# Patient Record
Sex: Female | Born: 1970 | Race: White | Hispanic: No | Smoking: Former smoker
Health system: Southern US, Community
[De-identification: ages and names within clinical notes are randomized; demographics above are authoritative.]

## PROBLEM LIST (undated history)

## (undated) DIAGNOSIS — M1990 Unspecified osteoarthritis, unspecified site: Secondary | ICD-10-CM

## (undated) DIAGNOSIS — R112 Nausea with vomiting, unspecified: Secondary | ICD-10-CM

## (undated) DIAGNOSIS — M329 Systemic lupus erythematosus, unspecified: Secondary | ICD-10-CM

## (undated) DIAGNOSIS — R011 Cardiac murmur, unspecified: Secondary | ICD-10-CM

## (undated) DIAGNOSIS — R55 Syncope and collapse: Secondary | ICD-10-CM

## (undated) DIAGNOSIS — F1120 Opioid dependence, uncomplicated: Secondary | ICD-10-CM

## (undated) DIAGNOSIS — N390 Urinary tract infection, site not specified: Principal | ICD-10-CM

## (undated) DIAGNOSIS — F419 Anxiety disorder, unspecified: Secondary | ICD-10-CM

## (undated) DIAGNOSIS — R000 Tachycardia, unspecified: Secondary | ICD-10-CM

## (undated) DIAGNOSIS — B019 Varicella without complication: Secondary | ICD-10-CM

## (undated) DIAGNOSIS — F4310 Post-traumatic stress disorder, unspecified: Secondary | ICD-10-CM

## (undated) DIAGNOSIS — R51 Headache: Secondary | ICD-10-CM

## (undated) DIAGNOSIS — I10 Essential (primary) hypertension: Secondary | ICD-10-CM

## (undated) DIAGNOSIS — Z9889 Other specified postprocedural states: Secondary | ICD-10-CM

## (undated) DIAGNOSIS — F410 Panic disorder [episodic paroxysmal anxiety] without agoraphobia: Secondary | ICD-10-CM

## (undated) DIAGNOSIS — F329 Major depressive disorder, single episode, unspecified: Secondary | ICD-10-CM

## (undated) HISTORY — DX: Tachycardia, unspecified: R00.0

## (undated) HISTORY — DX: Panic disorder (episodic paroxysmal anxiety): F41.0

## (undated) HISTORY — DX: Cardiac murmur, unspecified: R01.1

## (undated) HISTORY — DX: Varicella without complication: B01.9

## (undated) HISTORY — DX: Post-traumatic stress disorder, unspecified: F43.10

## (undated) HISTORY — DX: Urinary tract infection, site not specified: N39.0

## (undated) HISTORY — DX: Headache: R51

## (undated) HISTORY — DX: Anxiety disorder, unspecified: F41.9

## (undated) HISTORY — DX: Major depressive disorder, single episode, unspecified: F32.9

## (undated) HISTORY — DX: Systemic lupus erythematosus, unspecified: M32.9

## (undated) HISTORY — DX: Opioid dependence, uncomplicated: F11.20

## (undated) HISTORY — DX: Essential (primary) hypertension: I10

---

## 1993-01-30 HISTORY — PX: BREAST SURGERY: SHX581

## 1997-09-20 ENCOUNTER — Emergency Department (HOSPITAL_COMMUNITY): Admission: EM | Admit: 1997-09-20 | Discharge: 1997-09-20 | Payer: Self-pay | Admitting: Emergency Medicine

## 1999-02-16 ENCOUNTER — Inpatient Hospital Stay (HOSPITAL_COMMUNITY): Admission: AD | Admit: 1999-02-16 | Discharge: 1999-02-18 | Payer: Self-pay | Admitting: Psychiatry

## 1999-11-03 ENCOUNTER — Ambulatory Visit (HOSPITAL_COMMUNITY): Admission: RE | Admit: 1999-11-03 | Discharge: 1999-11-03 | Payer: Self-pay | Admitting: Family Medicine

## 2000-01-31 DIAGNOSIS — R55 Syncope and collapse: Secondary | ICD-10-CM

## 2000-01-31 HISTORY — DX: Syncope and collapse: R55

## 2000-01-31 HISTORY — PX: TONSILLECTOMY AND ADENOIDECTOMY: SHX28

## 2001-02-01 ENCOUNTER — Encounter: Payer: Self-pay | Admitting: Family Medicine

## 2001-02-01 ENCOUNTER — Ambulatory Visit (HOSPITAL_COMMUNITY): Admission: RE | Admit: 2001-02-01 | Discharge: 2001-02-01 | Payer: Self-pay | Admitting: Family Medicine

## 2002-01-30 DIAGNOSIS — M329 Systemic lupus erythematosus, unspecified: Secondary | ICD-10-CM

## 2002-01-30 HISTORY — DX: Systemic lupus erythematosus, unspecified: M32.9

## 2002-10-15 ENCOUNTER — Encounter: Payer: Self-pay | Admitting: Gastroenterology

## 2002-10-15 ENCOUNTER — Encounter: Admission: RE | Admit: 2002-10-15 | Discharge: 2002-10-15 | Payer: Self-pay | Admitting: Gastroenterology

## 2002-11-05 ENCOUNTER — Emergency Department (HOSPITAL_COMMUNITY): Admission: EM | Admit: 2002-11-05 | Discharge: 2002-11-06 | Payer: Self-pay | Admitting: Emergency Medicine

## 2002-11-14 ENCOUNTER — Ambulatory Visit (HOSPITAL_COMMUNITY): Admission: RE | Admit: 2002-11-14 | Discharge: 2002-11-15 | Payer: Self-pay | Admitting: Surgery

## 2002-11-14 ENCOUNTER — Encounter (INDEPENDENT_AMBULATORY_CARE_PROVIDER_SITE_OTHER): Payer: Self-pay | Admitting: *Deleted

## 2002-11-14 ENCOUNTER — Encounter: Payer: Self-pay | Admitting: Surgery

## 2003-01-31 HISTORY — PX: CHOLECYSTECTOMY: SHX55

## 2004-06-07 ENCOUNTER — Inpatient Hospital Stay (HOSPITAL_COMMUNITY): Admission: AC | Admit: 2004-06-07 | Discharge: 2004-06-09 | Payer: Self-pay

## 2004-07-19 ENCOUNTER — Ambulatory Visit: Payer: Self-pay | Admitting: Internal Medicine

## 2004-07-26 ENCOUNTER — Ambulatory Visit: Payer: Self-pay | Admitting: Internal Medicine

## 2004-07-26 ENCOUNTER — Ambulatory Visit (HOSPITAL_COMMUNITY): Admission: RE | Admit: 2004-07-26 | Discharge: 2004-07-26 | Payer: Self-pay | Admitting: Internal Medicine

## 2004-08-18 ENCOUNTER — Ambulatory Visit: Payer: Self-pay | Admitting: Internal Medicine

## 2004-08-20 ENCOUNTER — Emergency Department (HOSPITAL_COMMUNITY): Admission: EM | Admit: 2004-08-20 | Discharge: 2004-08-21 | Payer: Self-pay | Admitting: Emergency Medicine

## 2005-01-08 ENCOUNTER — Emergency Department (HOSPITAL_COMMUNITY): Admission: AD | Admit: 2005-01-08 | Discharge: 2005-01-08 | Payer: Self-pay | Admitting: Family Medicine

## 2005-04-08 ENCOUNTER — Emergency Department (HOSPITAL_COMMUNITY): Admission: EM | Admit: 2005-04-08 | Discharge: 2005-04-08 | Payer: Self-pay | Admitting: Emergency Medicine

## 2005-04-15 ENCOUNTER — Emergency Department (HOSPITAL_COMMUNITY): Admission: EM | Admit: 2005-04-15 | Discharge: 2005-04-15 | Payer: Self-pay | Admitting: Family Medicine

## 2005-06-25 ENCOUNTER — Inpatient Hospital Stay (HOSPITAL_COMMUNITY): Admission: EM | Admit: 2005-06-25 | Discharge: 2005-06-28 | Payer: Self-pay | Admitting: Psychiatry

## 2005-06-25 ENCOUNTER — Emergency Department (HOSPITAL_COMMUNITY): Admission: EM | Admit: 2005-06-25 | Discharge: 2005-06-25 | Payer: Self-pay | Admitting: Emergency Medicine

## 2005-06-26 ENCOUNTER — Ambulatory Visit: Payer: Self-pay | Admitting: Psychiatry

## 2005-10-26 ENCOUNTER — Ambulatory Visit: Payer: Self-pay | Admitting: Cardiology

## 2005-11-07 ENCOUNTER — Ambulatory Visit: Payer: Self-pay | Admitting: Internal Medicine

## 2005-11-07 ENCOUNTER — Ambulatory Visit: Payer: Self-pay

## 2005-11-22 ENCOUNTER — Ambulatory Visit: Payer: Self-pay | Admitting: Internal Medicine

## 2005-11-29 ENCOUNTER — Ambulatory Visit (HOSPITAL_BASED_OUTPATIENT_CLINIC_OR_DEPARTMENT_OTHER): Admission: RE | Admit: 2005-11-29 | Discharge: 2005-11-29 | Payer: Self-pay | Admitting: *Deleted

## 2006-01-30 HISTORY — PX: OTHER SURGICAL HISTORY: SHX169

## 2007-08-21 ENCOUNTER — Inpatient Hospital Stay (HOSPITAL_COMMUNITY): Admission: EM | Admit: 2007-08-21 | Discharge: 2007-08-24 | Payer: Self-pay | Admitting: Emergency Medicine

## 2008-01-31 HISTORY — PX: ABDOMINAL HYSTERECTOMY: SHX81

## 2008-06-23 ENCOUNTER — Emergency Department (HOSPITAL_BASED_OUTPATIENT_CLINIC_OR_DEPARTMENT_OTHER): Admission: EM | Admit: 2008-06-23 | Discharge: 2008-06-23 | Payer: Self-pay | Admitting: Emergency Medicine

## 2008-06-23 ENCOUNTER — Ambulatory Visit: Payer: Self-pay | Admitting: Radiology

## 2008-11-18 ENCOUNTER — Emergency Department (HOSPITAL_BASED_OUTPATIENT_CLINIC_OR_DEPARTMENT_OTHER): Admission: EM | Admit: 2008-11-18 | Discharge: 2008-11-19 | Payer: Self-pay | Admitting: Emergency Medicine

## 2008-12-24 ENCOUNTER — Emergency Department (HOSPITAL_COMMUNITY): Admission: EM | Admit: 2008-12-24 | Discharge: 2008-12-24 | Payer: Self-pay | Admitting: Emergency Medicine

## 2009-01-11 ENCOUNTER — Ambulatory Visit (HOSPITAL_COMMUNITY): Admission: RE | Admit: 2009-01-11 | Discharge: 2009-01-12 | Payer: Self-pay | Admitting: Obstetrics and Gynecology

## 2009-01-23 ENCOUNTER — Inpatient Hospital Stay (HOSPITAL_COMMUNITY): Admission: AD | Admit: 2009-01-23 | Discharge: 2009-01-23 | Payer: Self-pay | Admitting: Obstetrics and Gynecology

## 2009-03-05 ENCOUNTER — Ambulatory Visit: Payer: Self-pay | Admitting: Diagnostic Radiology

## 2009-03-05 ENCOUNTER — Emergency Department (HOSPITAL_BASED_OUTPATIENT_CLINIC_OR_DEPARTMENT_OTHER): Admission: EM | Admit: 2009-03-05 | Discharge: 2009-03-05 | Payer: Self-pay | Admitting: Emergency Medicine

## 2009-05-07 ENCOUNTER — Emergency Department (HOSPITAL_BASED_OUTPATIENT_CLINIC_OR_DEPARTMENT_OTHER): Admission: EM | Admit: 2009-05-07 | Discharge: 2009-05-07 | Payer: Self-pay | Admitting: Emergency Medicine

## 2009-08-14 ENCOUNTER — Inpatient Hospital Stay (HOSPITAL_COMMUNITY): Admission: EM | Admit: 2009-08-14 | Discharge: 2009-08-17 | Payer: Self-pay | Admitting: Emergency Medicine

## 2009-11-02 ENCOUNTER — Emergency Department (HOSPITAL_BASED_OUTPATIENT_CLINIC_OR_DEPARTMENT_OTHER): Admission: EM | Admit: 2009-11-02 | Discharge: 2009-11-02 | Payer: Self-pay | Admitting: Emergency Medicine

## 2009-11-02 ENCOUNTER — Ambulatory Visit: Payer: Self-pay | Admitting: Diagnostic Radiology

## 2009-11-14 ENCOUNTER — Encounter: Admission: RE | Admit: 2009-11-14 | Discharge: 2009-11-14 | Payer: Self-pay | Admitting: Neurological Surgery

## 2010-01-04 ENCOUNTER — Emergency Department (HOSPITAL_COMMUNITY)
Admission: EM | Admit: 2010-01-04 | Discharge: 2010-01-04 | Payer: Self-pay | Source: Home / Self Care | Admitting: Emergency Medicine

## 2010-02-19 ENCOUNTER — Encounter: Payer: Self-pay | Admitting: Rheumatology

## 2010-02-20 ENCOUNTER — Encounter: Payer: Self-pay | Admitting: Rheumatology

## 2010-03-25 ENCOUNTER — Emergency Department (HOSPITAL_BASED_OUTPATIENT_CLINIC_OR_DEPARTMENT_OTHER)
Admission: EM | Admit: 2010-03-25 | Discharge: 2010-03-25 | Disposition: A | Discharge status: Home / Self Care | Payer: BC Managed Care – PPO | Attending: Emergency Medicine | Admitting: Emergency Medicine

## 2010-03-25 DIAGNOSIS — R509 Fever, unspecified: Secondary | ICD-10-CM | POA: Insufficient documentation

## 2010-03-25 DIAGNOSIS — J3489 Other specified disorders of nose and nasal sinuses: Secondary | ICD-10-CM | POA: Insufficient documentation

## 2010-03-25 DIAGNOSIS — M2550 Pain in unspecified joint: Secondary | ICD-10-CM | POA: Insufficient documentation

## 2010-03-25 DIAGNOSIS — H9210 Otorrhea, unspecified ear: Secondary | ICD-10-CM | POA: Insufficient documentation

## 2010-03-25 DIAGNOSIS — R059 Cough, unspecified: Secondary | ICD-10-CM | POA: Insufficient documentation

## 2010-03-25 DIAGNOSIS — M329 Systemic lupus erythematosus, unspecified: Secondary | ICD-10-CM | POA: Insufficient documentation

## 2010-03-25 DIAGNOSIS — R05 Cough: Secondary | ICD-10-CM | POA: Insufficient documentation

## 2010-03-25 DIAGNOSIS — F3289 Other specified depressive episodes: Secondary | ICD-10-CM | POA: Insufficient documentation

## 2010-03-25 DIAGNOSIS — F329 Major depressive disorder, single episode, unspecified: Secondary | ICD-10-CM | POA: Insufficient documentation

## 2010-03-25 DIAGNOSIS — H9190 Unspecified hearing loss, unspecified ear: Secondary | ICD-10-CM | POA: Insufficient documentation

## 2010-03-25 DIAGNOSIS — Z79899 Other long term (current) drug therapy: Secondary | ICD-10-CM | POA: Insufficient documentation

## 2010-03-25 DIAGNOSIS — H9209 Otalgia, unspecified ear: Secondary | ICD-10-CM | POA: Insufficient documentation

## 2010-03-25 DIAGNOSIS — H7290 Unspecified perforation of tympanic membrane, unspecified ear: Secondary | ICD-10-CM | POA: Insufficient documentation

## 2010-03-25 DIAGNOSIS — J329 Chronic sinusitis, unspecified: Secondary | ICD-10-CM | POA: Insufficient documentation

## 2010-04-11 LAB — URINALYSIS, ROUTINE W REFLEX MICROSCOPIC
Bilirubin Urine: NEGATIVE
Hgb urine dipstick: NEGATIVE
Ketones, ur: NEGATIVE mg/dL
Nitrite: NEGATIVE
Urobilinogen, UA: 1 mg/dL (ref 0.0–1.0)
pH: 6.5 (ref 5.0–8.0)

## 2010-04-11 LAB — COMPREHENSIVE METABOLIC PANEL
AST: 44 U/L — ABNORMAL HIGH (ref 0–37)
Albumin: 4 g/dL (ref 3.5–5.2)
Alkaline Phosphatase: 61 U/L (ref 39–117)
Chloride: 105 mEq/L (ref 96–112)
GFR calc Af Amer: >60 mL/min (ref >60)
Potassium: 4.1 mEq/L (ref 3.5–5.1)
Total Bilirubin: 0.5 mg/dL (ref 0.3–1.2)

## 2010-04-11 LAB — CBC
Platelets: 179 10*3/uL (ref 150–400)
RBC: 4.07 MIL/uL (ref 3.87–5.11)
WBC: 6.3 10*3/uL (ref 4.0–10.5)

## 2010-04-11 LAB — DIFFERENTIAL
Basophils Absolute: 0 10*3/uL (ref 0.0–0.1)
Basophils Relative: 0 % (ref 0–1)
Eosinophils Relative: 1 % (ref 0–5)
Lymphocytes Relative: 30 % (ref 12–46)
Monocytes Absolute: 0.6 10*3/uL (ref 0.1–1.0)

## 2010-04-16 LAB — COMPREHENSIVE METABOLIC PANEL
AST: 19 U/L (ref 0–37)
AST: 21 U/L (ref 0–37)
Albumin: 3.1 g/dL — ABNORMAL LOW (ref 3.5–5.2)
Alkaline Phosphatase: 49 U/L (ref 39–117)
BUN: 10 mg/dL (ref 6–23)
BUN: 5 mg/dL — ABNORMAL LOW (ref 6–23)
CO2: 25 mEq/L (ref 19–32)
Calcium: 9 mg/dL (ref 8.4–10.5)
Chloride: 111 mEq/L (ref 96–112)
Creatinine, Ser: 0.69 mg/dL (ref 0.4–1.2)
Creatinine, Ser: 0.74 mg/dL (ref 0.4–1.2)
GFR calc Af Amer: >60 mL/min (ref >60)
GFR calc Af Amer: >60 mL/min (ref >60)
GFR calc non Af Amer: >60 mL/min (ref >60)
Glucose, Bld: 109 mg/dL — ABNORMAL HIGH (ref 70–99)
Potassium: 3.9 mEq/L (ref 3.5–5.1)
Total Bilirubin: 0.4 mg/dL (ref 0.3–1.2)
Total Bilirubin: 0.6 mg/dL (ref 0.3–1.2)
Total Protein: 5.4 g/dL — ABNORMAL LOW (ref 6.0–8.3)

## 2010-04-16 LAB — URINALYSIS, ROUTINE W REFLEX MICROSCOPIC
Bilirubin Urine: NEGATIVE
Hgb urine dipstick: NEGATIVE
Ketones, ur: NEGATIVE mg/dL
Nitrite: NEGATIVE
pH: 5 (ref 5.0–8.0)

## 2010-04-16 LAB — CBC
HCT: 36.6 % (ref 36.0–46.0)
Hemoglobin: 12.5 g/dL (ref 12.0–15.0)
Hemoglobin: 13.4 g/dL (ref 12.0–15.0)
MCH: 31.3 pg (ref 26.0–34.0)
MCH: 31.4 pg (ref 26.0–34.0)
MCHC: 34.1 g/dL (ref 30.0–36.0)
MCHC: 34.1 g/dL (ref 30.0–36.0)
MCV: 92.1 fL (ref 78.0–100.0)
Platelets: 190 10*3/uL (ref 150–400)
RBC: 3.69 MIL/uL — ABNORMAL LOW (ref 3.87–5.11)
RDW: 13.3 % (ref 11.5–15.5)
RDW: 13.6 % (ref 11.5–15.5)
WBC: 5.4 10*3/uL (ref 4.0–10.5)

## 2010-04-16 LAB — DIFFERENTIAL
Basophils Absolute: 0 10*3/uL (ref 0.0–0.1)
Lymphocytes Relative: 25 % (ref 12–46)
Lymphs Abs: 1.1 10*3/uL (ref 0.7–4.0)
Neutro Abs: 2.9 10*3/uL (ref 1.7–7.7)
Neutrophils Relative %: 67 % (ref 43–77)

## 2010-04-16 LAB — BASIC METABOLIC PANEL
BUN: 7 mg/dL (ref 6–23)
CO2: 24 mEq/L (ref 19–32)
GFR calc non Af Amer: >60 mL/min (ref >60)
Glucose, Bld: 95 mg/dL (ref 70–99)
Potassium: 4.5 mEq/L (ref 3.5–5.1)
Sodium: 139 mEq/L (ref 135–145)

## 2010-04-16 LAB — POCT PREGNANCY, URINE: Preg Test, Ur: NEGATIVE

## 2010-04-16 LAB — LIPASE, BLOOD: Lipase: 27 U/L (ref 11–59)

## 2010-04-20 LAB — COMPREHENSIVE METABOLIC PANEL
Albumin: 5 g/dL (ref 3.5–5.2)
Alkaline Phosphatase: 60 U/L (ref 39–117)
BUN: 14 mg/dL (ref 6–23)
CO2: 28 mEq/L (ref 19–32)
Chloride: 101 mEq/L (ref 96–112)
Creatinine, Ser: 0.8 mg/dL (ref 0.4–1.2)
GFR calc non Af Amer: >60 mL/min (ref >60)
Glucose, Bld: 76 mg/dL (ref 70–99)
Potassium: 4.2 mEq/L (ref 3.5–5.1)
Total Bilirubin: 0.4 mg/dL (ref 0.3–1.2)

## 2010-04-20 LAB — CBC
HCT: 41.4 % (ref 36.0–46.0)
Hemoglobin: 14.1 g/dL (ref 12.0–15.0)
MCV: 92.5 fL (ref 78.0–100.0)
Platelets: 231 10*3/uL (ref 150–400)
RBC: 4.48 MIL/uL (ref 3.87–5.11)
WBC: 8.8 10*3/uL (ref 4.0–10.5)

## 2010-04-20 LAB — DIFFERENTIAL
Basophils Absolute: 0.1 10*3/uL (ref 0.0–0.1)
Basophils Relative: 1 % (ref 0–1)
Eosinophils Relative: 0 % (ref 0–5)
Monocytes Absolute: 0.7 10*3/uL (ref 0.1–1.0)
Neutro Abs: 5.9 10*3/uL (ref 1.7–7.7)

## 2010-04-20 LAB — URINE MICROSCOPIC-ADD ON

## 2010-04-20 LAB — URINALYSIS, ROUTINE W REFLEX MICROSCOPIC
Bilirubin Urine: NEGATIVE
Glucose, UA: NEGATIVE mg/dL
Hgb urine dipstick: NEGATIVE
Ketones, ur: NEGATIVE mg/dL
Protein, ur: NEGATIVE mg/dL
Urobilinogen, UA: 0.2 mg/dL (ref 0.0–1.0)

## 2010-04-20 LAB — URINE CULTURE

## 2010-04-20 LAB — POCT CARDIAC MARKERS
Myoglobin, poc: 67.4 ng/mL (ref 12–200)
Troponin i, poc: <0.05 ng/mL (ref 0.00–0.09)

## 2010-04-20 LAB — CK: Total CK: 112 U/L (ref 7–177)

## 2010-04-24 ENCOUNTER — Emergency Department (HOSPITAL_COMMUNITY)
Admission: EM | Admit: 2010-04-24 | Discharge: 2010-04-25 | Disposition: A | Discharge status: Home / Self Care | Payer: BC Managed Care – PPO | Attending: Emergency Medicine | Admitting: Emergency Medicine

## 2010-04-24 DIAGNOSIS — R12 Heartburn: Secondary | ICD-10-CM | POA: Insufficient documentation

## 2010-04-24 DIAGNOSIS — F329 Major depressive disorder, single episode, unspecified: Secondary | ICD-10-CM | POA: Insufficient documentation

## 2010-04-24 DIAGNOSIS — F3289 Other specified depressive episodes: Secondary | ICD-10-CM | POA: Insufficient documentation

## 2010-04-24 DIAGNOSIS — R1032 Left lower quadrant pain: Secondary | ICD-10-CM | POA: Insufficient documentation

## 2010-04-24 DIAGNOSIS — N949 Unspecified condition associated with female genital organs and menstrual cycle: Secondary | ICD-10-CM | POA: Insufficient documentation

## 2010-04-24 DIAGNOSIS — M329 Systemic lupus erythematosus, unspecified: Secondary | ICD-10-CM | POA: Insufficient documentation

## 2010-04-24 DIAGNOSIS — R110 Nausea: Secondary | ICD-10-CM | POA: Insufficient documentation

## 2010-04-24 LAB — BASIC METABOLIC PANEL
CO2: 24 mEq/L (ref 19–32)
Calcium: 8.9 mg/dL (ref 8.4–10.5)
Chloride: 108 mEq/L (ref 96–112)
GFR calc Af Amer: >60 mL/min (ref >60)
Sodium: 137 mEq/L (ref 135–145)

## 2010-04-24 LAB — URINALYSIS, ROUTINE W REFLEX MICROSCOPIC
Bilirubin Urine: NEGATIVE
Ketones, ur: NEGATIVE mg/dL
Nitrite: NEGATIVE
Urobilinogen, UA: 0.2 mg/dL (ref 0.0–1.0)

## 2010-04-24 LAB — DIFFERENTIAL
Basophils Absolute: 0 10*3/uL (ref 0.0–0.1)
Basophils Relative: 0 % (ref 0–1)
Eosinophils Absolute: 0.1 10*3/uL (ref 0.0–0.7)
Eosinophils Relative: 1 % (ref 0–5)
Monocytes Absolute: 0.7 10*3/uL (ref 0.1–1.0)

## 2010-04-24 LAB — PREGNANCY, URINE: Preg Test, Ur: NEGATIVE

## 2010-04-24 LAB — CBC
Hemoglobin: 13 g/dL (ref 12.0–15.0)
RBC: 4.25 MIL/uL (ref 3.87–5.11)
WBC: 6.9 10*3/uL (ref 4.0–10.5)

## 2010-04-25 ENCOUNTER — Emergency Department (HOSPITAL_COMMUNITY): Payer: BC Managed Care – PPO

## 2010-04-25 LAB — OCCULT BLOOD, POC DEVICE: Fecal Occult Bld: NEGATIVE

## 2010-04-25 LAB — WET PREP, GENITAL
Clue Cells Wet Prep HPF POC: NONE SEEN
Trich, Wet Prep: NONE SEEN

## 2010-04-26 LAB — GC/CHLAMYDIA PROBE AMP, GENITAL: GC Probe Amp, Genital: NEGATIVE

## 2010-05-02 LAB — URINE CULTURE: Colony Count: 35000

## 2010-05-02 LAB — URINALYSIS, ROUTINE W REFLEX MICROSCOPIC
Bilirubin Urine: NEGATIVE
Specific Gravity, Urine: 1.02 (ref 1.005–1.030)
Urobilinogen, UA: 0.2 mg/dL (ref 0.0–1.0)

## 2010-05-02 LAB — DIFFERENTIAL
Eosinophils Absolute: 0 10*3/uL (ref 0.0–0.7)
Lymphocytes Relative: 24 % (ref 12–46)
Lymphs Abs: 2.7 10*3/uL (ref 0.7–4.0)
Monocytes Relative: 10 % (ref 3–12)
Neutro Abs: 7.3 10*3/uL (ref 1.7–7.7)
Neutrophils Relative %: 66 % (ref 43–77)

## 2010-05-02 LAB — CBC
MCV: 92.6 fL (ref 78.0–100.0)
RBC: 3.86 MIL/uL — ABNORMAL LOW (ref 3.87–5.11)
WBC: 11.2 10*3/uL — ABNORMAL HIGH (ref 4.0–10.5)

## 2010-05-02 LAB — URINE MICROSCOPIC-ADD ON

## 2010-05-03 LAB — CBC
HCT: 28.2 % — ABNORMAL LOW (ref 36.0–46.0)
Hemoglobin: 9.5 g/dL — ABNORMAL LOW (ref 12.0–15.0)
Platelets: 211 10*3/uL (ref 150–400)
Platelets: 269 10*3/uL (ref 150–400)
RDW: 15.6 % — ABNORMAL HIGH (ref 11.5–15.5)
WBC: 11.9 10*3/uL — ABNORMAL HIGH (ref 4.0–10.5)

## 2010-05-03 LAB — COMPREHENSIVE METABOLIC PANEL
ALT: 36 U/L — ABNORMAL HIGH (ref 0–35)
Albumin: 4.4 g/dL (ref 3.5–5.2)
Alkaline Phosphatase: 56 U/L (ref 39–117)
Calcium: 9.6 mg/dL (ref 8.4–10.5)
Potassium: 4 mEq/L (ref 3.5–5.1)
Sodium: 137 mEq/L (ref 135–145)
Total Protein: 7.8 g/dL (ref 6.0–8.3)

## 2010-05-03 LAB — TYPE AND SCREEN: ABO/RH(D): A POS

## 2010-05-03 LAB — ABO/RH: ABO/RH(D): A POS

## 2010-05-04 LAB — CBC
HCT: 33.2 % — ABNORMAL LOW (ref 36.0–46.0)
Hemoglobin: 11.2 g/dL — ABNORMAL LOW (ref 12.0–15.0)
MCHC: 33.7 g/dL (ref 30.0–36.0)
MCV: 91.8 fL (ref 78.0–100.0)
RBC: 3.61 MIL/uL — ABNORMAL LOW (ref 3.87–5.11)
RDW: 15.1 % (ref 11.5–15.5)

## 2010-05-04 LAB — DIFFERENTIAL
Basophils Absolute: 0 10*3/uL (ref 0.0–0.1)
Eosinophils Relative: 0 % (ref 0–5)
Lymphocytes Relative: 31 % (ref 12–46)
Lymphs Abs: 1.9 10*3/uL (ref 0.7–4.0)
Neutro Abs: 3.7 10*3/uL (ref 1.7–7.7)
Neutrophils Relative %: 61 % (ref 43–77)

## 2010-05-04 LAB — URINALYSIS, ROUTINE W REFLEX MICROSCOPIC
Hgb urine dipstick: NEGATIVE
Protein, ur: NEGATIVE mg/dL
Specific Gravity, Urine: 1.023 (ref 1.005–1.030)
Urobilinogen, UA: 1 mg/dL (ref 0.0–1.0)

## 2010-05-04 LAB — COMPREHENSIVE METABOLIC PANEL
ALT: 28 U/L (ref 0–35)
BUN: 9 mg/dL (ref 6–23)
CO2: 23 mEq/L (ref 19–32)
Calcium: 8.6 mg/dL (ref 8.4–10.5)
Creatinine, Ser: 0.64 mg/dL (ref 0.4–1.2)
GFR calc non Af Amer: >60 mL/min (ref >60)
Glucose, Bld: 93 mg/dL (ref 70–99)
Sodium: 138 mEq/L (ref 135–145)
Total Protein: 6.4 g/dL (ref 6.0–8.3)

## 2010-05-04 LAB — LIPASE, BLOOD: Lipase: 20 U/L (ref 11–59)

## 2010-05-04 LAB — GC/CHLAMYDIA PROBE AMP, GENITAL: GC Probe Amp, Genital: NEGATIVE

## 2010-05-04 LAB — WET PREP, GENITAL
Trich, Wet Prep: NONE SEEN
Yeast Wet Prep HPF POC: NONE SEEN

## 2010-05-05 LAB — PREGNANCY, URINE: Preg Test, Ur: NEGATIVE

## 2010-05-10 LAB — COMPREHENSIVE METABOLIC PANEL
ALT: 36 U/L — ABNORMAL HIGH (ref 0–35)
AST: 41 U/L — ABNORMAL HIGH (ref 0–37)
Alkaline Phosphatase: 103 U/L (ref 39–117)
CO2: 26 mEq/L (ref 19–32)
Calcium: 9.4 mg/dL (ref 8.4–10.5)
GFR calc Af Amer: >60 mL/min (ref >60)
GFR calc non Af Amer: >60 mL/min (ref >60)
Potassium: 3.7 mEq/L (ref 3.5–5.1)
Sodium: 143 mEq/L (ref 135–145)
Total Protein: 9.3 g/dL — ABNORMAL HIGH (ref 6.0–8.3)

## 2010-05-10 LAB — DIFFERENTIAL
Basophils Relative: 0 % (ref 0–1)
Eosinophils Absolute: 0 10*3/uL (ref 0.0–0.7)
Eosinophils Relative: 1 % (ref 0–5)
Lymphs Abs: 2.7 10*3/uL (ref 0.7–4.0)
Monocytes Relative: 10 % (ref 3–12)

## 2010-05-10 LAB — URINALYSIS, ROUTINE W REFLEX MICROSCOPIC
Bilirubin Urine: NEGATIVE
Hgb urine dipstick: NEGATIVE
Specific Gravity, Urine: 1.004 — ABNORMAL LOW (ref 1.005–1.030)
pH: 6.5 (ref 5.0–8.0)

## 2010-05-10 LAB — CBC
MCHC: 33.3 g/dL (ref 30.0–36.0)
RBC: 4.51 MIL/uL (ref 3.87–5.11)
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
## 2010-06-14 NOTE — Procedures (Signed)
EEG NUMBER:  [id number].   CLINICAL HISTORY:  The patient is a 40 year old admitted with syncope.  She has lupus, cardiomegaly, syncope, and also complained of inability  to move her right arm.  (780.2)   PROCEDURE:  The tracing is carried out on a 32-channel digital Cadwell  recorder reformatted into 16 channel montages with one devoted to EKG.  The patient was awake and drowsy during the recording.  The  international 10/20 system lead placement was used. Medications include  Toradol, morphine, Lovenox, aspirin, Protonix, methadone, Toprol,  Plaquenil, Phenergan, Zofran, Ambien, Xanax, and Robitussin.   DESCRIPTION OF FINDINGS:  Dominant frequency is an 8 Hz, 30-60 microvolt  alpha range activity, this is well modulated and regulated.  Background  activity is mixed frequency predominately alpha and beta range  components.  The patient becomes drowsy with mixed frequency theta range  activity and frontally predominant beta range components.  Light natural  sleep was not achieved.   Photic stimulation induced a driving response between 9 and 13 Hz.   EKG showed a regular sinus rhythm with ventricular response of 90 beats  per minute.   IMPRESSION:  Normal record with the patient awake and drowsy.      Deanna Artis. Sharene Skeans, M.D.  Electronically Signed     ZOX:WRUE  D:  08/22/2007 23:39:30  T:  08/23/2007 10:49:29  Job #:  [id number]   cc:   [facility]
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
## 2010-06-17 NOTE — Assessment & Plan Note (Signed)
[facility]                           ELECTROPHYSIOLOGY OFFICE NOTE   FIORELA, PELZER                  MRN:          [id number]  DATE:11/22/2005                            DOB:          19-Dec-1970    HISTORY OF PRESENT ILLNESS:  [name] returns today for follow up.  She  is a very pleasant 40 year old woman with history of syncope and negative  tilt table test who also has fairly severe hypertension.  She was seen in  our office back last month with palpitations and chest discomfort as well as  elevation in her blood pressure.  She underwent multiple testing including  cardiac monitoring which demonstrated sinus tach versus atrial tach at rates  of up to 130 beats per minute but also demonstrated an average heart rate of  79 beats per minute.  There were no long pauses.  The patient underwent,  because of her prolonged and severe hypertension, evaluation with 24 hour  metanephrine's and catecholamines which demonstrated no evidence of a pheo.  The patient finally underwent stress perfusion imaging which demonstrated no  ischemia and normal LV function.   PHYSICAL EXAMINATION:  GENERAL:  Pleasant, well-appearing woman in no acute  distress.  VITAL SIGNS:  Blood pressure 128/86, pulse 76 and regular, respirations 18,  weight 149 pounds.  NECK:  No JVD.  LUNGS:  Clear to bilaterally to auscultation.  CARDIOVASCULAR:  Regular rate and rhythm with normal S1, S2.  EXTREMITIES:  No cyanosis, clubbing or edema.  Pulses were 2+ and symmetric.   STUDIES:  EKG demonstrates sinus rhythm with normal axis and intervals.   IMPRESSION:  1. Syncope, almost certainly due to a neurally mediated source.  2. Hypertension, well controlled now on Toprol.  3. Palpitations with probable sinus tachycardia.   DISCUSSION:  I discussed the benign nature of the problems with her, and  that should she feel dizzy or lightheaded, she should sit down  immediately.  For now, she will continue on her  present medical therapy.  If she has recurrent problems with syncope, then I  would be happy to share back.  Otherwise, we will see her back in one year.            ______________________________  Doylene Canning. Ladona Ridgel, MD   GWT/MedQ DD:  11/22/2005 DT:  11/23/2005 Job #:  [id number]   cc:   Kirk Ruths, M.D.

## 2010-06-17 NOTE — Op Note (Signed)
Rebecca Boyle, Rebecca Boyle           ACCOUNT NO.:  [id number]   MEDICAL RECORD NO.:  [id number]          PATIENT TYPE:  AMB   LOCATION:  DSC                          FACILITY:  [facility]   PHYSICIAN:  Lowell Bouton, M.D.DATE OF BIRTH:  [birth year]   DATE OF PROCEDURE:  11/29/2005  DATE OF DISCHARGE:                                 OPERATIVE REPORT   PREOPERATIVE DIAGNOSIS:  Ulnar nerve laceration right wrist.   POSTOPERATIVE DIAGNOSIS:  Contusion ulnar nerve right wrist.   PROCEDURE:  External epineurolysis ulnar nerve right wrist.   SURGEON:  Lowell Bouton, M.D.   ANESTHESIA:  General.   OPERATIVE FINDINGS:  The patient had a small hematoma in the epineurium of  the ulnar nerve without division of any fascicles.  The level was at the  distal forearm and wrist.  The ulnar artery was intact, as were all the  flexor tendons in the region.   PROCEDURE:  Under general anesthesia, with a tourniquet on the right arm,  the right hand was prepped and draped in the usual fashion. After  exsanguinating the limb, the tourniquet was inflated to 250 mmHg.  A  longitudinal gently curved incision was made over the ulnar side of the  wrist and carried through the subcutaneous tissues.  Bleeding points were  coagulated.  Blunt dissection was carried down to the flexor carpi ulnaris  tendon which was retracted ulnarly.  There was no evidence of puncture of  the tendon.  The ulnar artery and nerve were then examined, and the artery  was intact.  The ulnar nerve was completely intact, although there was one  small area of hematoma where the nail had apparently touched the nerve.  The  nerve was then retracted, and the tendons were examined, and the fingers  were flexed and extended, and there was no evidence of perforation of the  tendons.  The microscope was then brought into the field, and the nerve was  examined, and the external epineurium was released.  The fascicles were all  intact, and there was no revision of any of the fascicles.  The wound was  then irrigated with saline.  One-half percent Marcaine was inserted in the  skin edges for pain control.  The subcutaneous tissue was closed with 4-0  Vicryl, and the skin with a 3-0 subcuticular Prolene.  Steri-Strips were  applied, followed by sterile dressings and a volar wrist splint. The patient  tolerated the procedure well and went to the recovery room, awake in stable  and condition.      Lowell Bouton, M.D.  Electronically Signed     EMM/MEDQ  D:  11/29/2005  T:  11/29/2005  Job:  [id number]   cc:   Patrica Duel, M.D.

## 2010-06-17 NOTE — Op Note (Signed)
NAME:  Rebecca Boyle, Rebecca Boyle                     ACCOUNT NO.:  [id number]   MEDICAL RECORD NO.:  [id number]                   PATIENT TYPE:  EMS   LOCATION:  MAJO                                 FACILITY:  [facility]   PHYSICIAN:  Abigail Miyamoto, M.D.              DATE OF BIRTH:  02/09/1970   DATE OF PROCEDURE:  11/14/2002  DATE OF DISCHARGE:  11/06/2002                                 OPERATIVE REPORT   PREOPERATIVE DIAGNOSIS:  Biliary dyskinesia.   POSTOPERATIVE DIAGNOSIS:  Biliary dyskinesia.   PROCEDURE:  Laparoscopic cholecystectomy with intraoperative cholangiogram.   SURGEON:  Abigail Miyamoto, M.D.   ANESTHESIA:  General endotracheal anesthesia and 0.25% Marcaine with  epinephrine.   ESTIMATED BLOOD LOSS:  Minimal.   FINDINGS:  The patient was found to have a normal cholangiogram.   DESCRIPTION OF PROCEDURE:  The patient was brought to the operating room and  identified as Rebecca Boyle.  She was placed supine on the operating  table and general anesthesia was induced.  Her abdomen was then prepped and  draped in the usual sterile fashion.  Using a #15 blade, a small vertical  incision was made below the umbilicus.  The incision was carried down to the  fascia which was opened with the scalpel and the scalpel was used to pass  through the peritoneal cavity.  Next, a 0 Vicryl pursestring suture was then  placed around the fascial opening.  This Hasson port was placed through the  open, and insufflation of the abdomen was begun.  Next, an 12 mm port was  placed in the patient's epigastrium and two 5 mm ports were placed in the  patient's right flank under direct vision.  The gallbladder was then and  retracted below the liver bed.  Dissection was then carried out at the base  of the gallbladder.  The cystic duct and cystic artery were easily dissected  out.  The cystic duct was clipped once distally.  The cystic artery was  clipped three times proximally and once  distally and transected with the  scissors.  The cystic duct was then partly opened with the laparoscopic  scissors.  The [facility] was then inserted straight through the  skin in the right upper quadrant.  The catheter was then placed through the  opening in the cystic duct.  A cholangiogram was then performed under direct  fluoroscopy with contrast.  Contrast was seen to flow into the entire  biliary system and duodenum without evidence of abnormality.  The  cholangiocatheter was then completely removed.  The cystic duct was then  clipped four times proximally and transected with the scissors.  The  gallbladder was then slowly  dissected free from the liver bed with the electrocautery.  Hemostasis was  then achieved in the liver bed with the electrocautery.  Once the  gallbladder was freed from the liver bed it was placed in the [facility] and  removed  through the incision at the umbilicus.  The 0 Vicryl at the  umbilicus was tied in place closing the fascial defect.  The abdomen was  then irrigated with normal saline.  Again hemostasis appeared to be  achieved.  All ports were then removed under direct vision, and the abdomen  was deflated.  All incisions were then anesthetized with 0.25%  Marcaine and closed with 4-0 Vicryl subcuticular sutures. Steri-Strips,  gauze and tape were then applied.  The patient tolerated the procedure well.  All sponge, needle and instrument counts were correct at the end of the  procedure.  The patient was then extubated in the operating room and taken  in stable condition to the recovery room.                                               Abigail Miyamoto, M.D.    DB/MEDQ  D:  11/14/2002  T:  11/14/2002  Job:  [id number]   cc:   Anselmo Rod, M.D.  [address]  [address]  [place]  [place] [address]  Fax: [phone]   Grandville Silos. [name], M.D.  [address] [place]  [place]  [place]  [address]  Fax: [phone]
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
## 2010-06-17 NOTE — H&P (Signed)
Rebecca Boyle, Rebecca Boyle           ACCOUNT NO.:  [id number]   MEDICAL RECORD NO.:  [id number]          PATIENT TYPE:  IPS   LOCATION:  0507                          [facility]ACILITY[facility]   PHYSICIAN:  Geoffery Lyons, M.D.      DATE OF BIRTH:  [birth year]   DATE OF ADMISSION:  06/25/2005  DATE OF DISCHARGE:                         PSYCHIATRIC ADMISSION ASSESSMENT   IDENTIFYING INFORMATION:  The patient is a 40 year old married white female  voluntarily admitted on Jun 25, 2005.   HISTORY OF PRESENT ILLNESS:  The patient presents with a history of  depression, passive suicidal thoughts.  The patient feels that she is a  burden to her family, she realized, due to her illness.  She states she is  in bed much of the time, again reporting chronic pain and relies on her two  children.  She states the 23 year old daughter snapped.  They had found  the patient's heart medicines in her child's room and child is currently  hospitalized for depression.  The patient states she has not been sleeping  for several nights in a row.  Her appetite has been satisfactory.  She does  report that she has been compliant with her medications.   PAST PSYCHIATRIC HISTORY:  This is the patient's second admission.  She was  here a few years ago for depressive symptoms.  Denies any suicidal attempts.  Sees Dr. Tomasa Rand for outpatient mental health services.   SOCIAL HISTORY:  This is a 40 year old married white female.  Married for 10  years.  Divorced her husband and remarried her husband again.  They have two  children, a 11 year old and a 55 year old.  She lives with her husband and  two children.  Unemployed.  Denies any legal issues.   FAMILY HISTORY:  Her mother is bipolar.   ALCOHOL/DRUG HISTORY:  The patient smokes.  Denies any drug use and is a  nondrinker.   PRIMARY CARE PHYSICIAN:  Dr. Lynda Rainwater, who is her rheumatologist in  [place].  The patient also attends the [facility].   MEDICAL PROBLEMS:  Lupus diagnosed in 2005.  She also has problems with  tachycardia.   MEDICATIONS:  Has been on Plaquenil, Toprol XL 50 mg, Depakote 500 mg, 2 at  bedtime, Ambien 10 mg q.h.s., Lamictal (unknown dose) and Xanax 0.5 mg to 1  mg t.i.d. p.r.n.  The patient also was on oxycodone 5 mg up to six times a  day.   ALLERGIES:  No known allergies.   PHYSICAL EXAMINATION:  The patient was assessed at [facility] Emergency  Department.  This is a young female, currently resting in bed, in no acute  distress.  Temperature 97.5, heart rate 90, respirations 18, blood pressure  103/62.  She is 164 pounds.   REVIEW OF SYSTEMS:  Significant for insomnia, depression, suicidal ideation  and chronic pain.  Also positive for increased heart rate.   LABORATORY DATA:  Her urine drug screen was positive for benzodiazepines.  CMET is within normal limits.  Pregnancy test was negative.  Urinalysis  shows 3-6 white blood cells.   MENTAL STATUS EXAM:  She is alert.  Again, resting in bed.  Cooperative.  Good eye contact.  Speech is clear, normal rate and tone.  The patient feels  guilty over her situation.  Affect is flat.  Thought processes are coherent,  goal directed.  No evidence of psychosis.  Cognitive function is intact.  Memory is good.  Judgment is good.  Insight is fair.   DIAGNOSES:  AXIS I:  Bipolar disorder, depressed.  AXIS II:  Deferred.  AXIS III:  Lupus, tachycardia.  AXIS IV:  Problems with primary support group, medical problems, other  psychosocial problems.  AXIS V:  Current 35.   PLAN:  To stabilize mood and thinking.  Will resume patient's medications.  Will check a Depakote level.  We will have a family session.  The patient to  follow up with Dr. Tomasa Rand and her therapist and also her pain clinic for  ongoing health issues.   TENTATIVE LENGTH OF STAY:  Five to six days.      Landry Corporal, N.P.      Geoffery Lyons, M.D.  Electronically Signed     JO/MEDQ  D:  06/28/2005  T:  06/28/2005  Job:  [id number]

## 2010-07-16 ENCOUNTER — Emergency Department (HOSPITAL_COMMUNITY): Payer: BC Managed Care – PPO

## 2010-07-16 ENCOUNTER — Emergency Department (HOSPITAL_COMMUNITY)
Admission: EM | Admit: 2010-07-16 | Discharge: 2010-07-17 | Disposition: A | Discharge status: Home / Self Care | Payer: BC Managed Care – PPO | Attending: Emergency Medicine | Admitting: Emergency Medicine

## 2010-07-16 DIAGNOSIS — R1031 Right lower quadrant pain: Secondary | ICD-10-CM | POA: Insufficient documentation

## 2010-07-16 DIAGNOSIS — N940 Mittelschmerz: Secondary | ICD-10-CM | POA: Insufficient documentation

## 2010-07-16 DIAGNOSIS — M329 Systemic lupus erythematosus, unspecified: Secondary | ICD-10-CM | POA: Insufficient documentation

## 2010-07-16 LAB — CBC
HCT: 42.3 % (ref 36.0–46.0)
Hemoglobin: 14.7 g/dL (ref 12.0–15.0)
MCH: 31.1 pg (ref 26.0–34.0)
MCV: 89.4 fL (ref 78.0–100.0)
RBC: 4.73 MIL/uL (ref 3.87–5.11)

## 2010-07-16 LAB — POCT I-STAT, CHEM 8
BUN: 11 mg/dL (ref 6–23)
Calcium, Ion: 1.08 mmol/L — ABNORMAL LOW (ref 1.12–1.32)
Creatinine, Ser: 0.7 mg/dL (ref 0.50–1.10)
Sodium: 139 mEq/L (ref 135–145)
TCO2: 23 mmol/L (ref 0–100)

## 2010-07-16 LAB — DIFFERENTIAL
Lymphocytes Relative: 39 % (ref 12–46)
Lymphs Abs: 4 10*3/uL (ref 0.7–4.0)
Monocytes Relative: 9 % (ref 3–12)
Neutro Abs: 5.3 10*3/uL (ref 1.7–7.7)
Neutrophils Relative %: 51 % (ref 43–77)

## 2010-07-16 LAB — URINALYSIS, ROUTINE W REFLEX MICROSCOPIC
Glucose, UA: NEGATIVE mg/dL
Hgb urine dipstick: NEGATIVE
Protein, ur: NEGATIVE mg/dL

## 2010-07-17 LAB — POCT PREGNANCY, URINE: Preg Test, Ur: NEGATIVE

## 2010-07-17 MED ORDER — IOHEXOL 300 MG/ML  SOLN
125.0000 mL | Freq: Once | INTRAMUSCULAR | Status: AC | PRN
  Administered 2010-07-17: 125 mL via INTRAVENOUS

## 2010-08-04 ENCOUNTER — Emergency Department (HOSPITAL_COMMUNITY)
Admission: EM | Admit: 2010-08-04 | Discharge: 2010-08-04 | Disposition: A | Discharge status: Home / Self Care | Payer: BC Managed Care – PPO | Attending: Emergency Medicine | Admitting: Emergency Medicine

## 2010-08-04 DIAGNOSIS — R109 Unspecified abdominal pain: Secondary | ICD-10-CM | POA: Insufficient documentation

## 2010-08-04 DIAGNOSIS — F329 Major depressive disorder, single episode, unspecified: Secondary | ICD-10-CM | POA: Insufficient documentation

## 2010-08-04 DIAGNOSIS — F3289 Other specified depressive episodes: Secondary | ICD-10-CM | POA: Insufficient documentation

## 2010-08-04 DIAGNOSIS — K625 Hemorrhage of anus and rectum: Secondary | ICD-10-CM | POA: Insufficient documentation

## 2010-08-04 DIAGNOSIS — M329 Systemic lupus erythematosus, unspecified: Secondary | ICD-10-CM | POA: Insufficient documentation

## 2010-08-04 LAB — CBC
HCT: 41.7 % (ref 36.0–46.0)
Hemoglobin: 14.1 g/dL (ref 12.0–15.0)
MCV: 90.3 fL (ref 78.0–100.0)
RBC: 4.62 MIL/uL (ref 3.87–5.11)
WBC: 7.6 10*3/uL (ref 4.0–10.5)

## 2010-08-04 LAB — URINALYSIS, ROUTINE W REFLEX MICROSCOPIC
Bilirubin Urine: NEGATIVE
Glucose, UA: NEGATIVE mg/dL
Hgb urine dipstick: NEGATIVE
Specific Gravity, Urine: 1.024 (ref 1.005–1.030)
pH: 5.5 (ref 5.0–8.0)

## 2010-08-04 LAB — COMPREHENSIVE METABOLIC PANEL
ALT: 66 U/L — ABNORMAL HIGH (ref 0–35)
Alkaline Phosphatase: 67 U/L (ref 39–117)
BUN: 6 mg/dL (ref 6–23)
Chloride: 103 mEq/L (ref 96–112)
GFR calc Af Amer: >60 mL/min (ref >60)
Glucose, Bld: 86 mg/dL (ref 70–99)
Potassium: 4.1 mEq/L (ref 3.5–5.1)
Total Bilirubin: 0.2 mg/dL — ABNORMAL LOW (ref 0.3–1.2)

## 2010-08-04 LAB — DIFFERENTIAL
Eosinophils Relative: 1 % (ref 0–5)
Lymphocytes Relative: 36 % (ref 12–46)
Lymphs Abs: 2.7 10*3/uL (ref 0.7–4.0)
Neutrophils Relative %: 52 % (ref 43–77)

## 2010-08-04 LAB — OCCULT BLOOD, POC DEVICE: Fecal Occult Bld: NEGATIVE

## 2010-08-15 ENCOUNTER — Encounter (HOSPITAL_COMMUNITY): Payer: Self-pay

## 2010-08-15 ENCOUNTER — Other Ambulatory Visit (HOSPITAL_COMMUNITY): Payer: BC Managed Care – PPO

## 2010-08-15 ENCOUNTER — Encounter (HOSPITAL_COMMUNITY)
Admission: RE | Admit: 2010-08-15 | Discharge: 2010-08-15 | Disposition: A | Discharge status: Home / Self Care | Payer: BC Managed Care – PPO | Source: Ambulatory Visit | Attending: Obstetrics and Gynecology | Admitting: Obstetrics and Gynecology

## 2010-08-15 HISTORY — DX: Other specified postprocedural states: R11.2

## 2010-08-15 HISTORY — DX: Syncope and collapse: R55

## 2010-08-15 HISTORY — DX: Unspecified osteoarthritis, unspecified site: M19.90

## 2010-08-15 HISTORY — DX: Other specified postprocedural states: Z98.890

## 2010-08-15 LAB — CBC
MCH: 30.8 pg (ref 26.0–34.0)
MCHC: 33.2 g/dL (ref 30.0–36.0)
MCV: 92.9 fL (ref 78.0–100.0)
Platelets: 205 10*3/uL (ref 150–400)
RDW: 13.4 % (ref 11.5–15.5)

## 2010-08-15 LAB — SURGICAL PCR SCREEN: MRSA, PCR: NEGATIVE

## 2010-08-15 NOTE — Anesthesia Preprocedure Evaluation (Addendum)
Anesthesia Evaluation  Name, MR# and DOB Patient awake  General Assessment Comment  Reviewed: Allergy & Precautions, H&P  and Patient's Chart, lab work & pertinent test results  Airway Mallampati: I TM Distance: >3 FB     Dental  (+) Teeth Intact   Pulmonaryneg pulmonary ROS      pulmonary exam normal   Cardiovascular Regular Normal   Neuro/PsychNegative Neurological ROS   GI/Hepatic/Renal negative GI ROS, negative Liver ROS, and negative Renal ROS (+)       Endo/Other  Negative Endocrine ROS (+)   Abdominal Normal abdominal exam  (+)   Musculoskeletal negative musculoskeletal ROS (+)  Hematology negative hematology ROS (+)   Peds negative pediatric ROS (+)  Reproductive/Obstetrics negative OB ROS   Anesthesia Other Findings             Anesthesia Physical Anesthesia Plan  ASA: II  Anesthesia Plan: General   Post-op Pain Management:    Induction: Intravenous  Airway Management Planned: Oral ETT  Additional Equipment:   Intra-op Plan:   Post-operative Plan: Extubation in OR  Informed Consent: I have reviewed the patients History and Physical, chart, labs and discussed the procedure including the risks, benefits and alternatives for the proposed anesthesia with the patient or authorized representative who has indicated his/her understanding and acceptance.   Dental advisory given  Plan Discussed with:   Anesthesia Plan Comments: (Patient takes Toprol for history of syncope with + tilt test. Also has history of PONV will get triple rx)        Anesthesia Quick Evaluation

## 2010-08-15 NOTE — H&P (Signed)
  DENE LANDSBERG  DICTATION # [id number] CSN# [id number]   Meriel Pica, MD 08/15/2010 2:36 PM

## 2010-08-15 NOTE — Patient Instructions (Addendum)
20 Rebecca Boyle  08/15/2010   Your procedure is scheduled on:  7/18 - Wednesday  Report to [facility] at 0645 AM.  Call this number if you have problems the morning of surgery: [phone]   Remember:   Do not eat food:After Midnight.  Do not drink clear liquids: After Midnight.  Take these medicines the morning of surgery with A SIP OF WATER: toprol   Do not wear jewelry, make-up or nail polish.  Do not bring valuables to the hospital.  Contacts, dentures or bridgework may not be worn into surgery.  Leave suitcase in the car. After surgery it may be brought to your room.  For patients admitted to the hospital, checkout time is 11:00 AM the day of discharge.   Patients discharged the day of surgery will not be allowed to drive home.  Name and phone number of your driver: Cyndi Lennert [phone]  Special Instructions: N/A

## 2010-08-16 NOTE — H&P (Signed)
NAME:  Rebecca Boyle, Rebecca Boyle                ACCOUNT NO.:  MEDICAL RECORD NO.:  [id number]  LOCATION:                                 FACILITY:  PHYSICIAN:  Duke Salvia. Marcelle Overlie, M.D.DATE OF BIRTH:  [birth year]  DATE OF ADMISSION: DATE OF DISCHARGE:                             HISTORY & PHYSICAL   CHIEF COMPLAINT:  Right lower quadrant pain.  HISTORY OF PRESENT ILLNESS:  A 40 year old who had a prior LAVH RSO December 2010, for menorrhagia and a left ovarian cyst.  Findings at that time demonstrated the right tube and ovary were completely normal and that ovary was conserved.  She has done reasonably well until the last 6 months.  She has been experiencing more chronic right lower quadrant pain.  She presented to the ED that lasted long recently and had a CT performed that showed biliary duct dilatation related to prior cholecystectomy.  There were no other abnormalities noted.  It was felt that the appendix was normal. No other abnormalities were noted.  This patient has required narcotics for relief of her pain.  The possibility of adnexal adhesions was discussed with her.  She presents now for definitive RSO.  Procedure including risks related to bleeding, infection, adjacent organ injury, possible need for open additional surgery, possibility of requiring appendectomy at that time if periappendiceal adhesions involving the right tube and ovary all reviewed with her which she understands and accepts.  PAST MEDICAL HISTORY:  Allergies:  MORPHINE.  CURRENT MEDICATIONS:  Toprol, Plaquenil, Lexapro, Nexium, oxycodone.  REVIEW OF SYSTEMS:  Significant for lupus mainly with skin manifestations.  OBSTETRICAL HISTORY:  She is a G2, P2.  PAST SURGICAL HISTORY:  Prior cholecystectomy and LAVH LSO.  SOCIAL HISTORY:  She is separated.  Denies alcohol, tobacco, or drug use.  REVIEW OF SYSTEMS:  Significant for lupus.  PHYSICAL EXAMINATION:  VITAL SIGNS:  Temperature 98.2, blood  pressure 132/84. HEENT:  Unremarkable. NECK:  Supple, without masses. LUNGS:  Clear. CARDIOVASCULAR:  Regular rate and rhythm without murmurs, rubs, or gallops. BREASTS:  Without masses. ABDOMEN:  Soft, flat, nontender. PELVIC:  Normal external genitalia.  Vaginal cuff was clear.  Bimanual revealed slightly full on the right side.  No definite mass. EXTREMITIES AND NEUROLOGIC:  Unremarkable.  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  PLAN:  Laparoscopy with RSO, possible open RSO procedure and risks reviewed as above.     Aryona Sill M. Marcelle Overlie, M.D.     RMH/MEDQ  D:  08/15/2010  T:  08/16/2010  Job:  [id number]

## 2010-08-17 ENCOUNTER — Encounter (HOSPITAL_COMMUNITY): Payer: Self-pay | Admitting: Certified Registered"

## 2010-08-17 ENCOUNTER — Ambulatory Visit (HOSPITAL_COMMUNITY)
Admission: RE | Admit: 2010-08-17 | Discharge: 2010-08-17 | Disposition: A | Discharge status: Home / Self Care | Payer: BC Managed Care – PPO | Source: Ambulatory Visit | Attending: Obstetrics and Gynecology | Admitting: Obstetrics and Gynecology

## 2010-08-17 ENCOUNTER — Encounter (HOSPITAL_COMMUNITY): Payer: Self-pay | Admitting: Anesthesiology

## 2010-08-17 ENCOUNTER — Ambulatory Visit (HOSPITAL_COMMUNITY): Payer: BC Managed Care – PPO | Admitting: Certified Registered"

## 2010-08-17 ENCOUNTER — Encounter (HOSPITAL_COMMUNITY)
Admission: RE | Disposition: A | Discharge status: Home / Self Care | Payer: Self-pay | Source: Ambulatory Visit | Attending: Obstetrics and Gynecology

## 2010-08-17 ENCOUNTER — Other Ambulatory Visit: Payer: Self-pay | Admitting: Obstetrics and Gynecology

## 2010-08-17 DIAGNOSIS — R102 Pelvic and perineal pain: Secondary | ICD-10-CM

## 2010-08-17 DIAGNOSIS — Z01818 Encounter for other preprocedural examination: Secondary | ICD-10-CM | POA: Insufficient documentation

## 2010-08-17 DIAGNOSIS — R1031 Right lower quadrant pain: Secondary | ICD-10-CM | POA: Insufficient documentation

## 2010-08-17 DIAGNOSIS — Z01812 Encounter for preprocedural laboratory examination: Secondary | ICD-10-CM | POA: Insufficient documentation

## 2010-08-17 DIAGNOSIS — Z90710 Acquired absence of both cervix and uterus: Secondary | ICD-10-CM | POA: Insufficient documentation

## 2010-08-17 SURGERY — SALPINGO-OOPHORECTOMY, UNILATERAL, LAPAROSCOPIC
Anesthesia: General | Site: Abdomen | Laterality: Right | Wound class: Clean

## 2010-08-17 MED ORDER — ONDANSETRON HCL 4 MG/2ML IJ SOLN
INTRAMUSCULAR | Status: DC | PRN
  Administered 2010-08-17: 4 mg via INTRAVENOUS

## 2010-08-17 MED ORDER — MEPERIDINE HCL 25 MG/ML IJ SOLN: 6.2500 mg | INTRAMUSCULAR | Status: DC | PRN

## 2010-08-17 MED ORDER — BUPIVACAINE HCL (PF) 0.25 % IJ SOLN
INTRAMUSCULAR | Status: DC | PRN
  Administered 2010-08-17: 8 mL

## 2010-08-17 MED ORDER — HYDROMORPHONE HCL 1 MG/ML IJ SOLN
INTRAMUSCULAR | Status: AC
  Administered 2010-08-17: 0.5 mg via INTRAVENOUS
  Filled 2010-08-17: qty 1

## 2010-08-17 MED ORDER — FENTANYL CITRATE 0.05 MG/ML IJ SOLN: 25.0000 ug | INTRAMUSCULAR | Status: DC | PRN

## 2010-08-17 MED ORDER — FENTANYL CITRATE 0.05 MG/ML IJ SOLN
INTRAMUSCULAR | Status: DC | PRN
  Administered 2010-08-17 (×2): 100 ug via INTRAVENOUS
  Administered 2010-08-17: 50 ug via INTRAVENOUS

## 2010-08-17 MED ORDER — KETOROLAC TROMETHAMINE 30 MG/ML IJ SOLN
INTRAMUSCULAR | Status: DC | PRN
  Administered 2010-08-17: 30 mg via INTRAVENOUS

## 2010-08-17 MED ORDER — NEOSTIGMINE METHYLSULFATE 1 MG/ML IJ SOLN
INTRAMUSCULAR | Status: DC | PRN
  Administered 2010-08-17: 4 mg via INTRAMUSCULAR

## 2010-08-17 MED ORDER — ONDANSETRON HCL 4 MG/2ML IJ SOLN: 4.0000 mg | Freq: Once | INTRAMUSCULAR | Status: DC | PRN

## 2010-08-17 MED ORDER — GLYCOPYRROLATE 0.2 MG/ML IJ SOLN
INTRAMUSCULAR | Status: DC | PRN
  Administered 2010-08-17: .7 mg via INTRAVENOUS
  Administered 2010-08-17: .1 mg via INTRAVENOUS

## 2010-08-17 MED ORDER — MIDAZOLAM HCL 5 MG/5ML IJ SOLN
INTRAMUSCULAR | Status: DC | PRN
  Administered 2010-08-17: 2 mg via INTRAVENOUS

## 2010-08-17 MED ORDER — HYDROMORPHONE HCL 1 MG/ML IJ SOLN
0.5000 mg | INTRAMUSCULAR | Status: DC | PRN
  Administered 2010-08-17 (×3): 0.5 mg via INTRAVENOUS

## 2010-08-17 MED ORDER — LIDOCAINE HCL (PF) 2 % IJ SOLN
INTRAMUSCULAR | Status: DC | PRN
  Administered 2010-08-17: 100 mg

## 2010-08-17 MED ORDER — ACETAMINOPHEN 325 MG PO TABS: 325.0000 mg | ORAL_TABLET | ORAL | Status: DC | PRN

## 2010-08-17 MED ORDER — PROPOFOL 10 MG/ML IV EMUL
INTRAVENOUS | Status: DC | PRN
  Administered 2010-08-17: 200 mg via INTRAVENOUS

## 2010-08-17 MED ORDER — DEXAMETHASONE SODIUM PHOSPHATE 10 MG/ML IJ SOLN
INTRAMUSCULAR | Status: DC | PRN
  Administered 2010-08-17: 10 mg via INTRAVENOUS

## 2010-08-17 MED ORDER — LACTATED RINGERS IV SOLN
INTRAVENOUS | Status: DC
  Administered 2010-08-17 (×2): via INTRAVENOUS

## 2010-08-17 MED ORDER — SUCCINYLCHOLINE CHLORIDE 20 MG/ML IJ SOLN
INTRAMUSCULAR | Status: DC | PRN
  Administered 2010-08-17: 100 mg via INTRAVENOUS

## 2010-08-17 MED ORDER — HYDROMORPHONE HCL 1 MG/ML IJ SOLN: 1.0000 mg | INTRAMUSCULAR | Status: DC | PRN

## 2010-08-17 MED ORDER — DROPERIDOL 2.5 MG/ML IJ SOLN
INTRAMUSCULAR | Status: DC | PRN
  Administered 2010-08-17: 0.625 mg via INTRAVENOUS

## 2010-08-17 MED ORDER — ROCURONIUM BROMIDE 100 MG/10ML IV SOLN
INTRAVENOUS | Status: DC | PRN
  Administered 2010-08-17: 15 mg via INTRAVENOUS

## 2010-08-17 MED ORDER — CEFAZOLIN SODIUM 1-5 GM-% IV SOLN
INTRAVENOUS | Status: AC
  Administered 2010-08-17: 1 g via INTRAVENOUS
  Filled 2010-08-17: qty 50

## 2010-08-17 SURGICAL SUPPLY — 34 items
CANISTER SUCTION 2500CC (MISCELLANEOUS) ×3 IMPLANT
CLOTH BEACON ORANGE TIMEOUT ST (SAFETY) ×3 IMPLANT
CONTAINER PREFILL 10% NBF 60ML (FORM) IMPLANT
DECANTER SPIKE VIAL GLASS SM (MISCELLANEOUS) ×2 IMPLANT
DERMABOND ADVANCED (GAUZE/BANDAGES/DRESSINGS) ×2 IMPLANT
DRESSING TELFA 8X3 (GAUZE/BANDAGES/DRESSINGS) ×3 IMPLANT
GAUZE SPONGE 4X4 12PLY STRL LF (GAUZE/BANDAGES/DRESSINGS) ×6 IMPLANT
GLOVE BIO SURGEON STRL SZ7 (GLOVE) ×6 IMPLANT
GLOVE ECLIPSE 6.0 STRL STRAW (GLOVE) ×2 IMPLANT
GLOVE INDICATOR 6.5 STRL GRN (GLOVE) ×2 IMPLANT
GOWN BRE IMP SLV AUR LG STRL (GOWN DISPOSABLE) ×7 IMPLANT
NDL HYPO 25X1 1.5 SAFETY (NEEDLE) IMPLANT
NDL INSUFFLATION 14GA 120MM (NEEDLE) IMPLANT
NEEDLE HYPO 25X1 1.5 SAFETY (NEEDLE) IMPLANT
NEEDLE INSUFFLATION 14GA 120MM (NEEDLE) ×3 IMPLANT
NS IRRIG 1000ML POUR BTL (IV SOLUTION) ×3 IMPLANT
PACK ABDOMINAL GYN (CUSTOM PROCEDURE TRAY) ×3 IMPLANT
PAD OB MATERNITY 4.3X12.25 (PERSONAL CARE ITEMS) ×3 IMPLANT
SEALER TISSUE G2 CVD JAW 45CM (ENDOMECHANICALS) ×2 IMPLANT
SPONGE LAP 18X18 X RAY DECT (DISPOSABLE) ×3 IMPLANT
STAPLER VISISTAT 35W (STAPLE) ×1 IMPLANT
STRIP CLOSURE SKIN 1/4X4 (GAUZE/BANDAGES/DRESSINGS) ×3 IMPLANT
SUT CHROMIC 0 CT 1 (SUTURE) ×1 IMPLANT
SUT PDS AB 0 CT1 27 (SUTURE) ×2 IMPLANT
SUT VIC AB 0 CT1 18XCR BRD8 (SUTURE) ×1 IMPLANT
SUT VIC AB 0 CT1 8-18 (SUTURE)
SUT VIC AB 3-0 CT1 27 (SUTURE)
SUT VIC AB 3-0 CT1 TAPERPNT 27 (SUTURE) ×1 IMPLANT
SUT VIC AB 4-0 PS2 27 (SUTURE) ×2 IMPLANT
SUT VICRYL 0 TIES 12 18 (SUTURE) ×1 IMPLANT
SYR CONTROL 10ML LL (SYRINGE) IMPLANT
TOWEL OR 17X24 6PK STRL BLUE (TOWEL DISPOSABLE) ×6 IMPLANT
TRAY FOLEY CATH 14FR (SET/KITS/TRAYS/PACK) ×3 IMPLANT
WATER STERILE IRR 1000ML POUR (IV SOLUTION) ×1 IMPLANT

## 2010-08-17 NOTE — Op Note (Signed)
     Preoperative diagnosis: Acute and chronic right lower quadrant pain  Postoperative diagnosis: Same  Procedure: Laparoscopy with laparoscopic RSO  Surgeon: Marcelle Overlie  EBL: Minimal  Specimens removed: Right tube and ovary, specimen to pathology  Complications: None  Procedure and findings:  The patient was taken to the operating room, after an adequate level of general anesthesia was obtained with the legs in stirrups, the abdomen perineum and vagina were prepped and draped in the normal fashion for laparoscopy. The bladder was drained with a Foley catheter. Attention was directed to the abdomen, the area was infiltrated with quarter percent Marcaine plain, a small incision was made in the very seal was introduced without difficulty. Its intra-abdominal position was verified by pressure water testing. After a 3 L pneumoperitoneum syncopated, lap scopic trocar and sleeve were then introduced without difficulty.  Under direct visualization, 3 finger breaths above the symphysis in the midline, a 10 mm bladed trocar was inserted under direct visualization. The patient was then placed in Trendelenburg and the pelvic findings as follows:  The upper abdomen was unremarkable, the cecum and appendix were normal. The uterus left tube and every were surgically absent, there was a small cyst on the right ovary but it was otherwise free and clear. There were no cul-de-sac abnormalities. Using the in seal device the right IP ligament was coagulated and divided freeing up the right tube and every. This was done only after the course of the ureter was noted to be well below. Once this was completed, the right tube and ovary were cut into smaller sections and removed through the lower incision. Inspection of the operative site revealed it to be hemostatic. Prior to closure sponge needle and instrument counts reported as correct x2. Instruments removed, gas allowed to escape defect closed with 4 Dexon  subcuticular sutures and Dermabond. She tolerated this well and recovered in good condition.  Dictated were [facility] [employer], not proofread. Lajuanda Penick M. Milana Obey.D.

## 2010-08-17 NOTE — Transfer of Care (Signed)
Immediate Anesthesia Transfer of Care Note  Patient: Rebecca Boyle  Procedure(s) Performed:  LAPAROSCOPIC SALPINGO OOPHERECTOMY  Patient Location: PACU  Anesthesia Type: General  Level of Consciousness: awake, alert  and oriented  Airway & Oxygen Therapy: Patient Spontanous Breathing and Patient connected to nasal cannula oxygen  Post-op Assessment: Report given to PACU RN and Post -op Vital signs reviewed and stable  Post vital signs: Reviewed and stable  Complications: No apparent anesthesia complications

## 2010-08-17 NOTE — Preoperative (Signed)
Beta Blockers   Reason not to administer Beta Blockers:Not Applicable 

## 2010-08-17 NOTE — Transfer of Care (Signed)
Immediate Anesthesia Transfer of Care Note  Patient: Rebecca Boyle  Procedure(s) Performed:  LAPAROSCOPIC SALPINGO OOPHERECTOMY  Patient Location: PACU  Anesthesia Type: General  Level of Consciousness: awake, alert  and oriented  Airway & Oxygen Therapy: Patient Spontanous Breathing and Patient connected to nasal cannula oxygen  Post-op Assessment: Report given to PACU RN and Post -op Vital signs reviewed and stable  Post vital signs: Reviewed and stable  Complications: No apparent anesthesia complications 

## 2010-08-17 NOTE — H&P (Signed)
Pt status unchanged./

## 2010-08-18 NOTE — Addendum Note (Signed)
Addendum  created 08/18/10 1330 by Randa Spike   Modules edited:Notes Section

## 2010-08-18 NOTE — Anesthesia Postprocedure Evaluation (Signed)
  Anesthesia Post-op Note  Patient: Rebecca Boyle  Procedure(s) Performed:  LAPAROSCOPIC SALPINGO OOPHERECTOMY  Patient Location: PACU  Anesthesia Type: General  Level of Consciousness: patient cooperative  Airway and Oxygen Therapy: Patient Spontanous Breathing  Post-op Pain: mild  Post-op Assessment: Post-op Vital signs reviewed  Post-op Vital Signs: Reviewed  Complications: No apparent anesthesia complications

## 2010-08-18 NOTE — Transfer of Care (Incomplete)
Immediate Anesthesia Transfer of Care Note  Patient: Rebecca Boyle  Procedure(s) Performed:  LAPAROSCOPIC SALPINGO OOPHERECTOMY  Patient Location: {PLACES; ANE POST:19477::"PACU"}  Anesthesia Type: {PROCEDURES; ANE POST ANESTHESIA TYPE:19480}  Level of Consciousness: {FINDINGS; ANE POST LEVEL OF CONSCIOUSNESS:19484}  Airway & Oxygen Therapy: {Exam; oxygen device:30095}  Post-op Assessment: {ASSESSMENT;POST-OP ZOXWRU:04540}  Post vital signs: {DESC; ANE POST JWJXBJ:47829}  Complications: {FINDINGS; ANE POST COMPLICATIONS:19485}

## 2010-08-18 NOTE — Addendum Note (Signed)
Addendum  created 08/18/10 1330 by Kaien Pezzullo D Joselle Deeds   Modules edited:Notes Section    

## 2010-10-28 LAB — URINALYSIS, ROUTINE W REFLEX MICROSCOPIC
Ketones, ur: 15 — AB
Nitrite: NEGATIVE
Nitrite: NEGATIVE
Specific Gravity, Urine: 1.017
Urobilinogen, UA: 0.2
pH: 5.5

## 2010-10-28 LAB — CK
Total CK: 20342 — ABNORMAL HIGH
Total CK: 2944 — ABNORMAL HIGH

## 2010-10-28 LAB — POCT I-STAT, CHEM 8
Calcium, Ion: 1.13
Chloride: 106
Glucose, Bld: 86
HCT: 41
TCO2: 24

## 2010-10-28 LAB — BASIC METABOLIC PANEL
BUN: 7
BUN: 8
BUN: 8
Calcium: 8.7
Calcium: 8.8
Creatinine, Ser: 0.73
GFR calc non Af Amer: >60
GFR calc non Af Amer: >60
GFR calc non Af Amer: >60
Potassium: 3.7
Potassium: 3.7
Sodium: 137

## 2010-10-28 LAB — URINE MICROSCOPIC-ADD ON

## 2010-10-28 LAB — URINE DRUGS OF ABUSE SCREEN W ALC, ROUTINE (REF LAB)
Benzodiazepines.: POSITIVE — AB
Creatinine,U: 93
Ethyl Alcohol: <10
Opiate Screen, Urine: NEGATIVE
Propoxyphene: NEGATIVE

## 2010-10-28 LAB — BENZODIAZEPINE, QUANTITATIVE, URINE: Oxazepam GC/MS Conf: 300 ng/mL

## 2010-10-28 LAB — TSH: TSH: 1.409

## 2010-10-28 LAB — CARDIAC PANEL(CRET KIN+CKTOT+MB+TROPI)
Relative Index: 0
Troponin I: <0.01

## 2010-10-28 LAB — CBC
HCT: 35.7 — ABNORMAL LOW
MCHC: 33.9
MCV: 91.5
Platelets: 170
RDW: 13.2

## 2010-10-28 LAB — HEPATIC FUNCTION PANEL: Total Protein: 6.3

## 2010-10-28 LAB — METHADONE, CONFIRMATION: Methadone GC/MS Conf: 21000 ng/mL

## 2010-11-03 ENCOUNTER — Encounter: Payer: Self-pay | Admitting: Orthopedic Surgery

## 2010-11-03 ENCOUNTER — Ambulatory Visit (INDEPENDENT_AMBULATORY_CARE_PROVIDER_SITE_OTHER): Payer: BC Managed Care – PPO | Admitting: Orthopedic Surgery

## 2010-11-03 VITALS — BP 140/90 | Ht 65.5 in | Wt 162.4 lb

## 2010-11-03 DIAGNOSIS — G5700 Lesion of sciatic nerve, unspecified lower limb: Secondary | ICD-10-CM

## 2010-11-03 DIAGNOSIS — M5430 Sciatica, unspecified side: Secondary | ICD-10-CM

## 2010-11-03 NOTE — Progress Notes (Signed)
New patient  Referred by [facility]  Chief complaint LEFT hip pain with radiation of pain down the LEFT leg  Pain started 6 months ago, came on gradually.  There was no injury.  She denies previous treatment for this problem.  She is on some prednisone.  She is on OxyContin 5 mg for this and she also takes it for lupus.  She complains of throbbing burning 7/10 constant pain down the LEFT leg associated with numbness tingling catching and swelling.  The pain is worse in the morning and with activity.  It is improved with ice pain medication and slight elevation of the LEFT leg.  She denies unexpected weight loss or weight gain fever chills or for T., blurred vision, double vision, shortness of breath frequency easy bleeding or adverse reactions to food  Positive review of systems including murmurs finding, blood in stool, skin rash, anxiety and depression, heat and cold intolerance  She is ALLERGIC to morphine and Tylenol.  She also has anti-inflammatory medication intolerance  She has an autoimmune disease lupus  She's had a hysterectomy a cholecystectomy and a RIGHT wrist nerve repair  Physical Exam(12) GENERAL: normal development   CDV: pulses are normal   Skin: normal  Lymph: nodes were not palpable/normal  Psychiatric: awake, alert and oriented  Neuro: normal sensation  MSK Ambulation analysis is normal   1 LEFT hip exam shows painless passive range of motion, full range of motion, normal strength, no instability. 2 Lasegue's sign and straight leg raises are positive on the LEFT. 3 RIGHT lower extremity exam inspection was normal, range of motion was normal, strength was normal, 4  Hip was stable.  Assessment: Sciatic nerve pain  X-ray of LEFT hip AP and lateral normal  Separate x-ray report AP lateral LEFT hip to evaluate LEFT hip pain  Normal joint space normal femoral head contour normal femoral head offset  Impression normal LEFT hip    Plan:  Referred patient back to Dr. Danielle Dess who she seen before.  She had an MRI of her back in October 2011 it was normal with a slight scoliosis and mild straightening of the lateral view.  Minimal facet hypertrophy was noted inferiorly but no stenosis or disc herniation was noted.

## 2010-11-03 NOTE — Patient Instructions (Addendum)
Your hip is normal   Please call dr Danielle Dess for further follow up

## 2010-12-31 ENCOUNTER — Encounter (HOSPITAL_BASED_OUTPATIENT_CLINIC_OR_DEPARTMENT_OTHER): Payer: Self-pay | Admitting: *Deleted

## 2010-12-31 DIAGNOSIS — F17200 Nicotine dependence, unspecified, uncomplicated: Secondary | ICD-10-CM | POA: Insufficient documentation

## 2010-12-31 DIAGNOSIS — F3289 Other specified depressive episodes: Secondary | ICD-10-CM | POA: Insufficient documentation

## 2010-12-31 DIAGNOSIS — F329 Major depressive disorder, single episode, unspecified: Secondary | ICD-10-CM | POA: Insufficient documentation

## 2010-12-31 DIAGNOSIS — R110 Nausea: Secondary | ICD-10-CM | POA: Insufficient documentation

## 2010-12-31 DIAGNOSIS — Z8739 Personal history of other diseases of the musculoskeletal system and connective tissue: Secondary | ICD-10-CM | POA: Insufficient documentation

## 2010-12-31 DIAGNOSIS — R109 Unspecified abdominal pain: Secondary | ICD-10-CM | POA: Insufficient documentation

## 2010-12-31 NOTE — ED Notes (Signed)
Pt c/o sudden onset RLQ pain about 11;30. Vomited x 2. States she is in a lupus flare-up right now. Was originally bringing daughter to ED to be evaluated.

## 2011-01-01 ENCOUNTER — Encounter (HOSPITAL_BASED_OUTPATIENT_CLINIC_OR_DEPARTMENT_OTHER): Payer: Self-pay

## 2011-01-01 ENCOUNTER — Emergency Department (INDEPENDENT_AMBULATORY_CARE_PROVIDER_SITE_OTHER): Payer: BC Managed Care – PPO

## 2011-01-01 ENCOUNTER — Emergency Department (HOSPITAL_BASED_OUTPATIENT_CLINIC_OR_DEPARTMENT_OTHER)
Admission: EM | Admit: 2011-01-01 | Discharge: 2011-01-01 | Disposition: A | Discharge status: Home / Self Care | Payer: BC Managed Care – PPO | Attending: Emergency Medicine | Admitting: Emergency Medicine

## 2011-01-01 DIAGNOSIS — R6883 Chills (without fever): Secondary | ICD-10-CM

## 2011-01-01 DIAGNOSIS — R112 Nausea with vomiting, unspecified: Secondary | ICD-10-CM

## 2011-01-01 DIAGNOSIS — K921 Melena: Secondary | ICD-10-CM

## 2011-01-01 DIAGNOSIS — R109 Unspecified abdominal pain: Secondary | ICD-10-CM

## 2011-01-01 LAB — BASIC METABOLIC PANEL
BUN: 12 mg/dL (ref 6–23)
Calcium: 9.3 mg/dL (ref 8.4–10.5)
GFR calc Af Amer: >90 mL/min (ref >90)
GFR calc non Af Amer: >90 mL/min (ref >90)
Glucose, Bld: 105 mg/dL — ABNORMAL HIGH (ref 70–99)
Potassium: 3.9 mEq/L (ref 3.5–5.1)
Sodium: 140 mEq/L (ref 135–145)

## 2011-01-01 LAB — CBC
MCH: 31.3 pg (ref 26.0–34.0)
MCHC: 34.2 g/dL (ref 30.0–36.0)
MCV: 91.4 fL (ref 78.0–100.0)
Platelets: 259 10*3/uL (ref 150–400)

## 2011-01-01 LAB — DIFFERENTIAL
Basophils Relative: 1 % (ref 0–1)
Eosinophils Absolute: 0.1 10*3/uL (ref 0.0–0.7)
Eosinophils Relative: 2 % (ref 0–5)
Monocytes Relative: 14 % — ABNORMAL HIGH (ref 3–12)
Neutrophils Relative %: 37 % — ABNORMAL LOW (ref 43–77)

## 2011-01-01 LAB — URINALYSIS, ROUTINE W REFLEX MICROSCOPIC
Bilirubin Urine: NEGATIVE
Hgb urine dipstick: NEGATIVE
Ketones, ur: NEGATIVE mg/dL
Nitrite: NEGATIVE
Urobilinogen, UA: 1 mg/dL (ref 0.0–1.0)

## 2011-01-01 MED ORDER — HYDROMORPHONE HCL PF 1 MG/ML IJ SOLN
1.0000 mg | Freq: Once | INTRAMUSCULAR | Status: AC
  Administered 2011-01-01: 1 mg via INTRAVENOUS
  Filled 2011-01-01: qty 1

## 2011-01-01 MED ORDER — KETOROLAC TROMETHAMINE 30 MG/ML IJ SOLN
30.0000 mg | Freq: Once | INTRAMUSCULAR | Status: AC
  Administered 2011-01-01: 30 mg via INTRAVENOUS
  Filled 2011-01-01: qty 1

## 2011-01-01 MED ORDER — OXYCODONE HCL 5 MG PO TABS: 5.0000 mg | ORAL_TABLET | ORAL | Status: DC | PRN

## 2011-01-01 NOTE — ED Notes (Signed)
Patient transported to CT by this RN and returned with out incident

## 2011-01-01 NOTE — ED Provider Notes (Signed)
History  Scribed for [name], MD, the patient was seen in room MH05/MH05. This chart was scribed by Candelaria Stagers. The patient's care started at 12:38 AM    CSN: [id number] Arrival date & time: 01/01/2011 12:20 AM   First MD Initiated Contact with Patient 01/01/11 0034      Chief Complaint  Patient presents with  . Abdominal Pain     The history is provided by the patient.   Rebecca Boyle is a 40 y.o. female who presents to the Emergency Department complaining of sudden onset of a sharp and shooting abdominal pain on the right side a couple of hours ago.  She describes the pain as a 8/10 at present.  She is also experiencing chills and sweats.  She has had nausea and vomiting over the last couple of hours.  Pain has gotten worse over the last few hours.  Lying still makes the pain feel better while stretching out causes the pain to be worse.  Patient states she has noticed a change in the color of her urine and describes it as a dark orange color.  Patient has a h/o Lups and is currently on a steroid for a flareup.  Symptoms are constant, sharp, worse with stretching and palpation of the right side of the abdomen. No associated diarrhea   Past Medical History  Diagnosis Date  . Arthritis     hands, hips, knees, back feet  . Depression   . Lupus 2004  . PONV (postoperative nausea and vomiting)   . Syncope, cardiogenic 2002    treated with toprol    Past Surgical History  Procedure Date  . Abdominal hysterectomy 2010  . Cholecystectomy 2005  . Breast surgery 1995    lumpectomy of left breast  . Right wrist nerve repair - 2008 2008    fell on nail    Family History  Problem Relation Age of Onset  . Heart disease    . Arthritis    . Cancer      History  Substance Use Topics  . Smoking status: Current Everyday Smoker -- 0.2 packs/day for 10 years    Types: Cigarettes  . Smokeless tobacco: Not on file  . Alcohol Use: No    OB History    Grav Para Term  Preterm Abortions TAB SAB Ect Mult Living                  Review of Systems  Constitutional: Negative for fever.  Gastrointestinal: Positive for nausea and abdominal pain. Negative for diarrhea.  Genitourinary: Negative for dysuria.  Musculoskeletal: Negative for back pain.  Skin: Negative for rash.  Neurological: Negative for headaches.  All other systems reviewed and are negative.    Allergies  Morphine and related; Tylenol; and Codeine  Home Medications   Current Outpatient Rx  Name Route Sig Dispense Refill  . ESTROGEL TD Transdermal Place onto the skin.      Marland Kitchen ESCITALOPRAM OXALATE 20 MG PO TABS Oral Take 20 mg by mouth daily.      Marland Kitchen HYDROXYCHLOROQUINE SULFATE 200 MG PO TABS Oral Take 200 mg by mouth 2 (two) times daily.      Marland Kitchen LORAZEPAM 1 MG PO TABS Oral Take 1 mg by mouth 3 (three) times daily as needed.      Marland Kitchen METOPROLOL SUCCINATE ER 100 MG PO TB24 Oral Take 100 mg by mouth daily.      . MULTIVITAMIN & MINERAL PO Oral Take 1  tablet by mouth daily.      . OXYCODONE HCL 5 MG PO TABS Oral Take 5 mg by mouth every 4 (four) hours as needed. 5-10 mg for pain     . OXYCODONE HCL 5 MG PO TABS Oral Take 1 tablet (5 mg total) by mouth every 4 (four) hours as needed for pain. 10 tablet 0    BP 135/103  Pulse 66  Temp(Src) 98 F (36.7 C) (Oral)  Resp 20  Ht 5' 5.5" (1.664 m)  Wt 158 lb (71.668 kg)  BMI 25.89 kg/m2  SpO2 99%  Physical Exam  Nursing note and vitals reviewed. Constitutional: She is oriented to person, place, and time. She appears well-developed and well-nourished.       Uncomfortable appearing  HENT:  Head: Normocephalic and atraumatic.  Mouth/Throat: Oropharynx is clear and moist. No oropharyngeal exudate.  Eyes: Conjunctivae and EOM are normal. Pupils are equal, round, and reactive to light. Right eye exhibits no discharge. Left eye exhibits no discharge. No scleral icterus.  Neck: Normal range of motion. Neck supple. No JVD present. No thyromegaly  present.  Cardiovascular: Normal rate, regular rhythm, normal heart sounds and intact distal pulses.  Exam reveals no gallop and no friction rub.   No murmur heard. Pulmonary/Chest: Effort normal and breath sounds normal. No respiratory distress. She has no wheezes. She has no rales.  Abdominal: Soft. Bowel sounds are normal. She exhibits no distension and no mass. There is tenderness ( Tender to palpation in the right lower quadrant, no guarding, no other tenderness).       Focal tenderness Right lower quadrant Negative Rovsing's  Negative obturator sign.  Negative psoas sign Positive Carnett sign.  No peritoneal signs  Musculoskeletal: Normal range of motion. She exhibits no edema and no tenderness.  Lymphadenopathy:    She has no cervical adenopathy.  Neurological: She is alert and oriented to person, place, and time. Coordination normal.  Skin: Skin is warm and dry. No rash noted. She is not diaphoretic. No erythema.  Psychiatric: She has a normal mood and affect. Her behavior is normal.    ED Course  Procedures   DIAGNOSTIC STUDIES: Oxygen Saturation is 99% on room air, normal by my interpretation.    COORDINATION OF CARE:     Labs Reviewed  URINALYSIS, ROUTINE W REFLEX MICROSCOPIC - Abnormal; Notable for the following:    APPearance CLOUDY (*)    All other components within normal limits  DIFFERENTIAL - Abnormal; Notable for the following:    Neutrophils Relative 37 (*)    Lymphocytes Relative 47 (*)    Monocytes Relative 14 (*)    All other components within normal limits  BASIC METABOLIC PANEL - Abnormal; Notable for the following:    Glucose, Bld 105 (*)    All other components within normal limits  CBC   Ct Abdomen Pelvis Wo Contrast  01/01/2011  *RADIOLOGY REPORT*  Clinical Data: Sudden onset of sharp right-sided abdominal pain, nausea and vomiting; chills and diaphoresis.  CT ABDOMEN AND PELVIS WITHOUT CONTRAST  Technique:  Multidetector CT imaging of the  abdomen and pelvis was performed following the standard protocol without intravenous contrast.  Comparison: None.  Findings: The visualized lung bases are clear.  There is mildly decreased attenuation within the liver, compatible with fatty infiltration.  The liver is otherwise unremarkable in appearance.  The spleen is within normal limits.  The patient is status post cholecystectomy, with clips noted at the gallbladder fossa.  The pancreas  and adrenal glands are unremarkable in appearance.  The kidneys are unremarkable in appearance.  There is no evidence of hydronephrosis.  No renal or ureteral stones are seen.  No perinephric stranding is appreciated.  No free fluid is identified.  The small bowel is unremarkable in appearance.  The stomach is within normal limits.  No acute vascular abnormalities are seen.  The appendix is normal in caliber, without evidence for appendicitis.  The colon is partially filled with stool and is unremarkable in appearance.  The bladder is largely decompressed and not well assessed.  The patient is status post hysterectomy; no suspicious adnexal masses are seen.  No inguinal lymphadenopathy is seen.  No acute osseous abnormalities are identified.  IMPRESSION:  1.  No acute abnormalities identified within the abdomen or pelvis. 2.  Diffuse fatty infiltration within the liver.  Original Report Authenticated By: Tonia Ghent, M.D.     1. Abdominal pain       MDM   patient improved significantly after pain medications, blood work and urinalysis revealed no significant findings, normal white blood cell count, normal electrolytes, urinalysis without infection. CT scan reviewed by myself and radiologist showing no signs of appendicitis or other acute abnormalities. Patient informed of results and will discharge home  I personally performed the services described in this documentation, which was scribed in my presence. The recorded information has been reviewed and  considered.    [name], MD 01/01/11 [phone]

## 2011-01-03 ENCOUNTER — Ambulatory Visit (INDEPENDENT_AMBULATORY_CARE_PROVIDER_SITE_OTHER): Payer: BC Managed Care – PPO | Admitting: Family

## 2011-01-03 ENCOUNTER — Encounter: Payer: Self-pay | Admitting: Family

## 2011-01-03 DIAGNOSIS — E8940 Asymptomatic postprocedural ovarian failure: Secondary | ICD-10-CM

## 2011-01-03 DIAGNOSIS — M329 Systemic lupus erythematosus, unspecified: Secondary | ICD-10-CM

## 2011-01-03 DIAGNOSIS — F419 Anxiety disorder, unspecified: Secondary | ICD-10-CM

## 2011-01-03 DIAGNOSIS — G8929 Other chronic pain: Secondary | ICD-10-CM

## 2011-01-03 DIAGNOSIS — F411 Generalized anxiety disorder: Secondary | ICD-10-CM

## 2011-01-03 DIAGNOSIS — F329 Major depressive disorder, single episode, unspecified: Secondary | ICD-10-CM

## 2011-01-03 MED ORDER — LORAZEPAM 1 MG PO TABS: 0.5000 mg | ORAL_TABLET | Freq: Three times a day (TID) | ORAL | Status: DC | PRN

## 2011-01-03 MED ORDER — ESCITALOPRAM OXALATE 20 MG PO TABS: 10.0000 mg | ORAL_TABLET | Freq: Every day | ORAL | Status: DC

## 2011-01-03 MED ORDER — ESOMEPRAZOLE MAGNESIUM 40 MG PO CPDR: 40.0000 mg | DELAYED_RELEASE_CAPSULE | Freq: Every day | ORAL | Status: DC

## 2011-01-03 MED ORDER — OXYCODONE HCL 5 MG PO TABS: 5.0000 mg | ORAL_TABLET | ORAL | Status: AC | PRN

## 2011-01-03 NOTE — Patient Instructions (Signed)
Please follow up in 3 months for a complete physical. Welcome to [facility]!

## 2011-01-03 NOTE — Progress Notes (Signed)
Subjective:    Patient ID: Rebecca Boyle, female    DOB: 1970-02-19, 40 y.o.   MRN: [id number]  HPI  Ms.  Boyle is a 40 yr old female who presents today to establish care.    SLE- diagnosed 8 yrs ago.  She reports that she initially presented with discoid lesions.  She is followed by Dr. [name].  She is on plaquenil.  She reports that she has fatigue, and joint pain.  Reports one year ago she had a blood stool. She never had a colonoscopy at that time, denies further blood in the stool.    Depression- reports that she started lexapro 9 mos ago after separation from her husband of 22 yrs.  She reports that she is "stable." She reports that she uses ativan PRN.   Heart Murmur- she reports that she had an echo which showed mild mitral regurg per patient. She does take abx prophylaxis prior to dental procedures.    Hysterectomy- reports left ovary and uterus removed 3 yrs ago due to ovarian cyst.  She then had right sided pain and had cysts on the right. She is using estrogel for HRT since her right ovary was removed.    Hx of cardiogenic syncope 2002- saw Dr. Ladona Ridgel.  She is treated with toprol and has not had any further issues.     Review of Systems  Constitutional: Negative for unexpected weight change.  HENT: Negative for hearing loss.   Eyes: Negative for visual disturbance.  Respiratory: Negative for shortness of breath.   Cardiovascular: Negative for chest pain.  Gastrointestinal: Negative for nausea, vomiting, diarrhea and blood in stool.  Genitourinary: Negative for frequency.  Musculoskeletal: Positive for arthralgias.  Skin: Negative for rash.  Neurological: Negative for headaches.  Hematological: Negative for adenopathy.  Psychiatric/Behavioral:       See HPI   Past Medical History  Diagnosis Date  . Arthritis     hands, hips, knees, back feet  . Depression   . Lupus 2004  . PONV (postoperative nausea and vomiting)   . Syncope, cardiogenic 2002   treated with toprol  . Anxiety   . Heart murmur     History   Social History  . Marital Status: Legally Separated    Spouse Name: N/A    Number of Children: N/A  . Years of Education: college   Occupational History  . Not on file.   Social History Main Topics  . Smoking status: Current Everyday Smoker -- 0.2 packs/day for 10 years    Types: Cigarettes  . Smokeless tobacco: Never Used  . Alcohol Use: No  . Drug Use: No  . Sexually Active: No   Other Topics Concern  . 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 Smokes 2 cigarettes a dayDenies ETOH    Past Surgical History  Procedure Date  . Abdominal hysterectomy 2010    had uterus left ovary removed 2010, right ovary removed 8/12  . Cholecystectomy 2005  . Breast surgery 1995    lumpectomy of left breast- benign  . Right wrist nerve repair - 2008 2008    fell on nail    Family History  Problem Relation Age of Onset  . Hyperlipidemia Mother   . Hypertension Mother   . Stroke Mother     X 5 mini  .  Cancer Mother 82    cervical  . Heart disease Mother   . Hypertension Father   . Heart disease Father   . Stroke Maternal Grandmother   . Lupus Maternal Grandmother   . Heart disease Paternal Grandfather     Allergies  Allergen Reactions  . Morphine And Related Shortness Of Breath  . Tylenol (Acetaminophen) Rash  . Codeine Nausea And Vomiting    Current Outpatient Prescriptions on File Prior to Visit  Medication Sig Dispense Refill  . Estradiol (ESTROGEL TD) Place onto the skin.        . hydroxychloroquine (PLAQUENIL) 200 MG tablet Take 200 mg by mouth 2 (two) times daily.        . metoprolol (TOPROL-XL) 100 MG 24 hr tablet Take 100 mg by mouth daily.        . Multiple Vitamins-Minerals (MULTIVITAMIN & MINERAL PO) Take 1 tablet by mouth daily.          BP 142/100   Pulse 69  Temp(Src) 98.2 F (36.8 C) (Oral)  Ht 5' 5.5" (1.664 m)  Wt 169 lb 1.3 oz (76.694 kg)  BMI 27.71 kg/m2  SpO2 98%       Objective:   Physical Exam  Constitutional: She is oriented to person, place, and time. She appears well-developed and well-nourished. No distress.  HENT:  Head: Normocephalic and atraumatic.  Right Ear: Tympanic membrane and ear canal normal.  Left Ear: Tympanic membrane and ear canal normal.  Eyes: Conjunctivae are normal.  Neck: Neck supple. No thyromegaly present.  Cardiovascular: Normal rate and regular rhythm.   No murmur heard. Pulmonary/Chest: Effort normal and breath sounds normal. No respiratory distress. She has no wheezes. She has no rales. She exhibits no tenderness.  Abdominal: Soft. Bowel sounds are normal. She exhibits no distension and no mass. There is no tenderness.  Musculoskeletal: She exhibits no edema.  Lymphadenopathy:    She has no cervical adenopathy.  Neurological: She is alert and oriented to person, place, and time. She exhibits normal muscle tone. Coordination normal.  Skin: Skin is warm and dry.  Psychiatric: She has a normal mood and affect. Her behavior is normal. Judgment and thought content normal.          Assessment & Plan:

## 2011-01-04 DIAGNOSIS — Z79890 Hormone replacement therapy (postmenopausal): Secondary | ICD-10-CM | POA: Insufficient documentation

## 2011-01-04 DIAGNOSIS — F419 Anxiety disorder, unspecified: Secondary | ICD-10-CM | POA: Insufficient documentation

## 2011-01-04 DIAGNOSIS — F329 Major depressive disorder, single episode, unspecified: Secondary | ICD-10-CM | POA: Insufficient documentation

## 2011-01-04 DIAGNOSIS — F32A Depression, unspecified: Secondary | ICD-10-CM

## 2011-01-04 DIAGNOSIS — G8929 Other chronic pain: Secondary | ICD-10-CM | POA: Insufficient documentation

## 2011-01-04 DIAGNOSIS — M329 Systemic lupus erythematosus, unspecified: Secondary | ICD-10-CM | POA: Insufficient documentation

## 2011-01-04 HISTORY — DX: Depression, unspecified: F32.A

## 2011-01-04 HISTORY — DX: Anxiety disorder, unspecified: F41.9

## 2011-01-04 NOTE — Assessment & Plan Note (Signed)
HRT is prescribed by GYN.

## 2011-01-04 NOTE — Assessment & Plan Note (Signed)
She describes this as joint pain related to her Lupus.  She uses oxycodone PRN.  A controlled substance contract was signed today and a refill was provided.

## 2011-01-04 NOTE — Assessment & Plan Note (Signed)
Pt reports that this is currently well controlled. This is being managed by Dr. [name] (rheumatology).

## 2011-01-04 NOTE — Assessment & Plan Note (Signed)
She reports that this is well controled with the use of PRN lorazepam.

## 2011-01-04 NOTE — Assessment & Plan Note (Signed)
This is currently stable on Lexapro.

## 2011-01-30 ENCOUNTER — Telehealth: Payer: Self-pay | Admitting: Family

## 2011-01-30 NOTE — Telephone Encounter (Signed)
Ok to send oxycodone 180 tabs to pharmacy no refills- this should last her 30 days. She should only take as needed.

## 2011-01-30 NOTE — Telephone Encounter (Signed)
Pt left message requesting refill on her Oxycodone. States we only gave her a 20 day supply. Wants to know if she is supposed to make that amount last 30 days? She is requesting refill. Please advise.

## 2011-01-30 NOTE — Telephone Encounter (Signed)
Pharmacy is unable to take verbal. Will print rx on Wednesday and leave at front for pt to pick up. Left detailed message on pt's cell. Melissa, [facility] is flagging rx due to contraindications with pt's Morphine allergy. Please advise.

## 2011-01-31 DIAGNOSIS — F1120 Opioid dependence, uncomplicated: Secondary | ICD-10-CM

## 2011-01-31 HISTORY — DX: Opioid dependence, uncomplicated: F11.20

## 2011-02-01 MED ORDER — OXYCODONE HCL 5 MG PO TABS: 5.0000 mg | ORAL_TABLET | ORAL | Status: DC | PRN

## 2011-02-01 NOTE — Telephone Encounter (Signed)
Per Efraim Kaufmann, ok to proceed with Rx as pt has been taking Oxycodone without any side effect. Rx printed and forwarded to provider for signature.

## 2011-02-01 NOTE — Telephone Encounter (Signed)
rx signed

## 2011-02-27 ENCOUNTER — Telehealth: Payer: Self-pay | Admitting: *Deleted

## 2011-02-27 MED ORDER — OXYCODONE HCL 5 MG PO TABS: 5.0000 mg | ORAL_TABLET | ORAL | Status: DC | PRN

## 2011-02-27 NOTE — Telephone Encounter (Signed)
Pt returned my call stating she will need written rx of oxycodone 5mg . Written Rx printed and forwarded to Provider for signature.

## 2011-02-27 NOTE — Telephone Encounter (Signed)
rx signed

## 2011-02-27 NOTE — Telephone Encounter (Signed)
Received message from requesting refill of pain meds. States she will be out by Thursday or Friday. Attempted to reach pt and left detailed message on voicemail to call with name of prescriptions that need to be refilled.

## 2011-03-01 ENCOUNTER — Telehealth: Payer: Self-pay | Admitting: *Deleted

## 2011-03-01 NOTE — Telephone Encounter (Signed)
Per Melissa, ok to use aerochamber. Authorization given to [place] at [employer].

## 2011-03-01 NOTE — Telephone Encounter (Signed)
Pt states she has history of exercise induced asthma.  Has an albuterol inhaler that uses as needed. Wants to get an aerochamber for her albuterol.  Albtuerol is 1-2 puffs once or twice a day as needed. Pt states she uses it after activity. Please advise.

## 2011-03-01 NOTE — Telephone Encounter (Signed)
Notified pt that rx is ready for pick up at the front desk.

## 2011-03-20 ENCOUNTER — Other Ambulatory Visit: Payer: Self-pay | Admitting: *Deleted

## 2011-03-20 NOTE — Telephone Encounter (Signed)
Pls call pt and request that she sign medical release form so that I can review her records from her last provider. thanks

## 2011-03-20 NOTE — Telephone Encounter (Signed)
Notified pt, she states this was an old rx that she had filled and her most recent instructions were to take 1mg  four times a day. Advised pt that Lorazepam 1mg  1/2 tablet three times daily were the directions that she confirmed with [place] at her last appt and as prescribed below. She will not be due for a refill before 04/22/11. Pt states she will call back at that time to request refill.

## 2011-03-20 NOTE — Telephone Encounter (Signed)
Received message from pt that she has completed lorazepam from her previous doctor and needs [place] to send a refill to [facility]. Please advise.

## 2011-03-20 NOTE — Telephone Encounter (Signed)
Reviewed Rebecca Boyle controlled substance registry and reviewed with [facility].  She was given a 90 day supply of  Lorazepam 0.5 on 1/23 #270. She is not due for refill prior to 3/23.

## 2011-03-21 NOTE — Telephone Encounter (Signed)
Notified pt and she states she will sign a release tomorrow. Release left at front desk for pt to completed.

## 2011-03-21 NOTE — Telephone Encounter (Signed)
Left message on machine to return my call. 

## 2011-03-28 MED ORDER — OXYCODONE HCL 5 MG PO TABS: 5.0000 mg | ORAL_TABLET | ORAL | Status: DC | PRN

## 2011-03-28 NOTE — Telephone Encounter (Signed)
Pt called stating she needs refill of Oxycodone and will sign release when she picks up written Rx. Last Rx issued on 02/27/11.  Please call pt when Rx is ready.

## 2011-03-28 NOTE — Telephone Encounter (Signed)
Left detailed message on cell voicemail that Rx has been placed at front desk for pick up.

## 2011-04-04 ENCOUNTER — Encounter (HOSPITAL_BASED_OUTPATIENT_CLINIC_OR_DEPARTMENT_OTHER): Payer: Self-pay | Admitting: Emergency Medicine

## 2011-04-04 ENCOUNTER — Emergency Department (INDEPENDENT_AMBULATORY_CARE_PROVIDER_SITE_OTHER): Payer: BC Managed Care – PPO

## 2011-04-04 ENCOUNTER — Emergency Department (HOSPITAL_BASED_OUTPATIENT_CLINIC_OR_DEPARTMENT_OTHER)
Admission: EM | Admit: 2011-04-04 | Discharge: 2011-04-04 | Disposition: A | Discharge status: Home / Self Care | Payer: BC Managed Care – PPO | Attending: Emergency Medicine | Admitting: Emergency Medicine

## 2011-04-04 DIAGNOSIS — R630 Anorexia: Secondary | ICD-10-CM | POA: Insufficient documentation

## 2011-04-04 DIAGNOSIS — M329 Systemic lupus erythematosus, unspecified: Secondary | ICD-10-CM | POA: Insufficient documentation

## 2011-04-04 DIAGNOSIS — Z9889 Other specified postprocedural states: Secondary | ICD-10-CM | POA: Insufficient documentation

## 2011-04-04 DIAGNOSIS — R112 Nausea with vomiting, unspecified: Secondary | ICD-10-CM | POA: Insufficient documentation

## 2011-04-04 DIAGNOSIS — F3289 Other specified depressive episodes: Secondary | ICD-10-CM | POA: Insufficient documentation

## 2011-04-04 DIAGNOSIS — F329 Major depressive disorder, single episode, unspecified: Secondary | ICD-10-CM | POA: Insufficient documentation

## 2011-04-04 DIAGNOSIS — R109 Unspecified abdominal pain: Secondary | ICD-10-CM | POA: Insufficient documentation

## 2011-04-04 DIAGNOSIS — M129 Arthropathy, unspecified: Secondary | ICD-10-CM | POA: Insufficient documentation

## 2011-04-04 DIAGNOSIS — Z9079 Acquired absence of other genital organ(s): Secondary | ICD-10-CM | POA: Insufficient documentation

## 2011-04-04 DIAGNOSIS — R110 Nausea: Secondary | ICD-10-CM

## 2011-04-04 DIAGNOSIS — R10813 Right lower quadrant abdominal tenderness: Secondary | ICD-10-CM | POA: Insufficient documentation

## 2011-04-04 LAB — BASIC METABOLIC PANEL
BUN: 11 mg/dL (ref 6–23)
Chloride: 105 mEq/L (ref 96–112)
Creatinine, Ser: 0.7 mg/dL (ref 0.50–1.10)
Glucose, Bld: 91 mg/dL (ref 70–99)
Potassium: 3.6 mEq/L (ref 3.5–5.1)

## 2011-04-04 LAB — URINALYSIS, ROUTINE W REFLEX MICROSCOPIC
Bilirubin Urine: NEGATIVE
Hgb urine dipstick: NEGATIVE
Ketones, ur: NEGATIVE mg/dL
Nitrite: NEGATIVE

## 2011-04-04 LAB — DIFFERENTIAL
Eosinophils Absolute: 0 10*3/uL (ref 0.0–0.7)
Lymphs Abs: 3.2 10*3/uL (ref 0.7–4.0)
Monocytes Absolute: 1 10*3/uL (ref 0.1–1.0)
Monocytes Relative: 11 % (ref 3–12)
Neutrophils Relative %: 54 % (ref 43–77)

## 2011-04-04 LAB — CBC
HCT: 38.4 % (ref 36.0–46.0)
Hemoglobin: 13.5 g/dL (ref 12.0–15.0)
MCH: 31 pg (ref 26.0–34.0)
RBC: 4.35 MIL/uL (ref 3.87–5.11)

## 2011-04-04 MED ORDER — HYDROMORPHONE HCL PF 1 MG/ML IJ SOLN
1.0000 mg | Freq: Once | INTRAMUSCULAR | Status: AC
  Administered 2011-04-04: 1 mg via INTRAVENOUS
  Filled 2011-04-04: qty 1

## 2011-04-04 MED ORDER — ONDANSETRON 4 MG PO TBDP: 4.0000 mg | ORAL_TABLET | Freq: Three times a day (TID) | ORAL | Status: AC | PRN

## 2011-04-04 MED ORDER — IOHEXOL 300 MG/ML  SOLN
20.0000 mL | Freq: Once | INTRAMUSCULAR | Status: AC | PRN
  Administered 2011-04-04: 20 mL via ORAL

## 2011-04-04 MED ORDER — IOHEXOL 300 MG/ML  SOLN
100.0000 mL | Freq: Once | INTRAMUSCULAR | Status: AC | PRN
  Administered 2011-04-04: 100 mL via INTRAVENOUS

## 2011-04-04 MED ORDER — OXYCODONE-ACETAMINOPHEN 5-325 MG PO TABS: 2.0000 | ORAL_TABLET | ORAL | Status: AC | PRN

## 2011-04-04 MED ORDER — ONDANSETRON HCL 4 MG/2ML IJ SOLN
4.0000 mg | Freq: Once | INTRAMUSCULAR | Status: AC
  Administered 2011-04-04: 4 mg via INTRAVENOUS
  Filled 2011-04-04: qty 2

## 2011-04-04 NOTE — ED Notes (Signed)
Pt transported to CT at this time.

## 2011-04-04 NOTE — ED Provider Notes (Signed)
History     CSN: [id number]  Arrival date & time 04/04/11  6213   First MD Initiated Contact with Patient 04/04/11 1949      Chief Complaint  Patient presents with  . Abdominal Pain  . Nausea  . Emesis    (Consider location/radiation/quality/duration/timing/severity/associated sxs/prior treatment) Patient is a 41 y.o. female presenting with abdominal pain and vomiting. The history is provided by the spouse. No language interpreter was used.  Abdominal Pain The primary symptoms of the illness include abdominal pain, nausea and vomiting. The primary symptoms of the illness do not include dysuria or vaginal discharge. The current episode started 6 to 12 hours ago. The onset of the illness was gradual. The problem has been gradually worsening.  The patient states that she believes she is currently not pregnant. Additional symptoms associated with the illness include anorexia. Symptoms associated with the illness do not include urgency, hematuria, frequency or back pain.  Emesis  Associated symptoms include abdominal pain.    Past Medical History  Diagnosis Date  . Arthritis     hands, hips, knees, back feet  . Depression   . Lupus 2004  . PONV (postoperative nausea and vomiting)   . Syncope, cardiogenic 2002    treated with toprol  . Anxiety   . Heart murmur     Past Surgical History  Procedure Date  . Abdominal hysterectomy 2010    had uterus left ovary removed 2010, right ovary removed 8/12  . Cholecystectomy 2005  . Breast surgery 1995    lumpectomy of left breast- benign  . Right wrist nerve repair - 2008 2008    fell on nail    Family History  Problem Relation Age of Onset  . Hyperlipidemia Mother   . Hypertension Mother   . Stroke Mother     X 5 mini  . Cancer Mother 7    cervical  . Heart disease Mother   . Hypertension Father   . Heart disease Father   . Stroke Maternal Grandmother   . Lupus Maternal Grandmother   . Heart disease Paternal Grandfather      History  Substance Use Topics  . Smoking status: Current Everyday Smoker -- 0.2 packs/day for 10 years    Types: Cigarettes  . Smokeless tobacco: Never Used  . Alcohol Use: No    OB History    Grav Para Term Preterm Abortions TAB SAB Ect Mult Living                  Review of Systems  Gastrointestinal: Positive for nausea, vomiting, abdominal pain and anorexia.  Genitourinary: Negative for dysuria, urgency, frequency, hematuria and vaginal discharge.  Musculoskeletal: Negative for back pain.  All other systems reviewed and are negative.    Allergies  Morphine and related and Codeine  Home Medications   Current Outpatient Rx  Name Route Sig Dispense Refill  . ALBUTEROL SULFATE HFA 108 (90 BASE) MCG/ACT IN AERS Inhalation Inhale 2 puffs into the lungs 4 (four) times daily. For exercise induced asthma    . ESCITALOPRAM OXALATE 20 MG PO TABS Oral Take 0.5 tablets (10 mg total) by mouth daily. 30 tablet 2  . ESOMEPRAZOLE MAGNESIUM 40 MG PO CPDR Oral Take 1 capsule (40 mg total) by mouth daily before breakfast. 30 capsule 2  . ESTROGEL TD Transdermal Place 1 application onto the skin daily.     Marland Kitchen HYDROXYCHLOROQUINE SULFATE 200 MG PO TABS Oral Take 200 mg by mouth 2 (two)  times daily.      Marland Kitchen LORAZEPAM 1 MG PO TABS Oral Take 0.5 mg by mouth 3 (three) times daily as needed. For anxiety    . METOPROLOL SUCCINATE ER 100 MG PO TB24 Oral Take 100 mg by mouth daily.      . ADULT MULTIVITAMIN W/MINERALS CH Oral Take 1 tablet by mouth daily.    . OXYCODONE HCL 5 MG PO TABS Oral Take 1 tablet (5 mg total) by mouth every 4 (four) hours as needed for pain. 180 tablet 0  . VITAMIN C 500 MG PO TABS Oral Take 500 mg by mouth daily.      BP 144/97  Pulse 85  Temp(Src) 98.1 F (36.7 C) (Oral)  Resp 18  Wt 148 lb (67.132 kg)  SpO2 100%  Physical Exam  Nursing note and vitals reviewed. Constitutional: She is oriented to person, place, and time. She appears well-developed and  well-nourished.  HENT:  Head: Normocephalic and atraumatic.  Eyes: Conjunctivae and EOM are normal.  Cardiovascular: Normal rate and regular rhythm.   Pulmonary/Chest: Effort normal and breath sounds normal.  Abdominal: Soft. Bowel sounds are normal. There is tenderness in the right lower quadrant.  Musculoskeletal: Normal range of motion.  Neurological: She is alert and oriented to person, place, and time.  Skin: Skin is warm and dry.  Psychiatric: She has a normal mood and affect.    ED Course  Procedures (including critical care time)  Labs Reviewed  URINALYSIS, ROUTINE W REFLEX MICROSCOPIC - Abnormal; Notable for the following:    APPearance CLOUDY (*)    All other components within normal limits  PREGNANCY, URINE  CBC  DIFFERENTIAL  BASIC METABOLIC PANEL   Ct Abdomen Pelvis W Contrast  04/04/2011  *RADIOLOGY REPORT*  Clinical Data: Abdominal pain with nausea and vomiting.  CT ABDOMEN AND PELVIS WITH CONTRAST  Technique:  Multidetector CT imaging of the abdomen and pelvis was performed following the standard protocol during bolus administration of intravenous contrast.  Contrast: OMNIPAQUE IOHEXOL 300 MG/ML IJ SOLN, 20mL OMNIPAQUE IOHEXOL 300 MG/ML IJ SOLN  Comparison: 01/01/2011  Findings: The liver, spleen, pancreas, adrenal glands, and kidneys are normal.  Gallbladder is been removed.  Chronic dilatation of the common bile duct to a diameter of 11 mm.  No dilated intrahepatic bile ducts.  There is no significant dilatation of bowel loops.  Terminal ileum and appendix are normal.  There is a 14 mm low density nodule in the right side of the pelvis seen on image number 66 of series 2 which was not present on the prior study.  This could represent tiny lymphocele. I doubt this represents a lymph node.  Uterus and ovaries appear to have been removed.  IMPRESSION: No significant abnormality of the abdomen or pelvis.  Original Report Authenticated By: Gwynn Burly, M.D.     1.  Abdominal pain   2. Nausea       MDM  8:02 PM Pt had a total hysterectomy:will evaluate for a possible appendicitis  9:46 PM No acute finding:will send pt home with symptomatic treatment       Teressa Lower, NP 04/04/11 2146

## 2011-04-04 NOTE — Discharge Instructions (Signed)

## 2011-04-04 NOTE — ED Notes (Signed)
Pt c/o nausea all day with RLQ pain starting this afternoon. Pt reports vomiting x 1 episode today.

## 2011-04-04 NOTE — ED Notes (Signed)
NP made aware of continued pain. No new orders received.

## 2011-04-05 NOTE — ED Provider Notes (Signed)
Medical screening examination/treatment/procedure(s) were performed by non-physician practitioner and as supervising physician I was immediately available for consultation/collaboration.   Ellora Varnum, MD 04/05/11 0823 

## 2011-04-18 ENCOUNTER — Telehealth: Payer: Self-pay | Admitting: *Deleted

## 2011-04-18 MED ORDER — METOPROLOL SUCCINATE ER 100 MG PO TB24: 100.0000 mg | ORAL_TABLET | Freq: Every day | ORAL | Status: DC

## 2011-04-18 NOTE — Telephone Encounter (Signed)
Left detailed message informing patient that she does need to make a follow up visit. It is too early for a refill on Ativan and a 30 day supply of toprol has been sent to pharmacy.

## 2011-04-18 NOTE — Telephone Encounter (Signed)
No early refill on ativan.  30 day refill on toprol pls.

## 2011-04-18 NOTE — Telephone Encounter (Signed)
Pt left voice message requesting refills of Toprol and Ativan. Per phone note (refill) of 03/20/11, pt not due for Ativan until 04/22/11. Pt is due for f/u with [place] this month and has no future appts on file. Please advise re: refills.

## 2011-04-18 NOTE — Telephone Encounter (Signed)
Refill sent to pharmacy for Toprol. Please call pt and arrange f/u as she is due for office visit this month.

## 2011-04-21 ENCOUNTER — Other Ambulatory Visit: Payer: Self-pay | Admitting: *Deleted

## 2011-04-21 MED ORDER — LORAZEPAM 1 MG PO TABS: 0.5000 mg | ORAL_TABLET | Freq: Three times a day (TID) | ORAL | Status: DC | PRN

## 2011-04-21 NOTE — Telephone Encounter (Signed)
Pt will be due for refill of Lorazepam on 04/22/11. Current directions are 1/2 tablet three times a day. Please advise re: quantity and refill.

## 2011-04-21 NOTE — Telephone Encounter (Signed)
Refill left on pharmacy voicemail as below. Message left on pt's voicemail re: refill completion.

## 2011-04-21 NOTE — Telephone Encounter (Signed)
Quant #45,no refills.

## 2011-04-25 ENCOUNTER — Other Ambulatory Visit: Payer: Self-pay | Admitting: *Deleted

## 2011-04-25 MED ORDER — OXYCODONE HCL 5 MG PO TABS: 5.0000 mg | ORAL_TABLET | ORAL | Status: DC | PRN

## 2011-04-25 NOTE — Telephone Encounter (Signed)
Signed.

## 2011-04-25 NOTE — Telephone Encounter (Signed)
Pt called requesting written rx of oxycodone IR. States she will have to arrange transportation to pick up Rx and to call when ready. Rx last written 03/28/11. Rx printed and forwarded to Provider. Please advise.

## 2011-05-09 ENCOUNTER — Telehealth: Payer: Self-pay | Admitting: *Deleted

## 2011-05-09 NOTE — Telephone Encounter (Signed)
Records received from Dr Regino Schultze at [facility] and forwarded to provider for review.

## 2011-05-16 ENCOUNTER — Encounter: Payer: Self-pay | Admitting: Family

## 2011-05-16 ENCOUNTER — Ambulatory Visit (INDEPENDENT_AMBULATORY_CARE_PROVIDER_SITE_OTHER): Payer: BC Managed Care – PPO | Admitting: Family

## 2011-05-16 DIAGNOSIS — I10 Essential (primary) hypertension: Secondary | ICD-10-CM

## 2011-05-16 DIAGNOSIS — R519 Headache, unspecified: Secondary | ICD-10-CM

## 2011-05-16 DIAGNOSIS — G43909 Migraine, unspecified, not intractable, without status migrainosus: Secondary | ICD-10-CM

## 2011-05-16 DIAGNOSIS — G8929 Other chronic pain: Secondary | ICD-10-CM

## 2011-05-16 HISTORY — DX: Headache, unspecified: R51.9

## 2011-05-16 MED ORDER — SUMATRIPTAN SUCCINATE 50 MG PO TABS: ORAL_TABLET | ORAL | Status: DC

## 2011-05-16 MED ORDER — AMLODIPINE BESYLATE 5 MG PO TABS: 5.0000 mg | ORAL_TABLET | Freq: Every day | ORAL | Status: DC

## 2011-05-16 NOTE — Assessment & Plan Note (Signed)
Deteriorated.  Recommended to pt that she continue her beta blocker. Will add amlodipine.

## 2011-05-16 NOTE — Patient Instructions (Addendum)
Please follow up in 1 month.   Call if symptoms worsen, or if no improvement.

## 2011-05-16 NOTE — Assessment & Plan Note (Signed)
New.  Symptoms most consistent with migraine.  Will give trial of imitrex. Pt was informed of possible but rare risk of serotonin syndrome.

## 2011-05-16 NOTE — Assessment & Plan Note (Signed)
I declined to provide early refill on her narcotics. Pt verbalizes understanding.

## 2011-05-16 NOTE — Progress Notes (Signed)
Subjective:    Patient ID: Rebecca Boyle, female    DOB: 1970/12/01, 41 y.o.   MRN: [id number]  HPI  Rebecca Boyle is a 41 yr old female who presents today with chief complaint of headache.  Headache started 1 month ago and is constant.  She reports associated nausea, photophobia and phonophobia.  She has taken 2 tabs of oxycodone without relief.  She is requesting early refill on her oxycodone.  She has tried tylenol with some improvement.  She denies known hx of migraine.     Review of Systems See HPI  Past Medical History  Diagnosis Date  . Arthritis     hands, hips, knees, back feet  . Depression   . Lupus 2004  . PONV (postoperative nausea and vomiting)   . Syncope, cardiogenic 2002    treated with toprol  . Anxiety   . Heart murmur     History   Social History  . Marital Status: Legally Separated    Spouse Name: N/A    Number of Children: N/A  . Years of Education: college   Occupational History  . Not on file.   Social History Main Topics  . Smoking status: Current Everyday Smoker -- 0.2 packs/day for 10 years    Types: Cigarettes  . Smokeless tobacco: Never Used  . Alcohol Use: No  . Drug Use: No  . Sexually Active: No   Other Topics Concern  . 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 Smokes 2 cigarettes a dayDenies ETOH    Past Surgical History  Procedure Date  . Abdominal hysterectomy 2010    had uterus left ovary removed 2010, right ovary removed 8/12  . Cholecystectomy 2005  . Breast surgery 1995    lumpectomy of left breast- benign  . Right wrist nerve repair - 2008 2008    fell on nail    Family History  Problem Relation Age of Onset  . Hyperlipidemia Mother   . Hypertension Mother   . Stroke Mother     X 5 mini  . Cancer Mother 50    cervical  . Heart disease Mother   . Hypertension  Father   . Heart disease Father   . Stroke Maternal Grandmother   . Lupus Maternal Grandmother   . Heart disease Paternal Grandfather     Allergies  Allergen Reactions  . Morphine And Related Shortness Of Breath  . Codeine Nausea And Vomiting    Current Outpatient Prescriptions on File Prior to Visit  Medication Sig Dispense Refill  . albuterol (PROVENTIL HFA;VENTOLIN HFA) 108 (90 BASE) MCG/ACT inhaler Inhale 2 puffs into the lungs 4 (four) times daily. For exercise induced asthma      . escitalopram (LEXAPRO) 20 MG tablet Take 0.5 tablets (10 mg total) by mouth daily.  30 tablet  2  . esomeprazole (NEXIUM) 40 MG capsule Take 1 capsule (40 mg total) by mouth daily before breakfast.  30 capsule  2  . Estradiol (ESTROGEL TD) Place 1 application onto the skin daily.       . hydroxychloroquine (PLAQUENIL) 200 MG tablet Take 200 mg by mouth 2 (two) times daily.        Marland Kitchen LORazepam (ATIVAN) 1 MG tablet Take 0.5 tablets (0.5 mg total) by mouth 3 (three) times daily as needed. For anxiety  45 tablet  0  . metoprolol succinate (TOPROL-XL) 100 MG 24 hr tablet Take 1 tablet (100 mg total) by mouth daily.  30 tablet  0  . Multiple Vitamin (MULITIVITAMIN WITH MINERALS) TABS Take 1 tablet by mouth daily.      Marland Kitchen oxyCODONE (OXY IR/ROXICODONE) 5 MG immediate release tablet Take 1 tablet (5 mg total) by mouth every 4 (four) hours as needed for pain.  180 tablet  0  . vitamin C (ASCORBIC ACID) 500 MG tablet Take 500 mg by mouth daily.      Marland Kitchen amLODipine (NORVASC) 5 MG tablet Take 1 tablet (5 mg total) by mouth daily.  30 tablet  1  . SUMAtriptan (IMITREX) 50 MG tablet One tablet at start of migraine.  May repeat in 2 hours as needed.  Max 2 tabs/day  10 tablet  1    BP 132/100  Pulse 71  Temp(Src) 97.8 F (36.6 C) (Oral)  Resp 16  Ht 5' 5.5" (1.664 m)  Wt 158 lb (71.668 kg)  BMI 25.89 kg/m2  SpO2 97%       Objective:   Physical Exam  Constitutional: She appears well-developed and  well-nourished. No distress.  HENT:  Head: Normocephalic and atraumatic.  Eyes: Pupils are equal, round, and reactive to light.  Cardiovascular: Normal rate and regular rhythm.   No murmur heard. Pulmonary/Chest: Effort normal and breath sounds normal. No respiratory distress. She has no wheezes. She has no rales. She exhibits no tenderness.  Neurological: She exhibits normal muscle tone. Coordination normal.  Skin: Skin is warm and dry.  Psychiatric: She has a normal mood and affect. Her behavior is normal. Judgment and thought content normal.          Assessment & Plan:

## 2011-05-18 ENCOUNTER — Telehealth: Payer: Self-pay | Admitting: *Deleted

## 2011-05-18 ENCOUNTER — Telehealth: Payer: Self-pay | Admitting: Family

## 2011-05-18 NOTE — Telephone Encounter (Signed)
Ok to fill tomorrow for #45 with no refills please.

## 2011-05-18 NOTE — Telephone Encounter (Signed)
Refill called to [facility] #45 x no refills to be filled tomorrow or after.

## 2011-05-18 NOTE — Telephone Encounter (Signed)
Received voice message from pt that she started Norvasc 5mg  yesterday. Took dose today. "Head is still pounding" despite imitrex. Reports BP a few minutes ago of 178/118 by a paramedic friend.  Pt requested call back at [phone]. Attempted to reach pt and left detailed message on voicemail per Sandford Craze, NP to take an additional Norvasc 5mg  today and continue 10mg  dose daily after this. Advised pt to call the office an arrange 1 week f/u on BP.

## 2011-05-18 NOTE — Telephone Encounter (Signed)
Lorazepam last filled 04/21/11 #45 x no refills. Please advised re: refill and quantity.

## 2011-05-18 NOTE — Telephone Encounter (Signed)
Refill- lorazepam 1mg  tablet. Take 1/2 a tablet by mouth 3 times a day as needed for anxiety.

## 2011-05-19 NOTE — Telephone Encounter (Signed)
Will address rx request at f/u appt.

## 2011-05-19 NOTE — Telephone Encounter (Signed)
Patient returned phone call and made an appointment for 05/26/11. She also wants to pick up a new prescription for oxycodone at time of her appointment.

## 2011-05-20 ENCOUNTER — Emergency Department (HOSPITAL_COMMUNITY)
Admission: EM | Admit: 2011-05-20 | Discharge: 2011-05-21 | Disposition: A | Discharge status: Home / Self Care | Payer: BC Managed Care – PPO | Attending: Emergency Medicine | Admitting: Emergency Medicine

## 2011-05-20 ENCOUNTER — Encounter (HOSPITAL_COMMUNITY): Payer: Self-pay | Admitting: *Deleted

## 2011-05-20 DIAGNOSIS — Z79899 Other long term (current) drug therapy: Secondary | ICD-10-CM | POA: Insufficient documentation

## 2011-05-20 DIAGNOSIS — Z8739 Personal history of other diseases of the musculoskeletal system and connective tissue: Secondary | ICD-10-CM | POA: Insufficient documentation

## 2011-05-20 DIAGNOSIS — F341 Dysthymic disorder: Secondary | ICD-10-CM | POA: Insufficient documentation

## 2011-05-20 DIAGNOSIS — R51 Headache: Secondary | ICD-10-CM | POA: Insufficient documentation

## 2011-05-20 NOTE — ED Notes (Signed)
Pt states she has been having a HA for 2 weeks. Pt states that the pain is back of head to forehead. Pt has no history of migraines. Pt states that she has tried ibuprofen and tylenol and took an imetrix two days ago with no relief. Pt denies any neurological deficits.

## 2011-05-20 NOTE — ED Notes (Signed)
The pt has had a headache for 13 days. She has had nv she denies any headache history

## 2011-05-21 MED ORDER — METOCLOPRAMIDE HCL 5 MG/ML IJ SOLN
10.0000 mg | Freq: Once | INTRAMUSCULAR | Status: AC
  Administered 2011-05-21: 10 mg via INTRAVENOUS
  Filled 2011-05-21: qty 2

## 2011-05-21 MED ORDER — PROMETHAZINE HCL 25 MG PO TABS: 25.0000 mg | ORAL_TABLET | Freq: Four times a day (QID) | ORAL | Status: DC | PRN

## 2011-05-21 MED ORDER — PROMETHAZINE HCL 25 MG/ML IJ SOLN
25.0000 mg | INTRAMUSCULAR | Status: AC
  Administered 2011-05-21: 25 mg via INTRAVENOUS
  Filled 2011-05-21: qty 1

## 2011-05-21 MED ORDER — KETOROLAC TROMETHAMINE 30 MG/ML IJ SOLN
30.0000 mg | Freq: Once | INTRAMUSCULAR | Status: AC
  Administered 2011-05-21: 30 mg via INTRAVENOUS
  Filled 2011-05-21: qty 1

## 2011-05-21 NOTE — ED Notes (Signed)
EDP at bedside to eval pt.  

## 2011-05-21 NOTE — Discharge Instructions (Signed)
Use promethazine, and ibuprofen 600 mg every 6 hours for recurrent headaches.  Followup with your Dr. for reevaluation.  If her symptoms persist.

## 2011-05-21 NOTE — ED Provider Notes (Signed)
History     CSN: [id number]  Arrival date & time 05/20/11  2241   First MD Initiated Contact with Patient 05/21/11 0051      Chief Complaint  Patient presents with  . Headache    (Consider location/radiation/quality/duration/timing/severity/associated sxs/prior treatment) The history is provided by the patient.   the patient is a 41 year old, female, with a history of hypertension, and lupus, who presents to emergency complaining of a headache for the past 2 weeks.  She has nausea with the headache, but not vomiting.  She has not had fevers, chills, rash, or visual change.  She denies neck pain.  She denies pain anywhere else.  She denies urinary tract symptoms.  She has had a nonproductive cough, only at nighttime.  Past Medical History  Diagnosis Date  . Arthritis     hands, hips, knees, back feet  . Depression   . Lupus 2004  . PONV (postoperative nausea and vomiting)   . Syncope, cardiogenic 2002    treated with toprol  . Anxiety   . Heart murmur     Past Surgical History  Procedure Date  . Abdominal hysterectomy 2010    had uterus left ovary removed 2010, right ovary removed 8/12  . Cholecystectomy 2005  . Breast surgery 1995    lumpectomy of left breast- benign  . Right wrist nerve repair - 2008 2008    fell on nail    Family History  Problem Relation Age of Onset  . Hyperlipidemia Mother   . Hypertension Mother   . Stroke Mother     X 5 mini  . Cancer Mother 6    cervical  . Heart disease Mother   . Hypertension Father   . Heart disease Father   . Stroke Maternal Grandmother   . Lupus Maternal Grandmother   . Heart disease Paternal Grandfather     History  Substance Use Topics  . Smoking status: Current Everyday Smoker -- 0.2 packs/day for 10 years    Types: Cigarettes  . Smokeless tobacco: Never Used  . Alcohol Use: No    OB History    Grav Para Term Preterm Abortions TAB SAB Ect Mult Living                  Review of Systems    Constitutional: Negative for fever and chills.  HENT: Negative for neck pain and neck stiffness.   Eyes: Negative for photophobia and visual disturbance.  Gastrointestinal: Positive for nausea. Negative for vomiting and diarrhea.  Genitourinary: Negative for dysuria.  Neurological: Positive for headaches.  Psychiatric/Behavioral: Negative for confusion.  All other systems reviewed and are negative.    Allergies  Morphine and related and Codeine  Home Medications   Current Outpatient Rx  Name Route Sig Dispense Refill  . ALBUTEROL SULFATE HFA 108 (90 BASE) MCG/ACT IN AERS Inhalation Inhale 2 puffs into the lungs 4 (four) times daily. For exercise induced asthma    . AMLODIPINE BESYLATE 5 MG PO TABS Oral Take 10 mg by mouth daily.    Marland Kitchen ESCITALOPRAM OXALATE 20 MG PO TABS Oral Take 0.5 tablets (10 mg total) by mouth daily. 30 tablet 2  . ESOMEPRAZOLE MAGNESIUM 40 MG PO CPDR Oral Take 1 capsule (40 mg total) by mouth daily before breakfast. 30 capsule 2  . ESTROGEL TD Transdermal Place 1 application onto the skin daily.     Marland Kitchen HYDROXYCHLOROQUINE SULFATE 200 MG PO TABS Oral Take 200 mg by mouth 2 (two) times  daily.      Marland Kitchen LORAZEPAM 1 MG PO TABS Oral Take 0.5 tablets (0.5 mg total) by mouth 3 (three) times daily as needed. For anxiety 45 tablet 0  . METOPROLOL SUCCINATE ER 100 MG PO TB24 Oral Take 1 tablet (100 mg total) by mouth daily. 30 tablet 0  . ADULT MULTIVITAMIN W/MINERALS CH Oral Take 1 tablet by mouth daily.    . OXYCODONE HCL 5 MG PO TABS Oral Take 1 tablet (5 mg total) by mouth every 4 (four) hours as needed for pain. 180 tablet 0  . SUMATRIPTAN SUCCINATE 50 MG PO TABS  One tablet at start of migraine.  May repeat in 2 hours as needed.  Max 2 tabs/day 10 tablet 1  . VITAMIN C 500 MG PO TABS Oral Take 500 mg by mouth daily.      BP 116/78  Pulse 64  Temp(Src) 97.5 F (36.4 C) (Oral)  Resp 16  SpO2 96%  Physical Exam  Vitals reviewed. Constitutional: She is oriented to  person, place, and time. She appears well-developed and well-nourished.  HENT:  Head: Normocephalic and atraumatic.  Eyes: Conjunctivae and EOM are normal.  Neck: Normal range of motion. Neck supple.       No nuchal rigidity  Cardiovascular: Normal rate.   Murmur heard. Pulmonary/Chest: Effort normal. No respiratory distress.  Abdominal: Soft. She exhibits no distension. There is no tenderness.  Musculoskeletal: Normal range of motion.  Neurological: She is alert and oriented to person, place, and time. No cranial nerve deficit.       Strength 5 over 5 in all extremities  Skin: Skin is warm and dry.  Psychiatric: She has a normal mood and affect. Thought content normal.    ED Course  Procedures (including critical care time) Headache for 2 weeks, with no signs of neurological deficits.  No evidence of CNS or systemic infection.  No tests are indicated. Labs Reviewed - No data to display No results found.   No diagnosis found.   2:46 AM Patient states that she still in pain.  However, she is sleeping comfortably.  There is no indication that she truly has pain MDM  Headache No neurological deficits or signs of systemic or CNS infection        Cheri Guppy, MD 05/21/11 [phone]

## 2011-05-26 ENCOUNTER — Telehealth: Payer: Self-pay | Admitting: Family

## 2011-05-26 ENCOUNTER — Ambulatory Visit (INDEPENDENT_AMBULATORY_CARE_PROVIDER_SITE_OTHER): Payer: BC Managed Care – PPO | Admitting: Family

## 2011-05-26 ENCOUNTER — Encounter: Payer: Self-pay | Admitting: Family

## 2011-05-26 ENCOUNTER — Encounter: Payer: Self-pay | Admitting: Neurology

## 2011-05-26 DIAGNOSIS — G43909 Migraine, unspecified, not intractable, without status migrainosus: Secondary | ICD-10-CM

## 2011-05-26 DIAGNOSIS — I10 Essential (primary) hypertension: Secondary | ICD-10-CM

## 2011-05-26 MED ORDER — AMLODIPINE BESYLATE 10 MG PO TABS: 10.0000 mg | ORAL_TABLET | Freq: Every day | ORAL | Status: DC

## 2011-05-26 MED ORDER — OXYCODONE HCL 5 MG PO TABS: 5.0000 mg | ORAL_TABLET | ORAL | Status: DC | PRN

## 2011-05-26 NOTE — Assessment & Plan Note (Signed)
BP Readings from Last 3 Encounters:  05/26/11 142/90  05/21/11 105/67  05/16/11 132/100   BP looks ok to day.  Was low the other day in the ER.  Will continue current meds for now.

## 2011-05-26 NOTE — Patient Instructions (Signed)
You will be contact about your referral to neurology.  Please let us know if you have not heard back within 1 week about your referral. Please schedule a follow up appointment in 3 months.  

## 2011-05-26 NOTE — Assessment & Plan Note (Signed)
Unchanged despite prn imitrex.  Will refer to neurology for further evaluation.

## 2011-05-26 NOTE — Telephone Encounter (Signed)
Spoke with pt.  She will return for nurse visit next week to have her blood pressure checked in both arms.  If significant difference is detected with plan to order CT angiogram to rule out large vessel vasculitis or subclavian stenosis.

## 2011-05-26 NOTE — Progress Notes (Signed)
Subjective:    Patient ID: Rebecca Boyle, female    DOB: [birth year], 41 y.o.   MRN: [id number]  HPI  HTN-  Pt was placed on amlodipine 5 mg and this was increased to 10 mg as her BP remained high.  She reports feeling tired on the amlodipine, but not enough to consider discontinuation of medication.  BP Readings from Last 3 Encounters:  05/26/11 142/90  05/21/11 105/67  05/16/11 132/100   She reports that she has a vast difference between bp readings.  Reports left arm higher.   Headaches are the same- no improvement with imitrex. She notes that she has some improvement with ibuprofen, but nothing "gets rid of the headaches." Review of Systems See HPI  Past Medical History  Diagnosis Date  . Arthritis     hands, hips, knees, back feet  . Depression   . Lupus 2004  . PONV (postoperative nausea and vomiting)   . Syncope, cardiogenic 2002    treated with toprol  . Anxiety   . Heart murmur     History   Social History  . Marital Status: Legally Separated    Spouse Name: N/A    Number of Children: N/A  . Years of Education: college   Occupational History  . Not on file.   Social History Main Topics  . Smoking status: Current Everyday Smoker -- 0.2 packs/day for 10 years    Types: Cigarettes  . Smokeless tobacco: Never Used  . Alcohol Use: No  . Drug Use: No  . Sexually Active: No   Other Topics Concern  . 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 Smokes 2 cigarettes a dayDenies ETOH    Past Surgical History  Procedure Date  . Abdominal hysterectomy 2010    had uterus left ovary removed 2010, right ovary removed 8/12  . Cholecystectomy 2005  . Breast surgery 1995    lumpectomy of left breast- benign  . Right wrist nerve repair - 2008 2008    fell on nail    Family History  Problem Relation Age of Onset  .  Hyperlipidemia Mother   . Hypertension Mother   . Stroke Mother     X 5 mini  . Cancer Mother 51    cervical  . Heart disease Mother   . Hypertension Father   . Heart disease Father   . Stroke Maternal Grandmother   . Lupus Maternal Grandmother   . Heart disease Paternal Grandfather     Allergies  Allergen Reactions  . Morphine And Related Shortness Of Breath  . Codeine Nausea And Vomiting    Current Outpatient Prescriptions on File Prior to Visit  Medication Sig Dispense Refill  . albuterol (PROVENTIL HFA;VENTOLIN HFA) 108 (90 BASE) MCG/ACT inhaler Inhale 2 puffs into the lungs 4 (four) times daily. For exercise induced asthma      . escitalopram (LEXAPRO) 20 MG tablet Take 0.5 tablets (10 mg total) by mouth daily.  30 tablet  2  . esomeprazole (NEXIUM) 40 MG capsule Take 1 capsule (40 mg total) by mouth daily before breakfast.  30 capsule  2  . Estradiol (ESTROGEL TD) Place 1 application onto the skin daily.       . hydroxychloroquine (PLAQUENIL) 200 MG tablet Take 200 mg by mouth 2 (two) times daily.        Marland Kitchen  LORazepam (ATIVAN) 1 MG tablet Take 0.5 tablets (0.5 mg total) by mouth 3 (three) times daily as needed. For anxiety  45 tablet  0  . metoprolol succinate (TOPROL-XL) 100 MG 24 hr tablet Take 1 tablet (100 mg total) by mouth daily.  30 tablet  0  . Multiple Vitamin (MULITIVITAMIN WITH MINERALS) TABS Take 1 tablet by mouth daily.      . SUMAtriptan (IMITREX) 50 MG tablet One tablet at start of migraine.  May repeat in 2 hours as needed.  Max 2 tabs/day  10 tablet  1  . vitamin C (ASCORBIC ACID) 500 MG tablet Take 500 mg by mouth daily.      . promethazine (PHENERGAN) 25 MG tablet Take 1 tablet (25 mg total) by mouth every 6 (six) hours as needed for nausea.  30 tablet  0    BP 142/90  Pulse 84  Temp(Src) 97.9 F (36.6 C) (Oral)  Resp 16  Ht 5' 5.5" (1.664 m)  Wt 159 lb 1.3 oz (72.158 kg)  BMI 26.07 kg/m2  SpO2 97%       Objective:   Physical Exam    Constitutional: She appears well-developed and well-nourished.  Cardiovascular: Normal rate and regular rhythm.   No murmur heard. Pulmonary/Chest: Effort normal and breath sounds normal. No respiratory distress. She has no wheezes. She has no rales. She exhibits no tenderness.  Psychiatric: She has a normal mood and affect. Her behavior is normal. Judgment and thought content normal.          Assessment & Plan:

## 2011-05-31 ENCOUNTER — Ambulatory Visit (INDEPENDENT_AMBULATORY_CARE_PROVIDER_SITE_OTHER): Payer: BC Managed Care – PPO | Admitting: Family

## 2011-05-31 ENCOUNTER — Encounter: Payer: Self-pay | Admitting: Family

## 2011-05-31 VITALS — BP 106/70 | HR 90 | Resp 16

## 2011-05-31 DIAGNOSIS — G43909 Migraine, unspecified, not intractable, without status migrainosus: Secondary | ICD-10-CM

## 2011-05-31 DIAGNOSIS — I10 Essential (primary) hypertension: Secondary | ICD-10-CM

## 2011-05-31 MED ORDER — AMITRIPTYLINE HCL 10 MG PO TABS: 10.0000 mg | ORAL_TABLET | Freq: Every day | ORAL | Status: DC

## 2011-05-31 NOTE — Progress Notes (Signed)
Subjective:    Patient ID: Rebecca Boyle, female    DOB: [birth year], 41 y.o.   MRN: [id number]  HPI  Rebecca Boyle is a 41 yr old female who presents today for follow up of her blood pressure. Specifically, she comes to have her blood pressure checked in both arms as she has had a variance in her readings at home.  Sometimes > 20 mmHg between her right and left arms.    Headaches-  She reports ongoing headache. Wakes up with it- has all day long. No improvement with imitrex.  "can't stand it." Has apt with Dr. Modesto Charon on 7/1 for new patient neurology consult.    Review of Systems    see HPI  Past Medical History  Diagnosis Date  . Arthritis     hands, hips, knees, back feet  . Depression   . Lupus 2004  . PONV (postoperative nausea and vomiting)   . Syncope, cardiogenic 2002    treated with toprol  . Anxiety   . Heart murmur     History   Social History  . Marital Status: Legally Separated    Spouse Name: N/A    Number of Children: N/A  . Years of Education: college   Occupational History  . Not on file.   Social History Main Topics  . Smoking status: Current Everyday Smoker -- 0.2 packs/day for 10 years    Types: Cigarettes  . Smokeless tobacco: Never Used  . Alcohol Use: No  . Drug Use: No  . Sexually Active: No   Other Topics Concern  . 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 Smokes 2 cigarettes a dayDenies ETOH    Past Surgical History  Procedure Date  . Abdominal hysterectomy 2010    had uterus left ovary removed 2010, right ovary removed 8/12  . Cholecystectomy 2005  . Breast surgery 1995    lumpectomy of left breast- benign  . Right wrist nerve repair - 2008 2008    fell on nail    Family History  Problem Relation Age of Onset  . Hyperlipidemia Mother   . Hypertension Mother   . Stroke Mother     X 5 mini  . Cancer Mother 71    cervical  . Heart disease Mother   . Hypertension Father   . Heart disease Father   . Stroke Maternal Grandmother   . Lupus Maternal Grandmother   . Heart disease Paternal Grandfather     Allergies  Allergen Reactions  . Morphine And Related Shortness Of Breath  . Codeine Nausea And Vomiting    Current Outpatient Prescriptions on File Prior to Visit  Medication Sig Dispense Refill  . albuterol (PROVENTIL HFA;VENTOLIN HFA) 108 (90 BASE) MCG/ACT inhaler Inhale 2 puffs into the lungs 4 (four) times daily. For exercise induced asthma      . amLODipine (NORVASC) 10 MG tablet Take 1 tablet (10 mg total) by mouth daily.  30 tablet  5  . escitalopram (LEXAPRO) 20 MG tablet Take 0.5 tablets (10 mg total) by mouth daily.  30 tablet  2  . esomeprazole (NEXIUM) 40 MG capsule Take 1 capsule (40 mg total) by mouth daily before breakfast.  30 capsule  2  . Estradiol (ESTROGEL TD) Place 1 application onto the skin daily.       Marland Kitchen  hydroxychloroquine (PLAQUENIL) 200 MG tablet Take 200 mg by mouth 2 (two) times daily.        Marland Kitchen LORazepam (ATIVAN) 1 MG tablet Take 0.5 tablets (0.5 mg total) by mouth 3 (three) times daily as needed. For anxiety  45 tablet  0  . metoprolol succinate (TOPROL-XL) 100 MG 24 hr tablet Take 1 tablet (100 mg total) by mouth daily.  30 tablet  0  . Multiple Vitamin (MULITIVITAMIN WITH MINERALS) TABS Take 1 tablet by mouth daily.      Marland Kitchen oxyCODONE (OXY IR/ROXICODONE) 5 MG immediate release tablet Take 1 tablet (5 mg total) by mouth every 4 (four) hours as needed for pain.  180 tablet  0  . SUMAtriptan (IMITREX) 50 MG tablet One tablet at start of migraine.  May repeat in 2 hours as needed.  Max 2 tabs/day  10 tablet  1  . vitamin C (ASCORBIC ACID) 500 MG tablet Take 500 mg by mouth daily.      Marland Kitchen amitriptyline (ELAVIL) 10 MG tablet Take 1 tablet (10 mg total) by mouth at bedtime.  30 tablet  2    BP 106/70  Pulse 90  Resp 16  SpO2  99%    Objective:   Physical Exam  Constitutional: She appears well-developed and well-nourished.  Cardiovascular: Normal rate and regular rhythm.   No murmur heard. Pulmonary/Chest: Effort normal and breath sounds normal. No respiratory distress. She has no wheezes. She has no rales. She exhibits no tenderness.  Musculoskeletal: She exhibits no edema.  Psychiatric: She has a normal mood and affect. Her behavior is normal. Judgment and thought content normal.          Assessment & Plan:

## 2011-05-31 NOTE — Assessment & Plan Note (Addendum)
Patient with significant difference in SBP/DBP  between both arms.  She has hx of SLE which raises concern about vasculitis.  Will obtain CT angiogram to rule out large vessel vasculitis or subclavian stenosis.

## 2011-05-31 NOTE — Assessment & Plan Note (Signed)
Unchanged.  Will give trial of HS elavil.  Keep upcoming apt with neuro.  Continue beta blocker, prn imitrex.

## 2011-05-31 NOTE — Patient Instructions (Signed)
You will be contacted about your CT scan. Please keep your appointment with Dr. Modesto Charon. Follow up in 3 months- sooner if problems/concerns.

## 2011-06-02 ENCOUNTER — Ambulatory Visit (HOSPITAL_BASED_OUTPATIENT_CLINIC_OR_DEPARTMENT_OTHER)
Admission: RE | Admit: 2011-06-02 | Discharge: 2011-06-02 | Disposition: A | Discharge status: Home / Self Care | Payer: BC Managed Care – PPO | Source: Ambulatory Visit | Attending: Family | Admitting: Family

## 2011-06-02 DIAGNOSIS — I6529 Occlusion and stenosis of unspecified carotid artery: Secondary | ICD-10-CM | POA: Insufficient documentation

## 2011-06-02 DIAGNOSIS — I10 Essential (primary) hypertension: Secondary | ICD-10-CM | POA: Insufficient documentation

## 2011-06-02 DIAGNOSIS — I771 Stricture of artery: Secondary | ICD-10-CM

## 2011-06-02 DIAGNOSIS — M329 Systemic lupus erythematosus, unspecified: Secondary | ICD-10-CM | POA: Insufficient documentation

## 2011-06-02 MED ORDER — IOHEXOL 350 MG/ML SOLN
80.0000 mL | Freq: Once | INTRAVENOUS | Status: AC | PRN
  Administered 2011-06-02: 80 mL via INTRAVENOUS

## 2011-06-05 ENCOUNTER — Telehealth: Payer: Self-pay | Admitting: *Deleted

## 2011-06-05 NOTE — Telephone Encounter (Signed)
Noted  

## 2011-06-05 NOTE — Telephone Encounter (Signed)
Received message from pt requesting CT result. Also states that headache is no better. Nothing seems to be helping. Pt was advised of CT results. Scheduled appt to discuss cough, fever and headache tomorrow at 9:45am.

## 2011-06-06 ENCOUNTER — Ambulatory Visit (HOSPITAL_BASED_OUTPATIENT_CLINIC_OR_DEPARTMENT_OTHER)
Admission: RE | Admit: 2011-06-06 | Discharge: 2011-06-06 | Disposition: A | Discharge status: Home / Self Care | Payer: BC Managed Care – PPO | Source: Ambulatory Visit | Attending: Family | Admitting: Family

## 2011-06-06 ENCOUNTER — Ambulatory Visit (INDEPENDENT_AMBULATORY_CARE_PROVIDER_SITE_OTHER): Payer: BC Managed Care – PPO | Admitting: Family

## 2011-06-06 ENCOUNTER — Encounter: Payer: Self-pay | Admitting: Family

## 2011-06-06 VITALS — BP 108/82 | HR 83 | Temp 98.8°F | Resp 16 | Wt 159.1 lb

## 2011-06-06 DIAGNOSIS — R51 Headache: Secondary | ICD-10-CM | POA: Insufficient documentation

## 2011-06-06 DIAGNOSIS — J40 Bronchitis, not specified as acute or chronic: Secondary | ICD-10-CM

## 2011-06-06 DIAGNOSIS — H538 Other visual disturbances: Secondary | ICD-10-CM | POA: Insufficient documentation

## 2011-06-06 DIAGNOSIS — G43909 Migraine, unspecified, not intractable, without status migrainosus: Secondary | ICD-10-CM

## 2011-06-06 MED ORDER — AZITHROMYCIN 250 MG PO TABS: ORAL_TABLET | ORAL | Status: DC

## 2011-06-06 MED ORDER — BENZONATATE 100 MG PO CAPS: 100.0000 mg | ORAL_CAPSULE | Freq: Three times a day (TID) | ORAL | Status: AC | PRN

## 2011-06-06 NOTE — Assessment & Plan Note (Signed)
Will plan to rx with zithromax.  She is instructed to continue to use he albuterol PRN.

## 2011-06-06 NOTE — Assessment & Plan Note (Signed)
Ongoing intractable HA. Will plan to obtain MRI at this point as her symptoms are worsening.

## 2011-06-06 NOTE — Patient Instructions (Signed)
You will be contacted about your MRI. Please keep your upcoming appointment with Dr. Modesto Charon. Call if cough worsens, or if not improvement in 2-3 days.

## 2011-06-06 NOTE — Progress Notes (Signed)
Subjective:    Patient ID: Rebecca Boyle, female    DOB: [birth year], 41 y.o.   MRN: [id number]  HPI  Rebecca Boyle is a 41 yr old female who presents today with chief complaint of cough. She reports that cough started 4 days ago and is associated by low grade temp at night- tmax 100.7.  Cough worse at night when she lays flat.  Cough is mostly dry.  Improved by delsym, tussionex some improvement.  +HA- somewhat worse thanit has been.    Review of Systems See HPI  Past Medical History  Diagnosis Date  . Arthritis     hands, hips, knees, back feet  . Depression   . Lupus 2004  . PONV (postoperative nausea and vomiting)   . Syncope, cardiogenic 2002    treated with toprol  . Anxiety   . Heart murmur     History   Social History  . Marital Status: Legally Separated    Spouse Name: N/A    Number of Children: N/A  . Years of Education: college   Occupational History  . Not on file.   Social History Main Topics  . Smoking status: Current Everyday Smoker -- 0.2 packs/day for 10 years    Types: Cigarettes  . Smokeless tobacco: Never Used  . Alcohol Use: No  . Drug Use: No  . Sexually Active: No   Other Topics Concern  . 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 Smokes 2 cigarettes a dayDenies ETOH    Past Surgical History  Procedure Date  . Abdominal hysterectomy 2010    had uterus left ovary removed 2010, right ovary removed 8/12  . Cholecystectomy 2005  . Breast surgery 1995    lumpectomy of left breast- benign  . Right wrist nerve repair - 2008 2008    fell on nail    Family History  Problem Relation Age of Onset  . Hyperlipidemia Mother   . Hypertension Mother   . Stroke Mother     X 5 mini  . Cancer Mother 49    cervical  . Heart disease Mother   . Hypertension Father   . Heart disease Father   . Stroke  Maternal Grandmother   . Lupus Maternal Grandmother   . Heart disease Paternal Grandfather     Allergies  Allergen Reactions  . Morphine And Related Shortness Of Breath  . Codeine Nausea And Vomiting    Current Outpatient Prescriptions on File Prior to Visit  Medication Sig Dispense Refill  . albuterol (PROVENTIL HFA;VENTOLIN HFA) 108 (90 BASE) MCG/ACT inhaler Inhale 2 puffs into the lungs 4 (four) times daily. For exercise induced asthma      . amitriptyline (ELAVIL) 10 MG tablet Take 1 tablet (10 mg total) by mouth at bedtime.  30 tablet  2  . amLODipine (NORVASC) 10 MG tablet Take 1 tablet (10 mg total) by mouth daily.  30 tablet  5  . escitalopram (LEXAPRO) 20 MG tablet Take 0.5 tablets (10 mg total) by mouth daily.  30 tablet  2  . esomeprazole (NEXIUM) 40 MG capsule Take 1 capsule (40 mg total) by mouth daily before breakfast.  30 capsule  2  . Estradiol (ESTROGEL TD) Place 1 application onto the skin daily.       . hydroxychloroquine (PLAQUENIL) 200 MG tablet Take  200 mg by mouth 2 (two) times daily.        Marland Kitchen LORazepam (ATIVAN) 1 MG tablet Take 0.5 tablets (0.5 mg total) by mouth 3 (three) times daily as needed. For anxiety  45 tablet  0  . metoprolol succinate (TOPROL-XL) 100 MG 24 hr tablet Take 1 tablet (100 mg total) by mouth daily.  30 tablet  0  . Multiple Vitamin (MULITIVITAMIN WITH MINERALS) TABS Take 1 tablet by mouth daily.      . ondansetron (ZOFRAN-ODT) 4 MG disintegrating tablet Take 4 mg by mouth as needed.      Marland Kitchen oxyCODONE (OXY IR/ROXICODONE) 5 MG immediate release tablet Take 1 tablet (5 mg total) by mouth every 4 (four) hours as needed for pain.  180 tablet  0  . SUMAtriptan (IMITREX) 50 MG tablet One tablet at start of migraine.  May repeat in 2 hours as needed.  Max 2 tabs/day  10 tablet  1  . vitamin C (ASCORBIC ACID) 500 MG tablet Take 500 mg by mouth daily.        BP 108/82  Pulse 83  Temp(Src) 98.8 F (37.1 C) (Oral)  Resp 16  Wt 159 lb 1.3 oz (72.158  kg)  SpO2 99%       Objective:   Physical Exam  Constitutional: She appears well-developed and well-nourished. No distress.  HENT:  Head: Normocephalic and atraumatic.  Right Ear: Tympanic membrane and ear canal normal.  Left Ear: Tympanic membrane and ear canal normal.  Mouth/Throat: No oropharyngeal exudate, posterior oropharyngeal edema or posterior oropharyngeal erythema.  Cardiovascular: Normal rate and regular rhythm.   No murmur heard. Pulmonary/Chest: Effort normal and breath sounds normal. No respiratory distress. She has no wheezes. She has no rales. She exhibits no tenderness.  Psychiatric: She has a normal mood and affect. Her behavior is normal. Judgment and thought content normal.          Assessment & Plan:

## 2011-06-07 ENCOUNTER — Telehealth: Payer: Self-pay | Admitting: Family

## 2011-06-07 MED ORDER — METOPROLOL SUCCINATE ER 100 MG PO TB24: 100.0000 mg | ORAL_TABLET | Freq: Every day | ORAL | Status: DC

## 2011-06-07 NOTE — Telephone Encounter (Signed)
Refill sent to pharmacy. Pt left message stating her chest congestion is breaking loose but cough seems to be harder today. She is taking the antibiotic. Reports that she has to sleep sitting up, cough is worse at night. Currently taking tessalon perles. Daughter will be having surgery Friday and she will be staying with her in the hospital. Please advise.

## 2011-06-07 NOTE — Telephone Encounter (Signed)
Refill- metoprolol succ er 100mg  tab. Take one tablet by mouth every day. Qty 30

## 2011-06-08 MED ORDER — BECLOMETHASONE DIPROPIONATE 40 MCG/ACT IN AERS: 2.0000 | INHALATION_SPRAY | Freq: Two times a day (BID) | RESPIRATORY_TRACT | Status: DC

## 2011-06-08 NOTE — Telephone Encounter (Signed)
rx sent for qvar. She may also try delsym. If cough worsens, will need to be seen back in the office.

## 2011-06-08 NOTE — Telephone Encounter (Signed)
Left detailed message on cell re: instructions below and to call if any questions. 

## 2011-06-09 ENCOUNTER — Telehealth: Payer: Self-pay | Admitting: *Deleted

## 2011-06-09 ENCOUNTER — Emergency Department (HOSPITAL_COMMUNITY)
Admission: EM | Admit: 2011-06-09 | Discharge: 2011-06-10 | Disposition: A | Discharge status: Home / Self Care | Payer: BC Managed Care – PPO | Attending: Emergency Medicine | Admitting: Emergency Medicine

## 2011-06-09 ENCOUNTER — Encounter (HOSPITAL_COMMUNITY): Payer: Self-pay | Admitting: *Deleted

## 2011-06-09 DIAGNOSIS — R21 Rash and other nonspecific skin eruption: Secondary | ICD-10-CM | POA: Insufficient documentation

## 2011-06-09 DIAGNOSIS — F411 Generalized anxiety disorder: Secondary | ICD-10-CM | POA: Insufficient documentation

## 2011-06-09 DIAGNOSIS — M129 Arthropathy, unspecified: Secondary | ICD-10-CM | POA: Insufficient documentation

## 2011-06-09 DIAGNOSIS — M7989 Other specified soft tissue disorders: Secondary | ICD-10-CM | POA: Insufficient documentation

## 2011-06-09 DIAGNOSIS — M329 Systemic lupus erythematosus, unspecified: Secondary | ICD-10-CM | POA: Insufficient documentation

## 2011-06-09 DIAGNOSIS — F17200 Nicotine dependence, unspecified, uncomplicated: Secondary | ICD-10-CM | POA: Insufficient documentation

## 2011-06-09 DIAGNOSIS — Z79899 Other long term (current) drug therapy: Secondary | ICD-10-CM | POA: Insufficient documentation

## 2011-06-09 LAB — DIFFERENTIAL
Eosinophils Relative: 1 % (ref 0–5)
Lymphocytes Relative: 42 % (ref 12–46)
Lymphs Abs: 3 10*3/uL (ref 0.7–4.0)
Monocytes Absolute: 0.9 10*3/uL (ref 0.1–1.0)
Neutro Abs: 3.2 10*3/uL (ref 1.7–7.7)

## 2011-06-09 LAB — URINALYSIS, ROUTINE W REFLEX MICROSCOPIC
Bilirubin Urine: NEGATIVE
Glucose, UA: NEGATIVE mg/dL
Ketones, ur: NEGATIVE mg/dL
Leukocytes, UA: NEGATIVE
Nitrite: NEGATIVE
Specific Gravity, Urine: >1.03 — ABNORMAL HIGH (ref 1.005–1.030)
pH: 5.5 (ref 5.0–8.0)

## 2011-06-09 LAB — CBC
HCT: 37.3 % (ref 36.0–46.0)
Hemoglobin: 12.5 g/dL (ref 12.0–15.0)
MCV: 91 fL (ref 78.0–100.0)
RBC: 4.1 MIL/uL (ref 3.87–5.11)
WBC: 7.2 10*3/uL (ref 4.0–10.5)

## 2011-06-09 LAB — BASIC METABOLIC PANEL
CO2: 27 mEq/L (ref 19–32)
Calcium: 9.9 mg/dL (ref 8.4–10.5)
Chloride: 102 mEq/L (ref 96–112)
Creatinine, Ser: 1.18 mg/dL — ABNORMAL HIGH (ref 0.50–1.10)
Glucose, Bld: 84 mg/dL (ref 70–99)

## 2011-06-09 LAB — SEDIMENTATION RATE: Sed Rate: 12 mm/hr (ref 0–22)

## 2011-06-09 MED ORDER — ONDANSETRON 8 MG PO TBDP
8.0000 mg | ORAL_TABLET | Freq: Once | ORAL | Status: AC
Start: 2011-06-09 — End: 2011-06-09
  Administered 2011-06-09: 8 mg via ORAL

## 2011-06-09 MED ORDER — OXYCODONE-ACETAMINOPHEN 5-325 MG PO TABS
1.0000 | ORAL_TABLET | Freq: Once | ORAL | Status: AC
  Administered 2011-06-09: 1 via ORAL
  Filled 2011-06-09: qty 1

## 2011-06-09 MED ORDER — ONDANSETRON 8 MG PO TBDP
ORAL_TABLET | ORAL | Status: AC
  Filled 2011-06-09: qty 1

## 2011-06-09 MED ORDER — OXYCODONE-ACETAMINOPHEN 5-325 MG PO TABS
1.0000 | ORAL_TABLET | Freq: Once | ORAL | Status: AC
  Administered 2011-06-09: 1 via ORAL

## 2011-06-09 NOTE — ED Notes (Signed)
Pt returned from bathroom and reports she vomited right after taking pain med, PA notified and zofran ordered, PA advises pain med can be re administered, pt reports she does not want it right now

## 2011-06-09 NOTE — Discharge Instructions (Signed)
Rash A rash is a change in the color or texture of your skin. There are many different types of rashes. You may have other problems that accompany your rash. CAUSES   Infections.   Allergic reactions. This can include allergies to pets or foods.   Certain medicines.   Exposure to certain chemicals, soaps, or cosmetics.   Heat.   Exposure to poisonous plants.   Tumors, both cancerous and noncancerous.  SYMPTOMS   Redness.   Scaly skin.   Itchy skin.   Dry or cracked skin.   Bumps.   Blisters.   Pain.  DIAGNOSIS  Your caregiver may do a physical exam to determine what type of rash you have. A skin sample (biopsy) may be taken and examined under a microscope. TREATMENT  Treatment depends on the type of rash you have. Your caregiver may prescribe certain medicines. For serious conditions, you may need to see a skin doctor (dermatologist). HOME CARE INSTRUCTIONS   Avoid the substance that caused your rash.   Do not scratch your rash. This can cause infection.   You may take cool baths to help stop itching.   Only take over-the-counter or prescription medicines as directed by your caregiver.   Keep all follow-up appointments as directed by your caregiver.  SEEK IMMEDIATE MEDICAL CARE IF:  You have increasing pain, swelling, or redness.   You have a fever.   You have new or severe symptoms.   You have body aches, diarrhea, or vomiting.   Your rash is not better after 3 days.  MAKE SURE YOU:  Understand these instructions.   Will watch your condition.   Will get help right away if you are not doing well or get worse.  Document Released: 01/06/2002 Document Revised: 01/05/2011 Document Reviewed: 10/31/2010 [facility] Patient Information 2012 [place], [place].   Your laboratory tests tonight are completely normal with no indication of any emergent reason for your symptoms today.  Please followup with your doctor for further management if your symptoms persist.   I would suggest resting over the weekend and elevating your legs to help prevent return of peripheral edema.

## 2011-06-09 NOTE — Telephone Encounter (Signed)
Reviewed note, called pt back. Due to discoloration of her feet, I am concerned about possible vasculitis in the setting of SLE.  Advised pt of my concern and directed her to go to the ER.  She verbalizes understanding.

## 2011-06-09 NOTE — ED Notes (Addendum)
Swelling and redness  To bil legs,  Sore areas to arms and legs.

## 2011-06-09 NOTE — Telephone Encounter (Signed)
Pt called stating her cough is much better. Reports that her legs from the knees down and both arms from her elbows down.have a raised itchy rash with blister like appearance. States that her feet have a bluish-purple appearance and she has swelling of both legs. She denies shortness of breath/difficulty breathing. Advised pt to come to the office now for evaluation. Pt states she will have to call for transportation and will call us once she gets someone to bring her. Advised her to be evaluated in the ER if she develops shortness of breath.

## 2011-06-10 MED ORDER — OXYCODONE-ACETAMINOPHEN 5-325 MG PO TABS: 1.0000 | ORAL_TABLET | ORAL | Status: AC | PRN

## 2011-06-10 MED ORDER — OXYCODONE-ACETAMINOPHEN 5-325 MG PO TABS
ORAL_TABLET | ORAL | Status: AC
  Filled 2011-06-10: qty 1

## 2011-06-10 MED ORDER — OXYCODONE-ACETAMINOPHEN 5-325 MG PO TABS: 1.0000 | ORAL_TABLET | ORAL | Status: DC | PRN

## 2011-06-10 NOTE — ED Provider Notes (Signed)
History     CSN: [id number]  Arrival date & time 06/09/11  2014   First MD Initiated Contact with Patient 06/09/11 2126      Chief Complaint  Patient presents with  . Leg Swelling    (Consider location/radiation/quality/duration/timing/severity/associated sxs/prior treatment) HPI Comments: Rebecca Boyle presents for evaluation of bilateral lower extremity swelling,  Redness and rash which developed today,  And has improved since arrival.  She spent the day being fairly active and on her feet,  And this evening note that she had increased bilateral lower extremity edema with redness of both her ankles and feet,  With "blotchy" areas  Of redness with blisters which have since popped and drained a clear fluid,  And now is left with small red spots on her legs where each blister was.  Since elevating her feet the redness has improved.  She called her pcp who informed her to come to the ed to make sure she was not having a lupus flare or vascular problem associated with her lupus.  She denies pain, but reports generalized soreness of her lower extremities.  She denies fever,  Chills,  Cough,  Sob.  She has not had any changes in her current medications.    The history is provided by the spouse and the patient.    Past Medical History  Diagnosis Date  . Arthritis     hands, hips, knees, back feet  . Depression   . Lupus 2004  . PONV (postoperative nausea and vomiting)   . Syncope, cardiogenic 2002    treated with toprol  . Anxiety   . Heart murmur     Past Surgical History  Procedure Date  . Abdominal hysterectomy 2010    had uterus left ovary removed 2010, right ovary removed 8/12  . Cholecystectomy 2005  . Breast surgery 1995    lumpectomy of left breast- benign  . Right wrist nerve repair - 2008 2008    fell on nail    Family History  Problem Relation Age of Onset  . Hyperlipidemia Mother   . Hypertension Mother   . Stroke Mother     X 5 mini  . Cancer Mother 24      cervical  . Heart disease Mother   . Hypertension Father   . Heart disease Father   . Stroke Maternal Grandmother   . Lupus Maternal Grandmother   . Heart disease Paternal Grandfather     History  Substance Use Topics  . Smoking status: Current Everyday Smoker -- 0.2 packs/day for 10 years    Types: Cigarettes  . Smokeless tobacco: Never Used  . Alcohol Use: No    OB History    Grav Para Term Preterm Abortions TAB SAB Ect Mult Living                  Review of Systems  Constitutional: Negative for fever.  HENT: Negative for congestion, sore throat and neck pain.   Eyes: Negative.   Respiratory: Negative for chest tightness and shortness of breath.   Cardiovascular: Positive for leg swelling. Negative for chest pain.  Gastrointestinal: Negative for nausea and abdominal pain.  Genitourinary: Negative.   Musculoskeletal: Positive for arthralgias. Negative for joint swelling.  Skin: Positive for rash. Negative for wound.  Neurological: Negative for dizziness, weakness, light-headedness, numbness and headaches.  Hematological: Negative.   Psychiatric/Behavioral: Negative.     Allergies  Morphine and related and Codeine  Home Medications   Current Outpatient  Rx  Name Route Sig Dispense Refill  . ACETAMINOPHEN 500 MG PO TABS Oral Take 1,000 mg by mouth as needed. FOR PAIN    . ALBUTEROL SULFATE HFA 108 (90 BASE) MCG/ACT IN AERS Inhalation Inhale 2 puffs into the lungs 4 (four) times daily. For exercise induced asthma    . AMITRIPTYLINE HCL 10 MG PO TABS Oral Take 1 tablet (10 mg total) by mouth at bedtime. 30 tablet 2  . AMLODIPINE BESYLATE 10 MG PO TABS Oral Take 1 tablet (10 mg total) by mouth daily. 30 tablet 5  . AZITHROMYCIN 250 MG PO TABS  2 tabs by mouth today, then one tablet daily for 4 more days 6 tablet 0  . BENZONATATE 100 MG PO CAPS Oral Take 1 capsule (100 mg total) by mouth 3 (three) times daily as needed for cough. 20 capsule 0  . ESCITALOPRAM OXALATE  20 MG PO TABS Oral Take 0.5 tablets (10 mg total) by mouth daily. 30 tablet 2  . ESOMEPRAZOLE MAGNESIUM 40 MG PO CPDR Oral Take 1 capsule (40 mg total) by mouth daily before breakfast. 30 capsule 2  . ESTROGEL TD Transdermal Place 1 application onto the skin every evening.     Marland Kitchen HYDROXYCHLOROQUINE SULFATE 200 MG PO TABS Oral Take 200 mg by mouth 2 (two) times daily.      Marland Kitchen LORAZEPAM 1 MG PO TABS Oral Take 0.5 tablets (0.5 mg total) by mouth 3 (three) times daily as needed. For anxiety 45 tablet 0  . METOPROLOL SUCCINATE ER 100 MG PO TB24 Oral Take 1 tablet (100 mg total) by mouth daily. 30 tablet 2  . ADULT MULTIVITAMIN W/MINERALS CH Oral Take 1 tablet by mouth daily.    . OXYCODONE HCL 5 MG PO TABS Oral Take 1 tablet (5 mg total) by mouth every 4 (four) hours as needed for pain. 180 tablet 0  . VITAMIN C 500 MG PO TABS Oral Take 500 mg by mouth daily.    . BECLOMETHASONE DIPROPIONATE 40 MCG/ACT IN AERS Inhalation Inhale 2 puffs into the lungs 2 (two) times daily. 1 Inhaler 3  . OXYCODONE-ACETAMINOPHEN 5-325 MG PO TABS Oral Take 1 tablet by mouth every 4 (four) hours as needed for pain. 6 tablet 0  . OXYCODONE-ACETAMINOPHEN 5-325 MG PO TABS Oral Take 1 tablet by mouth every 4 (four) hours as needed for pain. 20 tablet 0    BP 113/82  Pulse 84  Temp(Src) 97.9 F (36.6 C) (Oral)  Resp 18  Ht 5\' 5"  (1.651 m)  Wt 155 lb (70.308 kg)  BMI 25.79 kg/m2  SpO2 100%  Physical Exam  Nursing note and vitals reviewed. Constitutional: She appears well-developed and well-nourished.  HENT:  Head: Normocephalic and atraumatic.  Eyes: Conjunctivae are normal.  Neck: Normal range of motion.  Cardiovascular: Normal rate, regular rhythm, normal heart sounds and intact distal pulses.   Pulses:      Dorsalis pedis pulses are 2+ on the right side, and 2+ on the left side.  Pulmonary/Chest: Effort normal and breath sounds normal. She has no wheezes.  Abdominal: Soft. Bowel sounds are normal. There is no  tenderness.  Musculoskeletal: Normal range of motion.  Neurological: She is alert.  Skin: Skin is warm and dry. Petechiae and rash noted. Rash is macular.       Few scattered 2-3 mm non blanching macules on bilateral lower extremities.  No peripheral edema appreciated. No cords,  Palpable nodules or ttp.    Psychiatric: She has  a normal mood and affect.    ED Course  Procedures (including critical care time)  Labs Reviewed  DIFFERENTIAL - Abnormal; Notable for the following:    Monocytes Relative 13 (*)    All other components within normal limits  BASIC METABOLIC PANEL - Abnormal; Notable for the following:    Creatinine, Ser 1.18 (*)    GFR calc non Af Amer 56 (*)    GFR calc Af Amer 65 (*)    All other components within normal limits  URINALYSIS, ROUTINE W REFLEX MICROSCOPIC - Abnormal; Notable for the following:    Color, Urine AMBER (*) BIOCHEMICALS MAY BE AFFECTED BY COLOR   APPearance HAZY (*)    Specific Gravity, Urine >1.030 (*)    All other components within normal limits  CBC  SEDIMENTATION RATE  LAB REPORT - SCANNED   No results found.   1. Rash and nonspecific skin eruption       MDM  Labs reviewed prior to dc home.  Also discussed pt with Dr. Adriana Simas who saw the pt prior to dc home.  With stable labs,  Specifically, normal sed rate,  Platelet count,  No evidence of emergent condition at this time.  Pt was prescribed oxycodone prn continued pain.  Encouraged to f/u with pcp or return here (over the weekend) if sx return or worsen.        Burgess Amor, [place] 06/11/11 [phone]

## 2011-06-10 NOTE — ED Notes (Signed)
Pre-pack oxycodone given by PA at d/c

## 2011-06-11 NOTE — ED Provider Notes (Signed)
Medical screening examination/treatment/procedure(s) were conducted as a shared visit with non-physician practitioner(s) and myself.  I personally evaluated the patient during the encounter.  Patient is nontoxic. Rash is nontoxic.  Laboratory evaluation negative  Donnetta Hutching, MD 06/11/11 [phone]

## 2011-06-12 ENCOUNTER — Telehealth: Payer: Self-pay | Admitting: Family

## 2011-06-12 MED ORDER — LORAZEPAM 1 MG PO TABS: 0.5000 mg | ORAL_TABLET | Freq: Three times a day (TID) | ORAL | Status: DC | PRN

## 2011-06-12 NOTE — Telephone Encounter (Signed)
Refill- lorazepam 1mg tablet. Take 1/2 a tablet by mouth 3 times a day as needed for anxiety.  °

## 2011-06-12 NOTE — Telephone Encounter (Signed)
Refill left on pharmacy voicemail for lorazepam 1mg  1/2 tablet by mouth three times a day as needed for anxiety. #45 x no refills.

## 2011-06-13 MED FILL — Oxycodone w/ Acetaminophen Tab 5-325 MG: ORAL | Qty: 6 | Status: AC

## 2011-06-21 ENCOUNTER — Other Ambulatory Visit: Payer: Self-pay | Admitting: *Deleted

## 2011-06-21 NOTE — Telephone Encounter (Signed)
Pt left voice message letting us know that she will be due for her refill of oxycodone on 06/25/11.

## 2011-06-22 NOTE — Telephone Encounter (Signed)
Melissa, I was unable to add Dr Hodgin's info to the rx so I did not print it out yet.

## 2011-06-23 MED ORDER — OXYCODONE HCL 5 MG PO TABS: 5.0000 mg | ORAL_TABLET | ORAL | Status: DC | PRN

## 2011-06-23 NOTE — Telephone Encounter (Signed)
See rx. 

## 2011-06-23 NOTE — Telephone Encounter (Signed)
Left message on cell# that rx is ready for pick up and not to be filled before 06/25/11. Rx placed at front desk.

## 2011-06-23 NOTE — Telephone Encounter (Signed)
Received call from pharmacist that store will be closed on 06/25/11, can they fill rx today. Per verbal from Provider, ok to fill rx today. Notified pharmacist.

## 2011-06-29 ENCOUNTER — Encounter: Payer: Self-pay | Admitting: Family Medicine

## 2011-06-29 ENCOUNTER — Ambulatory Visit (INDEPENDENT_AMBULATORY_CARE_PROVIDER_SITE_OTHER): Payer: BC Managed Care – PPO | Admitting: Family Medicine

## 2011-06-29 VITALS — BP 117/79 | HR 90 | Temp 98.6°F | Ht 65.5 in | Wt 161.8 lb

## 2011-06-29 DIAGNOSIS — M542 Cervicalgia: Secondary | ICD-10-CM

## 2011-06-29 DIAGNOSIS — M329 Systemic lupus erythematosus, unspecified: Secondary | ICD-10-CM

## 2011-06-29 DIAGNOSIS — I10 Essential (primary) hypertension: Secondary | ICD-10-CM

## 2011-06-29 DIAGNOSIS — R000 Tachycardia, unspecified: Secondary | ICD-10-CM | POA: Insufficient documentation

## 2011-06-29 DIAGNOSIS — Z0000 Encounter for general adult medical examination without abnormal findings: Secondary | ICD-10-CM

## 2011-06-29 DIAGNOSIS — F419 Anxiety disorder, unspecified: Secondary | ICD-10-CM

## 2011-06-29 DIAGNOSIS — R51 Headache: Secondary | ICD-10-CM

## 2011-06-29 DIAGNOSIS — F341 Dysthymic disorder: Secondary | ICD-10-CM

## 2011-06-29 DIAGNOSIS — J40 Bronchitis, not specified as acute or chronic: Secondary | ICD-10-CM

## 2011-06-29 HISTORY — DX: Tachycardia, unspecified: R00.0

## 2011-06-29 LAB — RENAL FUNCTION PANEL
Albumin: 4.4 g/dL (ref 3.5–5.2)
BUN: 14 mg/dL (ref 6–23)
CO2: 24 meq/L (ref 19–32)
Calcium: 9.4 mg/dL (ref 8.4–10.5)
Chloride: 108 meq/L (ref 96–112)
Creatinine, Ser: 0.6 mg/dL (ref 0.4–1.2)
GFR: 110.07 mL/min
Glucose, Bld: 88 mg/dL (ref 70–99)
Phosphorus: 2.8 mg/dL (ref 2.3–4.6)
Potassium: 3.7 meq/L (ref 3.5–5.1)
Sodium: 141 meq/L (ref 135–145)

## 2011-06-29 LAB — CBC
HCT: 42.5 % (ref 36.0–46.0)
Hemoglobin: 14 g/dL (ref 12.0–15.0)
MCHC: 33 g/dL (ref 30.0–36.0)
MCV: 94.2 fl (ref 78.0–100.0)
Platelets: 207 10*3/uL (ref 150.0–400.0)
RBC: 4.51 Mil/uL (ref 3.87–5.11)
RDW: 14.2 % (ref 11.5–14.6)
WBC: 7.1 10*3/uL (ref 4.5–10.5)

## 2011-06-29 LAB — LIPID PANEL
Cholesterol: 205 mg/dL — ABNORMAL HIGH (ref 0–200)
HDL: 47.3 mg/dL
Total CHOL/HDL Ratio: 4
Triglycerides: 111 mg/dL (ref 0.0–149.0)
VLDL: 22.2 mg/dL (ref 0.0–40.0)

## 2011-06-29 LAB — HEPATIC FUNCTION PANEL
ALT: 56 U/L — ABNORMAL HIGH (ref 0–35)
AST: 35 U/L (ref 0–37)
Alkaline Phosphatase: 82 U/L (ref 39–117)
Bilirubin, Direct: 0.1 mg/dL (ref 0.0–0.3)
Total Bilirubin: 0.3 mg/dL (ref 0.3–1.2)

## 2011-06-29 LAB — LDL CHOLESTEROL, DIRECT: Direct LDL: 140.9 mg/dL

## 2011-06-29 LAB — TSH: TSH: 0.44 u[IU]/mL (ref 0.35–5.50)

## 2011-06-29 MED ORDER — HYDROCOD POLST-CHLORPHEN POLST 10-8 MG/5ML PO LQCR: 5.0000 mL | Freq: Every evening | ORAL | Status: DC | PRN

## 2011-06-29 MED ORDER — GUAIFENESIN ER 600 MG PO TB12: 600.0000 mg | ORAL_TABLET | Freq: Two times a day (BID) | ORAL | Status: DC

## 2011-06-29 MED ORDER — LORAZEPAM 1 MG PO TABS: 1.0000 mg | ORAL_TABLET | Freq: Three times a day (TID) | ORAL | Status: DC | PRN

## 2011-06-29 MED ORDER — VENLAFAXINE HCL ER 37.5 MG PO CP24: 37.5000 mg | ORAL_CAPSULE | Freq: Every day | ORAL | Status: DC

## 2011-06-29 MED ORDER — BENZONATATE 100 MG PO CAPS: 100.0000 mg | ORAL_CAPSULE | Freq: Two times a day (BID) | ORAL | Status: DC | PRN

## 2011-06-29 MED ORDER — CEFDINIR 300 MG PO CAPS: 300.0000 mg | ORAL_CAPSULE | Freq: Two times a day (BID) | ORAL | Status: DC

## 2011-06-29 MED ORDER — VENLAFAXINE HCL ER 75 MG PO CP24: 75.0000 mg | ORAL_CAPSULE | Freq: Every day | ORAL | Status: DC

## 2011-06-29 MED ORDER — CYCLOBENZAPRINE HCL 10 MG PO TABS: 10.0000 mg | ORAL_TABLET | Freq: Three times a day (TID) | ORAL | Status: AC | PRN

## 2011-06-29 NOTE — Assessment & Plan Note (Signed)
She has been under increased stress recently with a marietal separation, a daughter graduating college and getting marriage. Ailing, aging parents. Will try a change in meds., will titrate down lexapro over next week and titrate up Venlafaxine from 37.5 mg to 75 mg over next week. Increase Lorazepam temporarily to 1 mg bid and a 3 rd dose as needed.

## 2011-06-29 NOTE — Progress Notes (Signed)
Patient ID: Rebecca Boyle, female   DOB: 02/18/1970, 41 y.o.   MRN: [id number] Rebecca Boyle [id number] March 07, 1970 06/29/2011      Progress Note New Patient  Subjective  Chief Complaint  Chief Complaint  Patient presents with  . Establish Care    new patient/ transfer from [facility]  . Cough    still after antibiotics X 1.5 weeks    HPI  Is a 41 year old Caucasian female who is in today for transfer of care. She has multiple concerns. One is that she does struggle with a cough for about 3 weeks now. Had a recent course of antibiotics and her cough is persistent. It is generally nonproductive but sometimes is mildly productive. She does note some postnasal drip. The cough is worse at night when she lies down. Only minimal nasal congestion and no fevers chills or noted. She is struggling with myalgias and fatigue. She has a long history of lupus which tends to worsen a possible skin lesions and more symptoms in the summer months. As long history of tachycardia palpitations. Cannot feel her heart fluttering rapidly just for a few seconds have a slight sensation of shortness of breath resolved. She's also electrophysiology and this has not worsened recently. He is complaining of nearly daily headache for the last 3-4 months. Denies any trauma change in lifestyle other than her stressors. Describes the pain is at occiput and constant. She is up-to-date with them. She has appointment soon with neurology. Anxiety and depression are worsening. Her husband has left her oldest daughters graduated college and is trying to get married. She is helping to care for her aging inhaling parents. She was previously used multiple SSRIs and they were for a short time before resolving. She is cardiomediated syncope but has not had a recent history  Past Medical History  Diagnosis Date  . Arthritis     hands, hips, knees, back feet  . Depression   . Lupus 2004  . PONV (postoperative nausea and vomiting)   .  Syncope, cardiogenic 2002    treated with toprol  . Anxiety   . Heart murmur   . Chicken pox as a child  . Hypertension   . Tachycardia 06/29/2011  . Anxiety and depression 01/04/2011  . Headache disorder 05/16/2011    Past Surgical History  Procedure Date  . Abdominal hysterectomy 2010    had uterus left ovary removed 2010, right ovary removed 8/12  . Cholecystectomy 2005  . Breast surgery 1995    lumpectomy of left breast- benign  . Right wrist nerve repair - 2008 2008    fell on nail, repair    Family History  Problem Relation Age of Onset  . Hyperlipidemia Mother   . Hypertension Mother   . Stroke Mother     X 5 mini  . Cancer Mother 18    cervical  . Heart disease Mother   . Hypertension Father   . Heart disease Father     cad with stent  . Arthritis Father     2 blown discs  . Stroke Maternal Grandmother   . Lupus Maternal Grandmother   . Heart disease Paternal Grandfather     History   Social History  . Marital Status: Legally Separated    Spouse Name: N/A    Number of Children: N/A  . Years of Education: college   Occupational History  . Not on file.   Social History Main Topics  . Smoking status: Current Everyday  Smoker -- 0.2 packs/day for 10 years    Types: Cigarettes  . Smokeless tobacco: Never Used  . Alcohol Use: No  . Drug Use: No  . Sexually Active: No   Other Topics Concern  . 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 Smokes 2 cigarettes a dayDenies ETOH    Current Outpatient Prescriptions on File Prior to Visit  Medication Sig Dispense Refill  . albuterol (PROVENTIL HFA;VENTOLIN HFA) 108 (90 BASE) MCG/ACT inhaler Inhale 2 puffs into the lungs 4 (four) times daily. For exercise induced asthma      . amLODipine (NORVASC) 10 MG tablet Take 1 tablet (10 mg total) by mouth daily.  30 tablet  5    . beclomethasone (QVAR) 40 MCG/ACT inhaler Inhale 2 puffs into the lungs 2 (two) times daily.  1 Inhaler  3  . escitalopram (LEXAPRO) 20 MG tablet Take 0.5 tablets (10 mg total) by mouth daily.  30 tablet  2  . esomeprazole (NEXIUM) 40 MG capsule Take 1 capsule (40 mg total) by mouth daily before breakfast.  30 capsule  2  . Estradiol (ESTROGEL TD) Place 1 application onto the skin every evening.       . hydroxychloroquine (PLAQUENIL) 200 MG tablet Take 200 mg by mouth 2 (two) times daily.        . metoprolol succinate (TOPROL-XL) 100 MG 24 hr tablet Take 1 tablet (100 mg total) by mouth daily.  30 tablet  2  . Multiple Vitamin (MULITIVITAMIN WITH MINERALS) TABS Take 1 tablet by mouth daily.      Marland Kitchen oxyCODONE (OXY IR/ROXICODONE) 5 MG immediate release tablet Take 1 tablet (5 mg total) by mouth every 4 (four) hours as needed for pain.  180 tablet  0  . vitamin C (ASCORBIC ACID) 500 MG tablet Take 500 mg by mouth daily.      Marland Kitchen venlafaxine XR (EFFEXOR XR) 75 MG 24 hr capsule Take 1 capsule (75 mg total) by mouth daily. Start after the 37.5 mg is completed  30 capsule  1    Allergies  Allergen Reactions  . Morphine And Related Shortness Of Breath  . Codeine Nausea And Vomiting    Review of Systems  Review of Systems  Constitutional: Positive for malaise/fatigue. Negative for fever and chills.  HENT: Positive for congestion. Negative for hearing loss and nosebleeds.   Eyes: Negative for discharge.  Respiratory: Positive for cough and wheezing. Negative for sputum production and shortness of breath.   Cardiovascular: Negative for chest pain, palpitations and leg swelling.  Gastrointestinal: Negative for heartburn, nausea, vomiting, abdominal pain, diarrhea, constipation and blood in stool.  Genitourinary: Negative for dysuria, urgency, frequency and hematuria.  Musculoskeletal: Positive for myalgias. Negative for back pain and falls.  Skin: Negative for rash.  Neurological: Positive for  headaches. Negative for dizziness, tremors, sensory change, focal weakness, loss of consciousness and weakness.  Endo/Heme/Allergies: Negative for polydipsia. Does not bruise/bleed easily.  Psychiatric/Behavioral: Positive for depression. Negative for suicidal ideas. The patient is nervous/anxious. The patient does not have insomnia.     Objective  BP 117/79  Pulse 90  Temp(Src) 98.6 F (37 C) (Temporal)  Ht 5' 5.5" (1.664 m)  Wt 161 lb 12.8 oz (73.392 kg)  BMI 26.52 kg/m2  SpO2 100%  Physical Exam  Physical Exam  Constitutional: She is oriented  to person, place, and time and well-developed, well-nourished, and in no distress. No distress.  HENT:  Head: Normocephalic and atraumatic.  Right Ear: External ear normal.  Left Ear: External ear normal.  Nose: Nose normal.  Mouth/Throat: Oropharynx is clear and moist. No oropharyngeal exudate.  Eyes: Conjunctivae are normal. Pupils are equal, round, and reactive to light. Right eye exhibits no discharge. Left eye exhibits no discharge. No scleral icterus.  Neck: Normal range of motion. Neck supple. No thyromegaly present.  Cardiovascular: Normal rate, regular rhythm, normal heart sounds and intact distal pulses.   No murmur heard. Pulmonary/Chest: Effort normal and breath sounds normal. No respiratory distress. She has no wheezes. She has no rales.  Abdominal: Soft. Bowel sounds are normal. She exhibits no distension and no mass. There is no tenderness.  Musculoskeletal: Normal range of motion. She exhibits no edema and no tenderness.  Lymphadenopathy:    She has no cervical adenopathy.  Neurological: She is alert and oriented to person, place, and time. She has normal reflexes. No cranial nerve deficit. Coordination normal.  Skin: Skin is warm and dry. No rash noted. She is not diaphoretic.  Psychiatric: Mood, memory and affect normal.       Assessment & Plan  HTN (hypertension) Well controlled today no change in  meds  Tachycardia Follows with electrophysiology and is on Toprol and Norvasc both  Bronchitis Cefdinir bid, stop Qvar. Use Albuterol prn. Increase clear fluids and rest. Add Mucinex bid. Tussionex qhs r  SLE (systemic lupus erythematosus) Tolerating Hydroxychloroquine. Follows with rheumatology  Headache disorder Has been struggling with daily HA along her occiput for 3-4 months. Pain is essentially constant, no associated symptoms, has an appt with neurology soon. Consider occipital neurolgia. Instructed to try moist heat and gentle stretching, given Cyclobenzaprine to try qhs  Anxiety and depression She has been under increased stress recently with a marietal separation, a daughter graduating college and getting marriage. Ailing, aging parents. Will try a change in meds., will titrate down lexapro over next week and titrate up Venlafaxine from 37.5 mg to 75 mg over next week. Increase Lorazepam temporarily to 1 mg bid and a 3 rd dose as needed.

## 2011-06-29 NOTE — Assessment & Plan Note (Signed)
Well controlled today no change in meds 

## 2011-06-29 NOTE — Patient Instructions (Signed)
Preventive Care for Adults, Female A healthy lifestyle and preventive care can promote health and wellness. Preventive health guidelines for women include the following key practices.  A routine yearly physical is a good way to check with your caregiver about your health and preventive screening. It is a chance to share any concerns and updates on your health, and to receive a thorough exam.   Visit your dentist for a routine exam and preventive care every 6 months. Brush your teeth twice a day and floss once a day. Good oral hygiene prevents tooth decay and gum disease.   The frequency of eye exams is based on your age, health, family medical history, use of contact lenses, and other factors. Follow your caregiver's recommendations for frequency of eye exams.   Eat a healthy diet. Foods like vegetables, fruits, whole grains, low-fat dairy products, and lean protein foods contain the nutrients you need without too many calories. Decrease your intake of foods high in solid fats, added sugars, and salt. Eat the right amount of calories for you.Get information about a proper diet from your caregiver, if necessary.   Regular physical exercise is one of the most important things you can do for your health. Most adults should get at least 150 minutes of moderate-intensity exercise (any activity that increases your heart rate and causes you to sweat) each week. In addition, most adults need muscle-strengthening exercises on 2 or more days a week.   Maintain a healthy weight. The body mass index (BMI) is a screening tool to identify possible weight problems. It provides an estimate of body fat based on height and weight. Your caregiver can help determine your BMI, and can help you achieve or maintain a healthy weight.For adults 20 years and older:   A BMI below 18.5 is considered underweight.   A BMI of 18.5 to 24.9 is normal.   A BMI of 25 to 29.9 is considered overweight.   A BMI of 30 and above is  considered obese.   Maintain normal blood lipids and cholesterol levels by exercising and minimizing your intake of saturated fat. Eat a balanced diet with plenty of fruit and vegetables. Blood tests for lipids and cholesterol should begin at age 20 and be repeated every 5 years. If your lipid or cholesterol levels are high, you are over 50, or you are at high risk for heart disease, you may need your cholesterol levels checked more frequently.Ongoing high lipid and cholesterol levels should be treated with medicines if diet and exercise are not effective.   If you smoke, find out from your caregiver how to quit. If you do not use tobacco, do not start.   If you are pregnant, do not drink alcohol. If you are breastfeeding, be very cautious about drinking alcohol. If you are not pregnant and choose to drink alcohol, do not exceed 1 drink per day. One drink is considered to be 12 ounces (355 mL) of beer, 5 ounces (148 mL) of wine, or 1.5 ounces (44 mL) of liquor.   Avoid use of street drugs. Do not share needles with anyone. Ask for help if you need support or instructions about stopping the use of drugs.   High blood pressure causes heart disease and increases the risk of stroke. Your blood pressure should be checked at least every 1 to 2 years. Ongoing high blood pressure should be treated with medicines if weight loss and exercise are not effective.   If you are 55 to 42   years old, ask your caregiver if you should take aspirin to prevent strokes.   Diabetes screening involves taking a blood sample to check your fasting blood sugar level. This should be done once every 3 years, after age 45, if you are within normal weight and without risk factors for diabetes. Testing should be considered at a younger age or be carried out more frequently if you are overweight and have at least 1 risk factor for diabetes.   Breast cancer screening is essential preventive care for women. You should practice "breast  self-awareness." This means understanding the normal appearance and feel of your breasts and may include breast self-examination. Any changes detected, no matter how small, should be reported to a caregiver. Women in their 20s and 30s should have a clinical breast exam (CBE) by a caregiver as part of a regular health exam every 1 to 3 years. After age 40, women should have a CBE every year. Starting at age 40, women should consider having a mammography (breast X-ray test) every year. Women who have a family history of breast cancer should talk to their caregiver about genetic screening. Women at a high risk of breast cancer should talk to their caregivers about having magnetic resonance imaging (MRI) and a mammography every year.   The Pap test is a screening test for cervical cancer. A Pap test can show cell changes on the cervix that might become cervical cancer if left untreated. A Pap test is a procedure in which cells are obtained and examined from the lower end of the uterus (cervix).   Women should have a Pap test starting at age 21.   Between ages 21 and 29, Pap tests should be repeated every 2 years.   Beginning at age 30, you should have a Pap test every 3 years as long as the past 3 Pap tests have been normal.   Some women have medical problems that increase the chance of getting cervical cancer. Talk to your caregiver about these problems. It is especially important to talk to your caregiver if a new problem develops soon after your last Pap test. In these cases, your caregiver may recommend more frequent screening and Pap tests.   The above recommendations are the same for women who have or have not gotten the vaccine for human papillomavirus (HPV).   If you had a hysterectomy for a problem that was not cancer or a condition that could lead to cancer, then you no longer need Pap tests. Even if you no longer need a Pap test, a regular exam is a good idea to make sure no other problems are  starting.   If you are between ages 65 and 70, and you have had normal Pap tests going back 10 years, you no longer need Pap tests. Even if you no longer need a Pap test, a regular exam is a good idea to make sure no other problems are starting.   If you have had past treatment for cervical cancer or a condition that could lead to cancer, you need Pap tests and screening for cancer for at least 20 years after your treatment.   If Pap tests have been discontinued, risk factors (such as a new sexual partner) need to be reassessed to determine if screening should be resumed.   The HPV test is an additional test that may be used for cervical cancer screening. The HPV test looks for the virus that can cause the cell changes on the cervix.   The cells collected during the Pap test can be tested for HPV. The HPV test could be used to screen women aged 30 years and older, and should be used in women of any age who have unclear Pap test results. After the age of 30, women should have HPV testing at the same frequency as a Pap test.   Colorectal cancer can be detected and often prevented. Most routine colorectal cancer screening begins at the age of 50 and continues through age 75. However, your caregiver may recommend screening at an earlier age if you have risk factors for colon cancer. On a yearly basis, your caregiver may provide home test kits to check for hidden blood in the stool. Use of a small camera at the end of a tube, to directly examine the colon (sigmoidoscopy or colonoscopy), can detect the earliest forms of colorectal cancer. Talk to your caregiver about this at age 50, when routine screening begins. Direct examination of the colon should be repeated every 5 to 10 years through age 75, unless early forms of pre-cancerous polyps or small growths are found.   Hepatitis C blood testing is recommended for all people born from 1945 through 1965 and any individual with known risks for hepatitis C.    Practice safe sex. Use condoms and avoid high-risk sexual practices to reduce the spread of sexually transmitted infections (STIs). STIs include gonorrhea, chlamydia, syphilis, trichomonas, herpes, HPV, and human immunodeficiency virus (HIV). Herpes, HIV, and HPV are viral illnesses that have no cure. They can result in disability, cancer, and death. Sexually active women aged 25 and younger should be checked for chlamydia. Older women with new or multiple partners should also be tested for chlamydia. Testing for other STIs is recommended if you are sexually active and at increased risk.   Osteoporosis is a disease in which the bones lose minerals and strength with aging. This can result in serious bone fractures. The risk of osteoporosis can be identified using a bone density scan. Women ages 65 and over and women at risk for fractures or osteoporosis should discuss screening with their caregivers. Ask your caregiver whether you should take a calcium supplement or vitamin D to reduce the rate of osteoporosis.   Menopause can be associated with physical symptoms and risks. Hormone replacement therapy is available to decrease symptoms and risks. You should talk to your caregiver about whether hormone replacement therapy is right for you.   Use sunscreen with sun protection factor (SPF) of 30 or more. Apply sunscreen liberally and repeatedly throughout the day. You should seek shade when your shadow is shorter than you. Protect yourself by wearing long sleeves, pants, a wide-brimmed hat, and sunglasses year round, whenever you are outdoors.   Once a month, do a whole body skin exam, using a mirror to look at the skin on your back. Notify your caregiver of new moles, moles that have irregular borders, moles that are larger than a pencil eraser, or moles that have changed in shape or color.   Stay current with required immunizations.   Influenza. You need a dose every fall (or winter). The composition of  the flu vaccine changes each year, so being vaccinated once is not enough.   Pneumococcal polysaccharide. You need 1 to 2 doses if you smoke cigarettes or if you have certain chronic medical conditions. You need 1 dose at age 65 (or older) if you have never been vaccinated.   Tetanus, diphtheria, pertussis (Tdap, Td). Get 1 dose of   Tdap vaccine if you are younger than age 65, are over 65 and have contact with an infant, are a healthcare worker, are pregnant, or simply want to be protected from whooping cough. After that, you need a Td booster dose every 10 years. Consult your caregiver if you have not had at least 3 tetanus and diphtheria-containing shots sometime in your life or have a deep or dirty wound.   HPV. You need this vaccine if you are a woman age 26 or younger. The vaccine is given in 3 doses over 6 months.   Measles, mumps, rubella (MMR). You need at least 1 dose of MMR if you were born in 1957 or later. You may also need a second dose.   Meningococcal. If you are age 19 to 21 and a first-year college student living in a residence hall, or have one of several medical conditions, you need to get vaccinated against meningococcal disease. You may also need additional booster doses.   Zoster (shingles). If you are age 60 or older, you should get this vaccine.   Varicella (chickenpox). If you have never had chickenpox or you were vaccinated but received only 1 dose, talk to your caregiver to find out if you need this vaccine.   Hepatitis A. You need this vaccine if you have a specific risk factor for hepatitis A virus infection or you simply wish to be protected from this disease. The vaccine is usually given as 2 doses, 6 to 18 months apart.   Hepatitis B. You need this vaccine if you have a specific risk factor for hepatitis B virus infection or you simply wish to be protected from this disease. The vaccine is given in 3 doses, usually over 6 months.  Preventive Services /  Frequency Ages 19 to 39  Blood pressure check.** / Every 1 to 2 years.   Lipid and cholesterol check.** / Every 5 years beginning at age 20.   Clinical breast exam.** / Every 3 years for women in their 20s and 30s.   Pap test.** / Every 2 years from ages 21 through 29. Every 3 years starting at age 30 through age 65 or 70 with a history of 3 consecutive normal Pap tests.   HPV screening.** / Every 3 years from ages 30 through ages 65 to 70 with a history of 3 consecutive normal Pap tests.   Hepatitis C blood test.** / For any individual with known risks for hepatitis C.   Skin self-exam. / Monthly.   Influenza immunization.** / Every year.   Pneumococcal polysaccharide immunization.** / 1 to 2 doses if you smoke cigarettes or if you have certain chronic medical conditions.   Tetanus, diphtheria, pertussis (Tdap, Td) immunization. / A one-time dose of Tdap vaccine. After that, you need a Td booster dose every 10 years.   HPV immunization. / 3 doses over 6 months, if you are 26 and younger.   Measles, mumps, rubella (MMR) immunization. / You need at least 1 dose of MMR if you were born in 1957 or later. You may also need a second dose.   Meningococcal immunization. / 1 dose if you are age 19 to 21 and a first-year college student living in a residence hall, or have one of several medical conditions, you need to get vaccinated against meningococcal disease. You may also need additional booster doses.   Varicella immunization.** / Consult your caregiver.   Hepatitis A immunization.** / Consult your caregiver. 2 doses, 6 to 18 months   apart.   Hepatitis B immunization.** / Consult your caregiver. 3 doses usually over 6 months.  Ages 40 to 64  Blood pressure check.** / Every 1 to 2 years.   Lipid and cholesterol check.** / Every 5 years beginning at age 20.   Clinical breast exam.** / Every year after age 40.   Mammogram.** / Every year beginning at age 40 and continuing for as  long as you are in good health. Consult with your caregiver.   Pap test.** / Every 3 years starting at age 30 through age 65 or 70 with a history of 3 consecutive normal Pap tests.   HPV screening.** / Every 3 years from ages 30 through ages 65 to 70 with a history of 3 consecutive normal Pap tests.   Fecal occult blood test (FOBT) of stool. / Every year beginning at age 50 and continuing until age 75. You may not need to do this test if you get a colonoscopy every 10 years.   Flexible sigmoidoscopy or colonoscopy.** / Every 5 years for a flexible sigmoidoscopy or every 10 years for a colonoscopy beginning at age 50 and continuing until age 75.   Hepatitis C blood test.** / For all people born from 1945 through 1965 and any individual with known risks for hepatitis C.   Skin self-exam. / Monthly.   Influenza immunization.** / Every year.   Pneumococcal polysaccharide immunization.** / 1 to 2 doses if you smoke cigarettes or if you have certain chronic medical conditions.   Tetanus, diphtheria, pertussis (Tdap, Td) immunization.** / A one-time dose of Tdap vaccine. After that, you need a Td booster dose every 10 years.   Measles, mumps, rubella (MMR) immunization. / You need at least 1 dose of MMR if you were born in 1957 or later. You may also need a second dose.   Varicella immunization.** / Consult your caregiver.   Meningococcal immunization.** / Consult your caregiver.   Hepatitis A immunization.** / Consult your caregiver. 2 doses, 6 to 18 months apart.   Hepatitis B immunization.** / Consult your caregiver. 3 doses, usually over 6 months.  Ages 65 and over  Blood pressure check.** / Every 1 to 2 years.   Lipid and cholesterol check.** / Every 5 years beginning at age 20.   Clinical breast exam.** / Every year after age 40.   Mammogram.** / Every year beginning at age 40 and continuing for as long as you are in good health. Consult with your caregiver.   Pap test.** /  Every 3 years starting at age 30 through age 65 or 70 with a 3 consecutive normal Pap tests. Testing can be stopped between 65 and 70 with 3 consecutive normal Pap tests and no abnormal Pap or HPV tests in the past 10 years.   HPV screening.** / Every 3 years from ages 30 through ages 65 or 70 with a history of 3 consecutive normal Pap tests. Testing can be stopped between 65 and 70 with 3 consecutive normal Pap tests and no abnormal Pap or HPV tests in the past 10 years.   Fecal occult blood test (FOBT) of stool. / Every year beginning at age 50 and continuing until age 75. You may not need to do this test if you get a colonoscopy every 10 years.   Flexible sigmoidoscopy or colonoscopy.** / Every 5 years for a flexible sigmoidoscopy or every 10 years for a colonoscopy beginning at age 50 and continuing until age 75.   Hepatitis   C blood test.** / For all people born from [birth year] through 1965 and any individual with known risks for hepatitis C.   Osteoporosis screening.** / A one-time screening for women ages 33 and over and women at risk for fractures or osteoporosis.   Skin self-exam. / Monthly.   Influenza immunization.** / Every year.   Pneumococcal polysaccharide immunization.** / 1 dose at age 105 (or older) if you have never been vaccinated.   Tetanus, diphtheria, pertussis (Tdap, Td) immunization. / A one-time dose of Tdap vaccine if you are over 65 and have contact with an infant, are a Research scientist (physical sciences), or simply want to be protected from whooping cough. After that, you need a Td booster dose every 10 years.   Varicella immunization.** / Consult your caregiver.   Meningococcal immunization.** / Consult your caregiver.   Hepatitis A immunization.** / Consult your caregiver. 2 doses, 6 to 18 months apart.   Hepatitis B immunization.** / Check with your caregiver. 3 doses, usually over 6 months.  ** Family history and personal history of risk and conditions may change your caregiver's  recommendations. Document Released: 03/14/2001 Document Revised: 01/05/2011 Document Reviewed: 06/13/2010 [facility] Patient Information 2012 [place], [place].  Start Ranitidine/Zantac at bed 150 mg if cough is persistent

## 2011-06-29 NOTE — Assessment & Plan Note (Signed)
Has been struggling with daily HA along her occiput for 3-4 months. Pain is essentially constant, no associated symptoms, has an appt with neurology soon. Consider occipital neurolgia. Instructed to try moist heat and gentle stretching, given Cyclobenzaprine to try qhs

## 2011-06-29 NOTE — Assessment & Plan Note (Signed)
Tolerating Hydroxychloroquine. Follows with rheumatology

## 2011-06-29 NOTE — Assessment & Plan Note (Signed)
Follows with electrophysiology and is on Toprol and Norvasc both

## 2011-06-29 NOTE — Assessment & Plan Note (Addendum)
Cefdinir bid, stop Qvar. Use Albuterol prn. Increase clear fluids and rest. Add Mucinex bid. Tussionex qhs r

## 2011-07-18 ENCOUNTER — Telehealth: Payer: Self-pay

## 2011-07-18 NOTE — Telephone Encounter (Signed)
Pt left a message stating that she is a Lupus patient and got a mouth infection yesterday and had to get surgery. Pt states she can't take Tylenol and would like a refill of her Oxycodone. Last RX wrote from [facility] for Oxy was to be refilled on 06-25-11 quantity 180.  Per MD patient informed to contact Dentist for pain medication or can make an appt to see [place] tomorrow.   Pt informed and states that she contacted the Dentist office and they stated the dentist that did her surgery can't write an RX for pain medication. Pt states that she understands and would like an appt for tomorrow morning. Appt scheduled for tomorrow morning.

## 2011-07-19 ENCOUNTER — Encounter: Payer: Self-pay | Admitting: Family Medicine

## 2011-07-19 ENCOUNTER — Ambulatory Visit (INDEPENDENT_AMBULATORY_CARE_PROVIDER_SITE_OTHER): Payer: BC Managed Care – PPO | Admitting: Family Medicine

## 2011-07-19 VITALS — BP 114/79 | HR 75 | Temp 97.7°F | Ht 65.5 in | Wt 161.4 lb

## 2011-07-19 DIAGNOSIS — R52 Pain, unspecified: Secondary | ICD-10-CM

## 2011-07-19 DIAGNOSIS — I10 Essential (primary) hypertension: Secondary | ICD-10-CM

## 2011-07-19 DIAGNOSIS — K089 Disorder of teeth and supporting structures, unspecified: Secondary | ICD-10-CM

## 2011-07-19 DIAGNOSIS — G8929 Other chronic pain: Secondary | ICD-10-CM

## 2011-07-19 DIAGNOSIS — K0889 Other specified disorders of teeth and supporting structures: Secondary | ICD-10-CM | POA: Insufficient documentation

## 2011-07-19 MED ORDER — OXYCODONE HCL 5 MG PO TABS: 5.0000 mg | ORAL_TABLET | ORAL | Status: DC | PRN

## 2011-07-19 NOTE — Assessment & Plan Note (Signed)
Adequately controlled despite pain 

## 2011-07-19 NOTE — Progress Notes (Signed)
Patient ID: Rebecca Boyle, female   DOB: November 15, 1970, 41 y.o.   MRN: [id number] Rebecca Boyle [id number] December 28, 1970 07/19/2011      Progress Note-Follow Up  Subjective  Chief Complaint  Chief Complaint  Patient presents with  . Medication Refill    Oxycodone    HPI  This 41 year old Caucasian female who is in today for increasing pain. He is without contrast her attitude recently tried to ignore it but eventually had such pain that she had to go have the tooth pulled last week. Since then the pain has been increasingly worse and she is actually run out of the oxycodone she's been on for years. She has lupus and is on multiple biologic agents and thus has been restricted in her use of over-the-counter pain medications. Oxycodone 5 mg tablets anywhere from 4-8 daily has helped her pain for 8 years now. Until the tooth started to act up it was working well and she does not believe long-term she needs to change her pain meds but presently due to her increased pain is requesting an increase. Her previous PMD did let her house to her formula but she never went beyond that. She does get pain relief temporarily when she takes her oxycodone. She denies fevers or chills. She denies malaise or myalgias secondary to infection. She continues to be under great deal of stress with elderly parents and a divorce process proceeding but overall she feels other than the tooth she is doing well.  Past Medical History  Diagnosis Date  . Arthritis     hands, hips, knees, back feet  . Depression   . Lupus 2004  . PONV (postoperative nausea and vomiting)   . Syncope, cardiogenic 2002    treated with toprol  . Anxiety   . Heart murmur   . Chicken pox as a child  . Hypertension   . Tachycardia 06/29/2011  . Anxiety and depression 01/04/2011  . Headache disorder 05/16/2011    Past Surgical History  Procedure Date  . Abdominal hysterectomy 2010    had uterus left ovary removed 2010, right ovary removed  8/12  . Cholecystectomy 2005  . Breast surgery 1995    lumpectomy of left breast- benign  . Right wrist nerve repair - 2008 2008    fell on nail, repair    Family History  Problem Relation Age of Onset  . Hyperlipidemia Mother   . Hypertension Mother   . Stroke Mother     X 5 mini  . Cancer Mother 29    cervical  . Heart disease Mother   . Hypertension Father   . Heart disease Father     cad with stent  . Arthritis Father     2 blown discs  . Stroke Maternal Grandmother   . Lupus Maternal Grandmother   . Heart disease Paternal Grandfather     History   Social History  . Marital Status: Legally Separated    Spouse Name: N/A    Number of Children: N/A  . Years of Education: college   Occupational History  . Not on file.   Social History Main Topics  . Smoking status: Current Everyday Smoker -- 0.2 packs/day for 10 years    Types: Cigarettes  . Smokeless tobacco: Never Used  . Alcohol Use: No  . Drug Use: No  . Sexually Active: No   Other Topics Concern  . 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 Smokes 2 cigarettes a dayDenies ETOH    Current Outpatient Prescriptions on File Prior to Visit  Medication Sig Dispense Refill  . albuterol (PROVENTIL HFA;VENTOLIN HFA) 108 (90 BASE) MCG/ACT inhaler Inhale 2 puffs into the lungs 4 (four) times daily. For exercise induced asthma      . amLODipine (NORVASC) 10 MG tablet Take 1 tablet (10 mg total) by mouth daily.  30 tablet  5  . beclomethasone (QVAR) 40 MCG/ACT inhaler Inhale 2 puffs into the lungs 2 (two) times daily.  1 Inhaler  3  . esomeprazole (NEXIUM) 40 MG capsule Take 1 capsule (40 mg total) by mouth daily before breakfast.  30 capsule  2  . Estradiol (ESTROGEL TD) Place 1 application onto the skin every evening.       Marland Kitchen guaiFENesin (MUCINEX) 600 MG 12 hr tablet Take  1 tablet (600 mg total) by mouth 2 (two) times daily.  20 tablet  0  . hydroxychloroquine (PLAQUENIL) 200 MG tablet Take 200 mg by mouth 2 (two) times daily.        Marland Kitchen LORazepam (ATIVAN) 1 MG tablet Take 1 tablet (1 mg total) by mouth 3 (three) times daily as needed. For anxiety  90 tablet  1  . metoprolol succinate (TOPROL-XL) 100 MG 24 hr tablet Take 1 tablet (100 mg total) by mouth daily.  30 tablet  2  . Multiple Vitamin (MULITIVITAMIN WITH MINERALS) TABS Take 1 tablet by mouth daily.      Marland Kitchen venlafaxine XR (EFFEXOR XR) 75 MG 24 hr capsule Take 1 capsule (75 mg total) by mouth daily. Start after the 37.5 mg is completed  30 capsule  1  . vitamin C (ASCORBIC ACID) 500 MG tablet Take 500 mg by mouth daily.      Marland Kitchen DISCONTD: venlafaxine XR (EFFEXOR XR) 37.5 MG 24 hr capsule Take 1 capsule (37.5 mg total) by mouth daily.  7 capsule  0    Allergies  Allergen Reactions  . Morphine And Related Shortness Of Breath  . Codeine Nausea And Vomiting    Review of Systems  Review of Systems  Constitutional: Positive for malaise/fatigue. Negative for fever and chills.  HENT: Positive for ear pain. Negative for congestion.        Dental pain, left lower molar excise, pain referring to left ear as well  Eyes: Negative for discharge.  Respiratory: Negative for shortness of breath.   Cardiovascular: Negative for chest pain, palpitations and leg swelling.  Gastrointestinal: Negative for nausea, abdominal pain and diarrhea.  Genitourinary: Negative for dysuria.  Musculoskeletal: Positive for myalgias. Negative for falls.  Skin: Negative for rash.  Neurological: Negative for loss of consciousness and headaches.  Endo/Heme/Allergies: Negative for polydipsia.  Psychiatric/Behavioral: Positive for depression. Negative for suicidal ideas. The patient is not nervous/anxious and does not have insomnia.     Objective  BP 114/79  Pulse 75  Temp 97.7 F (36.5 C) (Temporal)  Ht 5' 5.5" (1.664 m)  Wt 161 lb  6.4 oz (73.211 kg)  BMI 26.45 kg/m2  SpO2 99%  Physical Exam  Physical Exam  Constitutional: She is oriented to person, place, and time and well-developed, well-nourished, and in no distress. No distress.  HENT:  Head: Normocephalic and atraumatic.       Gums swollen and erythematous along lower, posterior gum line. Enlarged submandibular LN on left and a small  enlarged LN noted cervically on left  Eyes: Conjunctivae are normal.  Neck: Neck supple. No thyromegaly present.  Cardiovascular: Normal rate, regular rhythm and normal heart sounds.   No murmur heard. Pulmonary/Chest: Effort normal and breath sounds normal. She has no wheezes.  Abdominal: She exhibits no distension and no mass.  Musculoskeletal: She exhibits no edema.  Lymphadenopathy:    She has cervical adenopathy.  Neurological: She is alert and oriented to person, place, and time.  Skin: Skin is warm and dry. No rash noted. She is not diaphoretic.  Psychiatric: Memory, affect and judgment normal.    Lab Results  Component Value Date   TSH 0.44 06/29/2011   Lab Results  Component Value Date   WBC 7.1 06/29/2011   HGB 14.0 06/29/2011   HCT 42.5 06/29/2011   MCV 94.2 06/29/2011   PLT 207.0 06/29/2011   Lab Results  Component Value Date   CREATININE 0.6 06/29/2011   BUN 14 06/29/2011   NA 141 06/29/2011   K 3.7 06/29/2011   CL 108 06/29/2011   CO2 24 06/29/2011   Lab Results  Component Value Date   ALT 56* 06/29/2011   AST 35 06/29/2011   ALKPHOS 82 06/29/2011   BILITOT 0.3 06/29/2011   Lab Results  Component Value Date   CHOL 205* 06/29/2011   Lab Results  Component Value Date   HDL 47.30 06/29/2011   No results found for this basename: [facility]   Lab Results  Component Value Date   TRIG 111.0 06/29/2011   Lab Results  Component Value Date   CHOLHDL 4 06/29/2011     Assessment & Plan  Chronic pain Patient with a long history of lupus. She is on multiple medications in the past has not tolerated or not  been allowed to take Tylenol and NSAIDs frequently. Oxycodone 5 mg 4 to 8 tabs daily has helped her for 8 years now. She had been doing relatively well on 180 a month until recently breaking a tooth. She's not had a tooth removed and unfortunately has been increased pain and is hoping to have her medications increased temporarily. We will allow her to have one tab every 6 hours as needed and thus dispense 240 tabs for 30. We will reset her clock for that she may pick them up today and then 30 days from now again she has any further trouble she is to let us know. She is advised to proceed with the Z-Pak that her dentist has recently given her. She must return for further evaluation if she feels she has more pain or needs further adjustments in medications. We have rescheduled in 3 weeks' time to reevaluate.  HTN (hypertension) Adequately controlled despite pain  Pain, dental Just broke a tooth and had to have it pulled now suffering with increased pain, will allow increased pain med for the next month and then reconsider at next visit. She is encouraged to take the Zpak her dentist gave her.

## 2011-07-19 NOTE — Assessment & Plan Note (Signed)
Just broke a tooth and had to have it pulled now suffering with increased pain, will allow increased pain med for the next month and then reconsider at next visit. She is encouraged to take the Zpak her dentist gave her.

## 2011-07-19 NOTE — Assessment & Plan Note (Signed)
Patient with a long history of lupus. She is on multiple medications in the past has not tolerated or not been allowed to take Tylenol and NSAIDs frequently. Oxycodone 5 mg 4 to 8 tabs daily has helped her for 8 years now. She had been doing relatively well on 180 a month until recently breaking a tooth. She's not had a tooth removed and unfortunately has been increased pain and is hoping to have her medications increased temporarily. We will allow her to have one tab every 6 hours as needed and thus dispense 240 tabs for 30. We will reset her clock for that she may pick them up today and then 30 days from now again she has any further trouble she is to let us know. She is advised to proceed with the Z-Pak that her dentist has recently given her. She must return for further evaluation if she feels she has more pain or needs further adjustments in medications. We have rescheduled in 3 weeks' time to reevaluate.

## 2011-07-31 ENCOUNTER — Ambulatory Visit: Payer: BC Managed Care – PPO | Admitting: Neurology

## 2011-08-14 ENCOUNTER — Encounter: Payer: Self-pay | Admitting: Family Medicine

## 2011-08-14 ENCOUNTER — Ambulatory Visit (INDEPENDENT_AMBULATORY_CARE_PROVIDER_SITE_OTHER): Payer: BC Managed Care – PPO | Admitting: Family Medicine

## 2011-08-14 ENCOUNTER — Telehealth: Payer: Self-pay

## 2011-08-14 VITALS — BP 110/74 | HR 75 | Temp 97.6°F | Ht 65.5 in | Wt 163.8 lb

## 2011-08-14 DIAGNOSIS — R609 Edema, unspecified: Secondary | ICD-10-CM

## 2011-08-14 DIAGNOSIS — F419 Anxiety disorder, unspecified: Secondary | ICD-10-CM

## 2011-08-14 DIAGNOSIS — R600 Localized edema: Secondary | ICD-10-CM

## 2011-08-14 DIAGNOSIS — G8929 Other chronic pain: Secondary | ICD-10-CM

## 2011-08-14 DIAGNOSIS — R000 Tachycardia, unspecified: Secondary | ICD-10-CM

## 2011-08-14 DIAGNOSIS — I10 Essential (primary) hypertension: Secondary | ICD-10-CM

## 2011-08-14 DIAGNOSIS — R52 Pain, unspecified: Secondary | ICD-10-CM

## 2011-08-14 DIAGNOSIS — F341 Dysthymic disorder: Secondary | ICD-10-CM

## 2011-08-14 MED ORDER — OXYCODONE HCL 5 MG PO TABS: 5.0000 mg | ORAL_TABLET | ORAL | Status: DC | PRN

## 2011-08-14 MED ORDER — LORAZEPAM 1 MG PO TABS: 1.0000 mg | ORAL_TABLET | Freq: Four times a day (QID) | ORAL | Status: DC | PRN

## 2011-08-14 MED ORDER — FUROSEMIDE 10 MG/ML IJ SOLN
40.0000 mg | Freq: Once | INTRAMUSCULAR | Status: AC
  Administered 2011-08-14: 40 mg via INTRAMUSCULAR

## 2011-08-14 MED ORDER — FUROSEMIDE 20 MG PO TABS: ORAL_TABLET | ORAL | Status: DC

## 2011-08-14 NOTE — Patient Instructions (Addendum)
Edema Edema is an abnormal build-up of fluids in tissues. Because this is partly dependent on gravity (water flows to the lowest place), it is more common in the leg sand thighs (lower extremities). It is also common in the looser tissues, like around the eyes. Painless swelling of the feet and ankles is common and increases as a person ages. It may affect both legs and may include the calves or even thighs. When squeezed, the fluid may move out of the affected area and may leave a dent for a few moments. CAUSES   Prolonged standing or sitting in one place for extended periods of time. Movement helps pump tissue fluid into the veins, and absence of movement prevents this, resulting in edema.   Varicose veins. The valves in the veins do not work as well as they should. This causes fluid to leak into the tissues.   Fluid and salt overload.   Injury, burn, or surgery to the leg, ankle, or foot, may damage veins and allow fluid to leak out.   Sunburn damages vessels. Leaky vessels allow fluid to go out into the sunburned tissues.   Allergies (from insect bites or stings, medications or chemicals) cause swelling by allowing vessels to become leaky.   Protein in the blood helps keep fluid in your vessels. Low protein, as in malnutrition, allows fluid to leak out.   Hormonal changes, including pregnancy and menstruation, cause fluid retention. This fluid may leak out of vessels and cause edema.   Medications that cause fluid retention. Examples are sex hormones, blood pressure medications, steroid treatment, or anti-depressants.   Some illnesses cause edema, especially heart failure, kidney disease, or liver disease.   Surgery that cuts veins or lymph nodes, such as surgery done for the heart or for breast cancer, may result in edema.  DIAGNOSIS  Your caregiver is usually easily able to determine what is causing your swelling (edema) by simply asking what is wrong (getting a history) and examining  you (doing a physical). Sometimes x-rays, EKG (electrocardiogram or heart tracing), and blood work may be done to evaluate for underlying medical illness. TREATMENT  General treatment includes:  Leg elevation (or elevation of the affected body part).   Restriction of fluid intake.   Prevention of fluid overload.   Compression of the affected body part. Compression with elastic bandages or support stockings squeezes the tissues, preventing fluid from entering and forcing it back into the blood vessels.   Diuretics (also called water pills or fluid pills) pull fluid out of your body in the form of increased urination. These are effective in reducing the swelling, but can have side effects and must be used only under your caregiver's supervision. Diuretics are appropriate only for some types of edema.  The specific treatment can be directed at any underlying causes discovered. Heart, liver, or kidney disease should be treated appropriately. HOME CARE INSTRUCTIONS   Elevate the legs (or affected body part) above the level of the heart, while lying down.   Avoid sitting or standing still for prolonged periods of time.   Avoid putting anything directly under the knees when lying down, and do not wear constricting clothing or garters on the upper legs.   Exercising the legs causes the fluid to work back into the veins and lymphatic channels. This may help the swelling go down.   The pressure applied by elastic bandages or support stockings can help reduce ankle swelling.   A low-salt diet may help reduce fluid   retention and decrease the ankle swelling.   Take any medications exactly as prescribed.  SEEK MEDICAL CARE IF:  Your edema is not responding to recommended treatments. SEEK IMMEDIATE MEDICAL CARE IF:   You develop shortness of breath or chest pain.   You cannot breathe when you lay down; or if, while lying down, you have to get up and go to the window to get your breath.   You  are having increasing swelling without relief from treatment.   You develop a fever over 102 F (38.9 C).   You develop pain or redness in the areas that are swollen.   Tell your caregiver right away if you have gained 3 lb/1.4 kg in 1 day or 5 lb/2.3 kg in a week.  MAKE SURE YOU:   Understand these instructions.   Will watch your condition.   Will get help right away if you are not doing well or get worse.  Document Released: 01/16/2005 Document Revised: 01/05/2011 Document Reviewed: 09/04/2007 [employer] Patient Information 2012 [employer]. 

## 2011-08-14 NOTE — Assessment & Plan Note (Signed)
Given a refill on oxycodone today.

## 2011-08-14 NOTE — Telephone Encounter (Signed)
Viviann Spare at [facility] called to verify that pt could get her Ativan and Oxycodone refilled early. Per MD this is okay because patient is going out of the country,

## 2011-08-14 NOTE — Progress Notes (Signed)
Patient ID: Rebecca Boyle, female   DOB: Apr 29, 1970, 41 y.o.   MRN: [id number] Rebecca Boyle [id number] 11-Jul-1970 08/14/2011      Progress Note-Follow Up  Subjective  Chief Complaint  Chief Complaint  Patient presents with  . Leg Swelling    and in hands X 4-5 days    HPI  Patient is a 41 year old Caucasian female who is in today for evaluation of edema. The edema has been in bilateral hands and bilateral legs and feet for about 5 days now. She denies any change in diet or medications. She has been under a great deal of stress last week secondary to her daughter just married this past weekend but denies that she changed what she ate during this process. She is restricting her sodium and is still having swelling. She denies shortness of breath, chest pain, palpitations, recent illness, GI or GU complaints. 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 Arthritis     hands, hips, knees, back feet  . Depression   . Lupus 2004  . PONV (postoperative nausea and vomiting)   . Syncope, cardiogenic 2002    treated with toprol  . Anxiety   . Heart murmur   . Chicken pox as a child  . Hypertension   . Tachycardia 06/29/2011  . Anxiety and depression 01/04/2011  . Headache disorder 05/16/2011  . Pain, dental 07/19/2011  . Pedal edema 08/14/2011    Past Surgical History  Procedure Date  . Abdominal hysterectomy 2010    had uterus left ovary removed 2010, right ovary removed 8/12  . Cholecystectomy 2005  . Breast surgery 1995    lumpectomy of left breast- benign  . Right wrist nerve repair - 2008 2008    fell on nail, repair    Family History  Problem Relation Age of Onset  . Hyperlipidemia Mother   . Hypertension Mother   . Stroke Mother     X 5 mini  . Cancer Mother 103    cervical  . Heart disease Mother   . Hypertension Father   . Heart disease Father    cad with stent  . Arthritis Father     2 blown discs  . Stroke Maternal Grandmother   . Lupus Maternal Grandmother   . Heart disease Paternal Grandfather     History   Social History  . Marital Status: Legally Separated    Spouse Name: N/A    Number of Children: N/A  . Years of Education: college   Occupational History  . Not on file.   Social History Main Topics  . Smoking status: Current Everyday Smoker -- 0.2 packs/day for 10 years    Types: Cigarettes  . Smokeless tobacco: Never Used  . Alcohol Use: No  . Drug Use: No  . Sexually Active: No   Other Topics Concern  . 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 Smokes 2 cigarettes a dayDenies ETOH    Current Outpatient Prescriptions on File Prior to Visit  Medication Sig Dispense Refill  . albuterol (PROVENTIL HFA;VENTOLIN HFA) 108 (90 BASE) MCG/ACT inhaler Inhale 2 puffs into the lungs 4 (four) times daily. For exercise induced asthma      .  amLODipine (NORVASC) 10 MG tablet Take 1 tablet (10 mg total) by mouth daily.  30 tablet  5  . beclomethasone (QVAR) 40 MCG/ACT inhaler Inhale 2 puffs into the lungs 2 (two) times daily.  1 Inhaler  3  . cyclobenzaprine (FLEXERIL) 10 MG tablet       . esomeprazole (NEXIUM) 40 MG capsule Take 1 capsule (40 mg total) by mouth daily before breakfast.  30 capsule  2  . Estradiol (ESTROGEL TD) Place 1 application onto the skin every evening.       Marland Kitchen guaiFENesin (MUCINEX) 600 MG 12 hr tablet Take 1 tablet (600 mg total) by mouth 2 (two) times daily.  20 tablet  0  . hydroxychloroquine (PLAQUENIL) 200 MG tablet Take 200 mg by mouth 2 (two) times daily.        . metoprolol succinate (TOPROL-XL) 100 MG 24 hr tablet Take 1 tablet (100 mg total) by mouth daily.  30 tablet  2  . Multiple Vitamin (MULITIVITAMIN WITH MINERALS) TABS Take 1 tablet by mouth  daily.      . SUMAtriptan (IMITREX) 50 MG tablet       . venlafaxine XR (EFFEXOR XR) 75 MG 24 hr capsule Take 1 capsule (75 mg total) by mouth daily. Start after the 37.5 mg is completed  30 capsule  1  . vitamin C (ASCORBIC ACID) 500 MG tablet Take 500 mg by mouth daily.      . furosemide (LASIX) 20 MG tablet 2 tabs po daily x 4 days then 1-2 tabs po daily as needed  30 tablet  1   No current facility-administered medications on file prior to visit.    Allergies  Allergen Reactions  . Morphine And Related Shortness Of Breath  . Codeine Nausea And Vomiting    Review of Systems  Review of Systems  Constitutional: Negative for fever and malaise/fatigue.  HENT: Negative for congestion.   Eyes: Negative for discharge.  Respiratory: Negative for shortness of breath.   Cardiovascular: Positive for leg swelling. Negative for chest pain and palpitations.  Gastrointestinal: Negative for nausea, abdominal pain and diarrhea.  Genitourinary: Negative for dysuria.  Musculoskeletal: Negative for falls.  Skin: Negative for rash.  Neurological: Negative for loss of consciousness and headaches.  Endo/Heme/Allergies: Negative for polydipsia.  Psychiatric/Behavioral: Negative for depression and suicidal ideas. The patient is not nervous/anxious and does not have insomnia.     Objective  BP 110/74  Pulse 75  Temp 97.6 F (36.4 C) (Temporal)  Ht 5' 5.5" (1.664 m)  Wt 163 lb 12.8 oz (74.299 kg)  BMI 26.84 kg/m2  SpO2 98%  Physical Exam  Physical Exam  Constitutional: She is oriented to person, place, and time and well-developed, well-nourished, and in no distress. No distress.  HENT:  Head: Normocephalic and atraumatic.  Eyes: Conjunctivae are normal.  Neck: Neck supple. No thyromegaly present.  Cardiovascular: Normal rate, regular rhythm and normal heart sounds.   No murmur heard. Pulmonary/Chest: Effort normal and breath sounds normal. She has no wheezes.  Abdominal: She exhibits no  distension and no mass.  Musculoskeletal: She exhibits edema.       Edema b/l hands, b/l LE 1+  Lymphadenopathy:    She has no cervical adenopathy.  Neurological: She is alert and oriented to person, place, and time.  Skin: Skin is warm and dry. No rash noted. She is not diaphoretic.  Psychiatric: Memory, affect and judgment normal.    Lab Results  Component Value Date  TSH 0.44 06/29/2011   Lab Results  Component Value Date   WBC 7.1 06/29/2011   HGB 14.0 06/29/2011   HCT 42.5 06/29/2011   MCV 94.2 06/29/2011   PLT 207.0 06/29/2011   Lab Results  Component Value Date   CREATININE 0.6 06/29/2011   BUN 14 06/29/2011   NA 141 06/29/2011   K 3.7 06/29/2011   CL 108 06/29/2011   CO2 24 06/29/2011   Lab Results  Component Value Date   ALT 56* 06/29/2011   AST 35 06/29/2011   ALKPHOS 82 06/29/2011   BILITOT 0.3 06/29/2011   Lab Results  Component Value Date   CHOL 205* 06/29/2011   Lab Results  Component Value Date   HDL 47.30 06/29/2011   No results found for this basename: [facility]   Lab Results  Component Value Date   TRIG 111.0 06/29/2011   Lab Results  Component Value Date   CHOLHDL 4 06/29/2011     Assessment & Plan  Tachycardia Well controlled today  HTN (hypertension) Well controlled no changes  Pedal edema Has had trouble for roughly 5 days ago and had a great deal of stress with her daughter getting married this past week. She denies dietary changes but has had edema in her hands and her legs for about 5 days now. Denies shortness of breath or any other concerning symptoms. She is given a dose of Lasix 40 mg IM in the office and then started on Lasix 40 mg daily for the next 4 days and then as needed. Given a handout on high potassium foods and asked to increase that. She is actually heading out of the country tomorrow for vacation she will return here when she gets back so we can recheck her weight and reevaluate her symptoms.  Chronic pain Given a refill on  oxycodone today.  Anxiety and depression Is leaving on vacation up tomorrow out of the country. Will allow her to pick up her alprazolam somewhat earlier this month to cover her while she is gone. We will change the dosing to 4 times a day temporarily with her understanding we will keep a 3 times a day dosing long-term and change the prescription when she needs a refill. She expresses understanding

## 2011-08-14 NOTE — Assessment & Plan Note (Signed)
Is leaving on vacation up tomorrow out of the country. Will allow her to pick up her alprazolam somewhat earlier this month to cover her while she is gone. We will change the dosing to 4 times a day temporarily with her understanding we will keep a 3 times a day dosing long-term and change the prescription when she needs a refill. She expresses understanding

## 2011-08-14 NOTE — Assessment & Plan Note (Signed)
Well controlled no changes 

## 2011-08-14 NOTE — Assessment & Plan Note (Signed)
Has had trouble for roughly 5 days ago and had a great deal of stress with her daughter getting married this past week. She denies dietary changes but has had edema in her hands and her legs for about 5 days now. Denies shortness of breath or any other concerning symptoms. She is given a dose of Lasix 40 mg IM in the office and then started on Lasix 40 mg daily for the next 4 days and then as needed. Given a handout on high potassium foods and asked to increase that. She is actually heading out of the country tomorrow for vacation she will return here when she gets back so we can recheck her weight and reevaluate her symptoms.

## 2011-08-14 NOTE — Assessment & Plan Note (Signed)
Well controlled today.

## 2011-08-14 NOTE — Telephone Encounter (Signed)
agree

## 2011-08-19 ENCOUNTER — Emergency Department (HOSPITAL_BASED_OUTPATIENT_CLINIC_OR_DEPARTMENT_OTHER): Payer: BC Managed Care – PPO

## 2011-08-19 ENCOUNTER — Encounter (HOSPITAL_BASED_OUTPATIENT_CLINIC_OR_DEPARTMENT_OTHER): Payer: Self-pay | Admitting: *Deleted

## 2011-08-19 ENCOUNTER — Emergency Department (HOSPITAL_BASED_OUTPATIENT_CLINIC_OR_DEPARTMENT_OTHER)
Admission: EM | Admit: 2011-08-19 | Discharge: 2011-08-19 | Disposition: A | Discharge status: Home / Self Care | Payer: BC Managed Care – PPO | Attending: Emergency Medicine | Admitting: Emergency Medicine

## 2011-08-19 DIAGNOSIS — W19XXXA Unspecified fall, initial encounter: Secondary | ICD-10-CM | POA: Insufficient documentation

## 2011-08-19 DIAGNOSIS — I10 Essential (primary) hypertension: Secondary | ICD-10-CM | POA: Insufficient documentation

## 2011-08-19 DIAGNOSIS — S63509A Unspecified sprain of unspecified wrist, initial encounter: Secondary | ICD-10-CM | POA: Insufficient documentation

## 2011-08-19 DIAGNOSIS — T148XXA Other injury of unspecified body region, initial encounter: Secondary | ICD-10-CM | POA: Insufficient documentation

## 2011-08-19 DIAGNOSIS — IMO0002 Reserved for concepts with insufficient information to code with codable children: Secondary | ICD-10-CM | POA: Insufficient documentation

## 2011-08-19 DIAGNOSIS — Y93K1 Activity, walking an animal: Secondary | ICD-10-CM | POA: Insufficient documentation

## 2011-08-19 MED ORDER — IBUPROFEN 800 MG PO TABS
800.0000 mg | ORAL_TABLET | Freq: Once | ORAL | Status: AC
  Administered 2011-08-19: 800 mg via ORAL

## 2011-08-19 MED ORDER — IBUPROFEN 800 MG PO TABS
ORAL_TABLET | ORAL | Status: AC
  Administered 2011-08-19: 800 mg via ORAL
  Filled 2011-08-19: qty 1

## 2011-08-19 MED ORDER — ONDANSETRON 4 MG PO TBDP
4.0000 mg | ORAL_TABLET | Freq: Once | ORAL | Status: AC
  Administered 2011-08-19: 4 mg via ORAL
  Filled 2011-08-19: qty 1

## 2011-08-19 NOTE — ED Notes (Signed)
Patient states needs something for pain.

## 2011-08-19 NOTE — ED Provider Notes (Signed)
Medical screening examination/treatment/procedure(s) were performed by non-physician practitioner and as supervising physician I was immediately available for consultation/collaboration.   Rolan Bucco, MD 08/19/11 [phone]

## 2011-08-19 NOTE — ED Notes (Signed)
No new Rx- d/c home with friend to drive
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
## 2011-08-19 NOTE — ED Notes (Signed)
Patient came back and asked for an arm sling, I gave, fit and adjusted and placed charge sheet on file.
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
## 2011-08-25 ENCOUNTER — Ambulatory Visit: Payer: BC Managed Care – PPO | Admitting: Family

## 2011-08-25 ENCOUNTER — Ambulatory Visit: Payer: BC Managed Care – PPO | Admitting: Family Medicine

## 2011-08-28 ENCOUNTER — Other Ambulatory Visit: Payer: Self-pay | Admitting: Family

## 2011-08-28 ENCOUNTER — Ambulatory Visit (INDEPENDENT_AMBULATORY_CARE_PROVIDER_SITE_OTHER): Payer: BC Managed Care – PPO | Admitting: Family Medicine

## 2011-08-28 ENCOUNTER — Encounter: Payer: Self-pay | Admitting: Family Medicine

## 2011-08-28 VITALS — BP 99/65 | HR 72 | Temp 98.1°F | Resp 14 | Ht 66.0 in | Wt 166.2 lb

## 2011-08-28 DIAGNOSIS — I10 Essential (primary) hypertension: Secondary | ICD-10-CM

## 2011-08-28 DIAGNOSIS — J329 Chronic sinusitis, unspecified: Secondary | ICD-10-CM

## 2011-08-28 DIAGNOSIS — J019 Acute sinusitis, unspecified: Secondary | ICD-10-CM

## 2011-08-28 MED ORDER — HYDROCOD POLST-CHLORPHEN POLST 10-8 MG/5ML PO LQCR: 5.0000 mL | Freq: Two times a day (BID) | ORAL | Status: DC | PRN

## 2011-08-28 MED ORDER — SALINE NASAL SPRAY 0.65 % NA SOLN: 1.0000 | NASAL | Status: DC | PRN

## 2011-08-28 MED ORDER — BENZONATATE 200 MG PO CAPS: 200.0000 mg | ORAL_CAPSULE | Freq: Two times a day (BID) | ORAL | Status: AC | PRN

## 2011-08-28 MED ORDER — AMOXICILLIN-POT CLAVULANATE 875-125 MG PO TABS: 1.0000 | ORAL_TABLET | Freq: Two times a day (BID) | ORAL | Status: DC

## 2011-08-28 MED ORDER — GUAIFENESIN ER 600 MG PO TB12: 1200.0000 mg | ORAL_TABLET | Freq: Two times a day (BID) | ORAL | Status: DC

## 2011-08-28 NOTE — Patient Instructions (Addendum)

## 2011-08-29 ENCOUNTER — Encounter: Payer: Self-pay | Admitting: Family Medicine

## 2011-08-29 DIAGNOSIS — J019 Acute sinusitis, unspecified: Secondary | ICD-10-CM | POA: Insufficient documentation

## 2011-08-29 NOTE — Telephone Encounter (Signed)
eScribe request for refill on Imitrex Last filled - never filled by our office Last seen on - 08/28/11 sinusitis Please advise refills.

## 2011-08-29 NOTE — Assessment & Plan Note (Signed)
Adequately controlled 

## 2011-08-29 NOTE — Assessment & Plan Note (Signed)
Augmentin, tessalon perles. Mucinex, increase rest and fluids, saline nasal spray

## 2011-08-29 NOTE — Progress Notes (Signed)
Patient ID: Rebecca Boyle, female   DOB: 01-Jul-1970, 41 y.o.   MRN: [id number] Rebecca Boyle [id number] 1970/03/31 08/29/2011      Progress Note-Follow Up  Subjective  Chief Complaint  Chief Complaint  Patient presents with  . Sinusitis    Pt c/o sinus pain & pressure, pressure in ears, sinus & respiratory congestion, cough; sinus mucus: yellow-green, chest phlegm: yellow x5 days.    HPI  Patient is a 41 year old Caucasian female who is in today with several days worth of worsening congestion. She's complaining of headache and sinus congestion and pressure as well as chest congestion. Some low-grade fevers chills as well as myalgias severe pain and crackling. She is reporting some hoarseness in her throat postnasal drip. Patient notes cough is often productive of yellow phlegm. Some of them has about 5 days  Past Medical History  Diagnosis Date  . Arthritis     hands, hips, knees, back feet  . Depression   . Lupus 2004  . PONV (postoperative nausea and vomiting)   . Syncope, cardiogenic 2002    treated with toprol  . Anxiety   . Heart murmur   . Chicken pox as a child  . Hypertension   . Tachycardia 06/29/2011  . Anxiety and depression 01/04/2011  . Headache disorder 05/16/2011  . Pain, dental 07/19/2011  . Pedal edema 08/14/2011  . Sinusitis acute 08/29/2011    Past Surgical History  Procedure Date  . Abdominal hysterectomy 2010    had uterus left ovary removed 2010, right ovary removed 8/12  . Cholecystectomy 2005  . Breast surgery 1995    lumpectomy of left breast- benign  . Right wrist nerve repair - 2008 2008    fell on nail, repair    Family History  Problem Relation Age of Onset  . Hyperlipidemia Mother   . Hypertension Mother   . Stroke Mother     X 5 mini  . Cancer Mother 24    cervical  . Heart disease Mother   . Hypertension Father   . Heart disease Father     cad with stent  . Arthritis Father     2 blown discs  . Stroke Maternal  Grandmother   . Lupus Maternal Grandmother   . Heart disease Paternal Grandfather     History   Social History  . Marital Status: Legally Separated    Spouse Name: N/A    Number of Children: N/A  . Years of Education: college   Occupational History  . Not on file.   Social History Main Topics  . Smoking status: Current Some Day Smoker -- 0.2 packs/day for 10 years    Types: Cigarettes  . Smokeless tobacco: Never Used  . Alcohol Use: No  . Drug Use: No  . Sexually Active: No   Other Topics Concern  . 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 Smokes 2 cigarettes a dayDenies ETOH    Current Outpatient Prescriptions on File Prior to Visit  Medication Sig Dispense Refill  . amLODipine (NORVASC) 10 MG tablet Take 1 tablet (10 mg total) by mouth daily.  30 tablet  5  . esomeprazole (NEXIUM) 40 MG capsule Take 1 capsule (40 mg total) by mouth daily before breakfast.  30 capsule  2  . Estradiol (ESTROGEL TD) Place 1 application onto  the skin every evening.       . furosemide (LASIX) 20 MG tablet 2 tabs po daily x 4 days then 1-2 tabs po daily as needed  30 tablet  1  . hydroxychloroquine (PLAQUENIL) 200 MG tablet Take 200 mg by mouth 2 (two) times daily.        Marland Kitchen ibuprofen (ADVIL,MOTRIN) 200 MG tablet Take 400 mg by mouth every 6 (six) hours as needed. For pain.      Marland Kitchen LORazepam (ATIVAN) 1 MG tablet Take 1 tablet (1 mg total) by mouth every 6 (six) hours as needed. For anxiety  90 tablet  1  . metoprolol succinate (TOPROL-XL) 100 MG 24 hr tablet Take 1 tablet (100 mg total) by mouth daily.  30 tablet  2  . Multiple Vitamin (MULITIVITAMIN WITH MINERALS) TABS Take 1 tablet by mouth daily.      Marland Kitchen oxyCODONE (OXY IR/ROXICODONE) 5 MG immediate release tablet Take 1 tablet (5 mg total) by mouth every 3 (three) hours as needed for pain. Max of 8 tabs  in 24 hours  240 tablet  0  . Potassium 99 MG TABS Take 1 tablet by mouth daily.      Marland Kitchen venlafaxine XR (EFFEXOR XR) 75 MG 24 hr capsule Take 1 capsule (75 mg total) by mouth daily. Start after the 37.5 mg is completed  30 capsule  1  . vitamin C (ASCORBIC ACID) 500 MG tablet Take 500 mg by mouth daily.      . sodium chloride (AYR) 0.65 % nasal spray Place 1 spray into the nose as needed for congestion.  30 mL  12    Allergies  Allergen Reactions  . Morphine And Related Shortness Of Breath  . Codeine Nausea And Vomiting    Review of Systems  Review of Systems  Constitutional: Positive for fever, chills and malaise/fatigue.  HENT: Positive for ear pain, congestion, sore throat and ear discharge.   Eyes: Negative for discharge.  Respiratory: Positive for cough and sputum production. Negative for shortness of breath and wheezing.   Cardiovascular: Negative for chest pain, palpitations and leg swelling.  Gastrointestinal: Positive for abdominal pain. Negative for nausea and diarrhea.  Genitourinary: Negative for dysuria.  Musculoskeletal: Negative for falls.  Skin: Negative for rash.  Neurological: Negative for loss of consciousness and headaches.  Endo/Heme/Allergies: Negative for polydipsia.  Psychiatric/Behavioral: Negative for depression and suicidal ideas. The patient is not nervous/anxious and does not have insomnia.     Objective  BP 99/65  Pulse 72  Temp 98.1 F (36.7 C) (Oral)  Resp 14  Ht 5\' 6"  (1.676 m)  Wt 166 lb 4 oz (75.411 kg)  BMI 26.83 kg/m2  SpO2 96%  Physical Exam  Physical Exam  Constitutional: She is oriented to person, place, and time and well-developed, well-nourished, and in no distress. No distress.  HENT:  Head: Normocephalic and atraumatic.       Nasal mucosa boggy and erythematous  Eyes: Conjunctivae are normal.  Neck: Neck supple. No thyromegaly present.  Cardiovascular: Normal rate, regular rhythm and normal heart sounds.   No murmur  heard. Pulmonary/Chest: Effort normal and breath sounds normal. She has no wheezes.  Abdominal: She exhibits no distension and no mass.  Musculoskeletal: She exhibits no edema.  Lymphadenopathy:    She has no cervical adenopathy.  Neurological: She is alert and oriented to person, place, and time.  Skin: Skin is warm and dry. No rash noted. She is not diaphoretic.  Psychiatric: Memory,  affect and judgment normal.    Lab Results  Component Value Date   TSH 0.44 06/29/2011   Lab Results  Component Value Date   WBC 7.1 06/29/2011   HGB 14.0 06/29/2011   HCT 42.5 06/29/2011   MCV 94.2 06/29/2011   PLT 207.0 06/29/2011   Lab Results  Component Value Date   CREATININE 0.6 06/29/2011   BUN 14 06/29/2011   NA 141 06/29/2011   K 3.7 06/29/2011   CL 108 06/29/2011   CO2 24 06/29/2011   Lab Results  Component Value Date   ALT 56* 06/29/2011   AST 35 06/29/2011   ALKPHOS 82 06/29/2011   BILITOT 0.3 06/29/2011   Lab Results  Component Value Date   CHOL 205* 06/29/2011   Lab Results  Component Value Date   HDL 47.30 06/29/2011   No results found for this basename: [facility]   Lab Results  Component Value Date   TRIG 111.0 06/29/2011   Lab Results  Component Value Date   CHOLHDL 4 06/29/2011     Assessment & Plan  HTN (hypertension) Adequately controlled  Sinusitis acute Augmentin, tessalon perles. Mucinex, increase rest and fluids, saline nasal spray

## 2011-09-07 ENCOUNTER — Other Ambulatory Visit: Payer: Self-pay | Admitting: Family Medicine

## 2011-09-07 DIAGNOSIS — R52 Pain, unspecified: Secondary | ICD-10-CM

## 2011-09-07 MED ORDER — OXYCODONE HCL 5 MG PO TABS: 5.0000 mg | ORAL_TABLET | ORAL | Status: DC | PRN

## 2011-09-07 NOTE — Telephone Encounter (Signed)
She can pick up the refill with same sig, same number tomorrow afternoon

## 2011-09-07 NOTE — Telephone Encounter (Signed)
I will print RX and inform patient

## 2011-09-07 NOTE — Telephone Encounter (Signed)
Please advise refill? Last RX wrote on 08-14-11 quantity 240 with 0 refills.  Patient should not be out of medication yet. MD usually gave patient 180 until last month went to 240. I will inform patient.  Patient states she still has medication and won't be out until 8-13 or 8-14. Pt was just giving [place] a few days notice.

## 2011-09-08 ENCOUNTER — Telehealth: Payer: Self-pay

## 2011-09-08 NOTE — Telephone Encounter (Signed)
So we have already increased her amount recently. What I would say to her is she allowed 8 a day, if she needs to take 10 instead of 8 for a few days that is OK but then she will have to compensate as she feels better by getting by with 6 to catch up. If she feels she needs an actual change in the strength of the tab that will require an office visit and drug contract and testing for safety purposes.

## 2011-09-08 NOTE — Telephone Encounter (Signed)
Pt walked in and filled out a walk in patients sheet stating "this bacterial infection has taken a major toll on me since it started." Pt stated her fluid is much worse, thus making my joint pain excruciating around the clock. Pt stated she has taken the lowest dose 5 mg Oxy for 9 yrs. Pt would like to know is there any way possible she could move to 10 mg until this infection and illness passes? Pt states she has always stuck to her dose, but it is just not sufficient at this time.   Pt was waiting in the waiting room and I had Diane tell her we would have to call her with an answer or advise.  Pt stated to me yesterday that she wasn't out of her medication yet and never said anything about an increase?  Pt did pick up her new RX from Diane  Pt was getting 180 tablets then this month and last has got 240 tablets of Oxy.

## 2011-09-08 NOTE — Telephone Encounter (Signed)
Pt informed and voiced understanding

## 2011-09-11 ENCOUNTER — Other Ambulatory Visit: Payer: Self-pay

## 2011-09-11 DIAGNOSIS — J329 Chronic sinusitis, unspecified: Secondary | ICD-10-CM

## 2011-09-11 NOTE — Telephone Encounter (Signed)
Last RX wrote on 08-28-11 quantity 10 with 1 refill.

## 2011-09-13 ENCOUNTER — Encounter: Payer: Self-pay | Admitting: Family Medicine

## 2011-09-13 ENCOUNTER — Ambulatory Visit (INDEPENDENT_AMBULATORY_CARE_PROVIDER_SITE_OTHER): Payer: BC Managed Care – PPO | Admitting: Family Medicine

## 2011-09-13 ENCOUNTER — Telehealth: Payer: Self-pay | Admitting: Family Medicine

## 2011-09-13 VITALS — BP 142/89 | HR 88 | Temp 97.6°F | Ht 65.0 in | Wt 163.1 lb

## 2011-09-13 DIAGNOSIS — I10 Essential (primary) hypertension: Secondary | ICD-10-CM

## 2011-09-13 DIAGNOSIS — F419 Anxiety disorder, unspecified: Secondary | ICD-10-CM

## 2011-09-13 DIAGNOSIS — J40 Bronchitis, not specified as acute or chronic: Secondary | ICD-10-CM

## 2011-09-13 DIAGNOSIS — F329 Major depressive disorder, single episode, unspecified: Secondary | ICD-10-CM

## 2011-09-13 DIAGNOSIS — F341 Dysthymic disorder: Secondary | ICD-10-CM

## 2011-09-13 MED ORDER — ALBUTEROL SULFATE HFA 108 (90 BASE) MCG/ACT IN AERS: 2.0000 | INHALATION_SPRAY | RESPIRATORY_TRACT | Status: DC | PRN

## 2011-09-13 MED ORDER — LEVOFLOXACIN 500 MG PO TABS: 500.0000 mg | ORAL_TABLET | Freq: Every day | ORAL | Status: AC

## 2011-09-13 MED ORDER — HYDROCOD POLST-CHLORPHEN POLST 10-8 MG/5ML PO LQCR: 5.0000 mL | Freq: Two times a day (BID) | ORAL | Status: DC | PRN

## 2011-09-13 MED ORDER — METHYLPREDNISOLONE 4 MG PO KIT: PACK | ORAL | Status: AC

## 2011-09-13 MED ORDER — VENLAFAXINE HCL ER 150 MG PO CP24: 150.0000 mg | ORAL_CAPSULE | Freq: Every day | ORAL | Status: DC

## 2011-09-13 NOTE — Patient Instructions (Addendum)

## 2011-09-13 NOTE — Telephone Encounter (Signed)
Patient decided to come in this afternoon.

## 2011-09-13 NOTE — Telephone Encounter (Signed)
Patient does not need a hard script for the cough medicine but would like it called in to the [facility] in [facility]

## 2011-09-13 NOTE — Telephone Encounter (Signed)
Patient needs her cough medicine, can she pick up a hard script?

## 2011-09-13 NOTE — Telephone Encounter (Signed)
Dr Abner Greenspan printed RX. RX faxed and pt informed

## 2011-09-13 NOTE — Telephone Encounter (Signed)
Please advise 

## 2011-09-14 NOTE — Assessment & Plan Note (Signed)
Her young adult daughter has just tried to commit suicide and has come out as homosexual, she is struggling with her family's response to this and her daughter's depression. Will increase her Effexor XR to 150 mg daily and reassess at next visit

## 2011-09-14 NOTE — Assessment & Plan Note (Addendum)
Recurrent, she improved with last treatment only to worsen when antibiotic stopped. Restart antibiotics and mucinex, given refill on Tussionex and increase rest. Once again reminded of how important it is to quit smoking altogether. Started on Medrol dosepak

## 2011-09-14 NOTE — Progress Notes (Signed)
Patient ID: Rebecca Boyle, female   DOB: 10/19/1970, 41 y.o.   MRN: [id number] Rebecca Boyle [id number] [birth year] 09/14/2011      Progress Note-Follow Up  Subjective  Chief Complaint  Chief Complaint  Patient presents with  . Cough    X 2 days  . Fever    X 2 days- last night around 100    HPI  Patient is a 41 year old Caucasian female who is in today for worsening cough. She's been struggling with recurrent sinusitis and bronchitis for many months. She was recently treated with antibiotics and improved while on antibiotics but her symptoms have worsened off of them. She is now struggling with head and chest congestion. She's fevers, chills, malaise, myalgias which have been worsening for 2 days. No cocaine but some anorexia is noted. Sputum is yellow in nature. She brings it up with coughing as well as through her nose. She has intermittent headaches most of her cough is dry but does keep her up at night. She's also very tearful and cries a lot during the visit secondary to her daughter's recent suicide. That was recently commenced and her family and community his responses but very negative  Past Medical History  Diagnosis Date  . Arthritis     hands, hips, knees, back feet  . Depression   . Lupus 2004  . PONV (postoperative nausea and vomiting)   . Syncope, cardiogenic 2002    treated with toprol  . Anxiety   . Heart murmur   . Chicken pox as a child  . Hypertension   . Tachycardia 06/29/2011  . Anxiety and depression 01/04/2011  . Headache disorder 05/16/2011  . Pain, dental 07/19/2011  . Pedal edema 08/14/2011  . Sinusitis acute 08/29/2011    Past Surgical History  Procedure Date  . Abdominal hysterectomy 2010    had uterus left ovary removed 2010, right ovary removed 8/12  . Cholecystectomy 2005  . Breast surgery 1995    lumpectomy of left breast- benign  . Right wrist nerve repair - 2008 2008    fell on nail, repair    Family History  Problem Relation  Age of Onset  . Hyperlipidemia Mother   . Hypertension Mother   . Stroke Mother     X 5 mini  . Cancer Mother 70    cervical  . Heart disease Mother   . Hypertension Father   . Heart disease Father     cad with stent  . Arthritis Father     2 blown discs  . Stroke Maternal Grandmother   . Lupus Maternal Grandmother   . Heart disease Paternal Grandfather     History   Social History  . Marital Status: Legally Separated    Spouse Name: N/A    Number of Children: N/A  . Years of Education: college   Occupational History  . Not on file.   Social History Main Topics  . Smoking status: Current Some Day Smoker -- 0.2 packs/day for 10 years    Types: Cigarettes  . Smokeless tobacco: Never Used  . Alcohol Use: No  . Drug Use: No  . Sexually Active: No   Other Topics Concern  . 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 Smokes 2 cigarettes a dayDenies ETOH    Current Outpatient Prescriptions on File  Prior to Visit  Medication Sig Dispense Refill  . amLODipine (NORVASC) 10 MG tablet Take 1 tablet (10 mg total) by mouth daily.  30 tablet  5  . chlorpheniramine-HYDROcodone (TUSSIONEX PENNKINETIC ER) 10-8 MG/5ML LQCR Take 5 mLs by mouth every 12 (twelve) hours as needed.  140 mL  1  . esomeprazole (NEXIUM) 40 MG capsule Take 1 capsule (40 mg total) by mouth daily before breakfast.  30 capsule  2  . Estradiol (ESTROGEL TD) Place 1 application onto the skin every evening.       . furosemide (LASIX) 20 MG tablet 2 tabs po daily x 4 days then 1-2 tabs po daily as needed  30 tablet  1  . guaiFENesin (MUCINEX) 600 MG 12 hr tablet Take 2 tablets (1,200 mg total) by mouth 2 (two) times daily.  28 tablet  0  . hydroxychloroquine (PLAQUENIL) 200 MG tablet Take 200 mg by mouth 2 (two) times daily.        Marland Kitchen ibuprofen (ADVIL,MOTRIN) 200 MG tablet  Take 400 mg by mouth every 6 (six) hours as needed. For pain.      Marland Kitchen LORazepam (ATIVAN) 1 MG tablet Take 1 tablet (1 mg total) by mouth every 6 (six) hours as needed. For anxiety  90 tablet  1  . metoprolol succinate (TOPROL-XL) 100 MG 24 hr tablet Take 1 tablet (100 mg total) by mouth daily.  30 tablet  2  . Multiple Vitamin (MULITIVITAMIN WITH MINERALS) TABS Take 1 tablet by mouth daily.      Marland Kitchen oxyCODONE (OXY IR/ROXICODONE) 5 MG immediate release tablet Take 1 tablet (5 mg total) by mouth every 3 (three) hours as needed for pain. Max of 8 tabs in 24 hours  240 tablet  0  . Potassium 99 MG TABS Take 1 tablet by mouth daily.      . sodium chloride (AYR) 0.65 % nasal spray Place 1 spray into the nose as needed for congestion.  30 mL  12  . SUMAtriptan (IMITREX) 50 MG tablet ONE TABLET AT START OF MIGRAINE. MAY REPEAT IN 2 HOURS AS NEEDED. MAX 2 TABS/DAY  10 tablet  1  . vitamin C (ASCORBIC ACID) 500 MG tablet Take 500 mg by mouth daily.      Marland Kitchen venlafaxine XR (EFFEXOR-XR) 150 MG 24 hr capsule Take 1 capsule (150 mg total) by mouth daily.  30 capsule  3    Allergies  Allergen Reactions  . Morphine And Related Shortness Of Breath  . Codeine Nausea And Vomiting    Review of Systems  Review of Systems  Constitutional: Positive for fever, chills and malaise/fatigue.  HENT: Positive for congestion.   Eyes: Negative for discharge.  Respiratory: Positive for cough, sputum production and wheezing. Negative for shortness of breath.   Cardiovascular: Negative for chest pain, palpitations and leg swelling.  Gastrointestinal: Negative for nausea, abdominal pain and diarrhea.  Genitourinary: Negative for dysuria.  Musculoskeletal: Negative for falls.  Skin: Negative for rash.  Neurological: Positive for headaches. Negative for loss of consciousness.  Endo/Heme/Allergies: Negative for polydipsia.  Psychiatric/Behavioral: Positive for depression. Negative for suicidal ideas. The patient is  nervous/anxious and has insomnia.     Objective  BP 142/89  Pulse 88  Temp 97.6 F (36.4 C) (Temporal)  Ht 5\' 5"  (1.651 m)  Wt 163 lb 1.9 oz (73.991 kg)  BMI 27.14 kg/m2  SpO2 99%  Physical Exam  Physical Exam  Constitutional: She is oriented to person, place, and time and  well-developed, well-nourished, and in no distress. No distress.  HENT:  Head: Normocephalic and atraumatic.       Nasal mucosa boggy and erythematous  Eyes: Conjunctivae are normal.  Neck: Neck supple. No thyromegaly present.  Cardiovascular: Normal rate, regular rhythm and normal heart sounds.   No murmur heard. Pulmonary/Chest: Effort normal. She has wheezes.  Abdominal: Soft. Bowel sounds are normal. She exhibits no distension and no mass.  Musculoskeletal: She exhibits no edema.  Lymphadenopathy:    She has no cervical adenopathy.  Neurological: She is alert and oriented to person, place, and time.  Skin: Skin is warm and dry. No rash noted. She is not diaphoretic.  Psychiatric: Memory, affect and judgment normal.    Lab Results  Component Value Date   TSH 0.44 06/29/2011   Lab Results  Component Value Date   WBC 7.1 06/29/2011   HGB 14.0 06/29/2011   HCT 42.5 06/29/2011   MCV 94.2 06/29/2011   PLT 207.0 06/29/2011   Lab Results  Component Value Date   CREATININE 0.6 06/29/2011   BUN 14 06/29/2011   NA 141 06/29/2011   K 3.7 06/29/2011   CL 108 06/29/2011   CO2 24 06/29/2011   Lab Results  Component Value Date   ALT 56* 06/29/2011   AST 35 06/29/2011   ALKPHOS 82 06/29/2011   BILITOT 0.3 06/29/2011   Lab Results  Component Value Date   CHOL 205* 06/29/2011   Lab Results  Component Value Date   HDL 47.30 06/29/2011   No results found for this basename: LDLCALC   Lab Results  Component Value Date   TRIG 111.0 06/29/2011   Lab Results  Component Value Date   CHOLHDL 4 06/29/2011     Assessment & Plan  Bronchitis Recurrent, she improved with last treatment only to worsen when  antibiotic stopped. Restart antibiotics and mucinex, given refill on Tussionex and increase rest. Once again reminded of how important it is to quit smoking altogether. Started on Medrol dosepak  HTN (hypertension) Adequately controlled considering acute illness  Anxiety and depression Her young adult daughter has just tried to commit suicide and has come out as homosexual, she is struggling with her family's response to this and her daughter's depression. Will increase her Effexor XR to 150 mg daily and reassess at next visit

## 2011-09-14 NOTE — Assessment & Plan Note (Addendum)
Adequately controlled considering acute illness

## 2011-09-18 ENCOUNTER — Telehealth: Payer: Self-pay

## 2011-09-18 NOTE — Telephone Encounter (Signed)
Pt left a message stating that she had emergency teeth pulled on Sunday. Pt stated that she was given another pain medication and wanted to make sure that it was ok to take this?  -I called Dr Bernell List office because the call we got this morning was asking for pain medication?  -I spoke with Dr Venia Minks (dentist) and he stated the patient called him yesterday and stated she had broke a tooth and the nerve was "hanging out" and pt was crying due to being in pain. Dr Venia Minks stated that he told her that he would meet the patient at his office. Dr Venia Minks stated that once he got to the office he did not see any broken tooth, although the patient had decaying teeth, or any nerve hanging out. Dr Venia Minks stated that after feeling around the teeth she stated that her back tooth was the one hurting. Dr Venia Minks stated that he told the pt that it didn't look broke but he could pull it. Patient agreed. Then after the procedure pt requested that she have Oxycodone 10 mg for the pain. Dr Venia Minks stated that he asked her what all medication she took and pt told Dr Venia Minks just something for her Lupus (not giving a name of the medication). Dr Venia Minks stated that he does not write RX's for Oxy 10 mg that he would give her a few Norco 5-325 and that this has a little tylenol in it so take 1 daily. Pt then called Dr Bernell List office this morning stating that she has had allergic reaction to Tylenol (we don't have this on our allergy list) and that she wants an RX for Oxy 10 mg. Dr Venia Minks continued to have his staff inform pt he does not write RX's for Oxy 10mg .    I left a message for patient to return my call.

## 2011-09-18 NOTE — Telephone Encounter (Signed)
Patient returned my call while I was at lunch.  I called patient back and she states she thinks she got everything "figured out"? Pt states she is going to have to have all her teeth pulled and the dentist told her that she is going to be in a lot of pain? Pt never asked me if she can take the other medication but I asked if she was taking it and she said no.  We also got a [facility] from [facility] for [facility]epam. I called and spoke with Tresa Endo and she states pt always gets quantity 90 on 06-29-11, 07-22-11, 08-14-11, and 08-31-11. Per Dr Milinda Cave he will not refill this and pt needs to come in and see MD. Patient informed and stated that she did not request this to be filled that [facility] did it on there [facility], but for [place] to let her know as soon as it was filled.

## 2011-09-18 NOTE — Telephone Encounter (Signed)
FYI: Dr Anson [place] (Dental) office called stating pt is calling them stating she had some dental work done and is needing some more Oxycodone? They called [place] asking what patient takes and when the Oxy was last refilled. They informed me that they would not be giving patient anymore Oxy but they would have pt take Tylenol. Pt informed them she was allergic to tylenol. I informed Dr Anson [place] office that we don't have Tylenol listed on her allergy list?

## 2011-09-19 NOTE — Telephone Encounter (Signed)
I did talk to her and she states she thinks everything got figured out. Pt did call and ask Diane if we had talked to her dentist. I will have dentist fax [place] his notes.

## 2011-09-19 NOTE — Telephone Encounter (Signed)
So let me know if you talk to her and have her dentist forward their notes to [place] about their plans and procedure notes so we can monitor

## 2011-09-25 ENCOUNTER — Telehealth: Payer: Self-pay

## 2011-09-25 DIAGNOSIS — F329 Major depressive disorder, single episode, unspecified: Secondary | ICD-10-CM

## 2011-09-25 MED ORDER — LORAZEPAM 1 MG PO TABS: 1.0000 mg | ORAL_TABLET | Freq: Four times a day (QID) | ORAL | Status: DC | PRN

## 2011-09-25 NOTE — Telephone Encounter (Signed)
Patient left a message asking that her Lorazepam be refilled? Pt stated that she will be out tomorrow? Please advise refill? Last RX wrote on 08-14-11 quantity 90 with 1 refill

## 2011-09-25 NOTE — Telephone Encounter (Signed)
Per MD pt can have 1 more 90 day supply and no refills. Pt needs to come in the morning to pick up RX, sign drug contract, and take a drug screen. Pt was informed of the refill and to come pick it up in the morning. Pt will then be notified of the drug contract and drug screen.  Pt was informed that the 90 day supply is supposed to last 30 days. Pt states she understood and has been taking 2 at night due to her daughter trying to kill herself.

## 2011-09-25 NOTE — Telephone Encounter (Signed)
Patient called back and informed Rebecca Boyle that she will not have enough Lorazepam to last her until tomorrow because she just took her last one.

## 2011-09-25 NOTE — Telephone Encounter (Signed)
Pt states she takes 2 at night.

## 2011-09-26 ENCOUNTER — Other Ambulatory Visit (INDEPENDENT_AMBULATORY_CARE_PROVIDER_SITE_OTHER): Payer: BC Managed Care – PPO

## 2011-09-26 DIAGNOSIS — R52 Pain, unspecified: Secondary | ICD-10-CM

## 2011-09-26 DIAGNOSIS — Z79899 Other long term (current) drug therapy: Secondary | ICD-10-CM

## 2011-09-27 LAB — DRUGS OF ABUSE SCREEN W/O ALC, ROUTINE URINE
Barbiturate Quant, Ur: NEGATIVE
Cocaine Metabolites: NEGATIVE
Creatinine,U: 244.7 mg/dL
Opiate Screen, Urine: POSITIVE — AB
Propoxyphene: NEGATIVE

## 2011-09-28 LAB — OPIATES/OPIOIDS (LC/MS-MS)
Codeine Urine: NEGATIVE ng/mL
Heroin (6-AM), UR: NEGATIVE ng/mL
Hydrocodone: NEGATIVE ng/mL
Hydromorphone: 195 ng/mL
Morphine Urine: >10000 ng/mL
Oxycodone, ur: 1397 ng/mL
Oxymorphone: 5982 ng/mL

## 2011-10-03 ENCOUNTER — Other Ambulatory Visit: Payer: Self-pay | Admitting: Family Medicine

## 2011-10-03 DIAGNOSIS — R52 Pain, unspecified: Secondary | ICD-10-CM

## 2011-10-03 NOTE — Telephone Encounter (Signed)
Patient does not need the med today, she will need it before the end of the week. Please call her when she can pick up her hard script.

## 2011-10-03 NOTE — Telephone Encounter (Signed)
Can refill on Friday. Same sig and same # of same pain med

## 2011-10-03 NOTE — Telephone Encounter (Signed)
Please advise Oxy refill? Last RX wrote on 09-07-11 quantity 240 with 0 refill

## 2011-10-04 MED ORDER — OXYCODONE HCL 5 MG PO TABS: 5.0000 mg | ORAL_TABLET | ORAL | Status: DC | PRN

## 2011-10-04 NOTE — Telephone Encounter (Signed)
RX printed and detailed message left, that she can pick up the RX at the front desk on Friday 10-06-11

## 2011-10-16 ENCOUNTER — Encounter: Payer: Self-pay | Admitting: Family Medicine

## 2011-10-16 ENCOUNTER — Ambulatory Visit (INDEPENDENT_AMBULATORY_CARE_PROVIDER_SITE_OTHER): Payer: BC Managed Care – PPO | Admitting: Family Medicine

## 2011-10-16 VITALS — BP 135/84 | HR 82 | Temp 98.1°F | Ht 65.5 in | Wt 164.4 lb

## 2011-10-16 DIAGNOSIS — F419 Anxiety disorder, unspecified: Secondary | ICD-10-CM

## 2011-10-16 DIAGNOSIS — F329 Major depressive disorder, single episode, unspecified: Secondary | ICD-10-CM

## 2011-10-16 DIAGNOSIS — F341 Dysthymic disorder: Secondary | ICD-10-CM

## 2011-10-16 DIAGNOSIS — J329 Chronic sinusitis, unspecified: Secondary | ICD-10-CM

## 2011-10-16 DIAGNOSIS — G8929 Other chronic pain: Secondary | ICD-10-CM

## 2011-10-16 DIAGNOSIS — J40 Bronchitis, not specified as acute or chronic: Secondary | ICD-10-CM

## 2011-10-16 MED ORDER — DIAZEPAM 10 MG PO TABS: 10.0000 mg | ORAL_TABLET | Freq: Two times a day (BID) | ORAL | Status: DC | PRN

## 2011-10-16 MED ORDER — HYDROCOD POLST-CHLORPHEN POLST 10-8 MG/5ML PO LQCR: 5.0000 mL | Freq: Two times a day (BID) | ORAL | Status: DC | PRN

## 2011-10-16 NOTE — Patient Instructions (Addendum)

## 2011-10-16 NOTE — Assessment & Plan Note (Signed)
Improved energy level with increased Venlafaxine, Lorazepam has stopped working. Will try switch

## 2011-10-17 ENCOUNTER — Ambulatory Visit: Payer: BC Managed Care – PPO | Admitting: Family Medicine

## 2011-10-22 NOTE — Progress Notes (Signed)
Patient ID: Rebecca Boyle, female   DOB: 1970/07/12, 41 y.o.   MRN: [id number] CAMANI SESAY [id number] August 22, 1970 10/22/2011      Progress Note-Follow Up  Subjective  Chief Complaint  Chief Complaint  Patient presents with  . Follow-up    1 month    HPI  Patient is a 41 year old Caucasian female who is in today for followup. Her bronchitis and congestion is greatly improved. She continues to struggle with some dental pain but finds it tolerable is going to have significant dental work in the next 2 months. No new or acute complaints. Her anxiety and depression are improving as her daughter improves. No chest pain, palpitations, shortness of breath, fevers, chills, GI or GU complaints noted.  Past Medical History  Diagnosis Date  . Arthritis     hands, hips, knees, back feet  . Depression   . Lupus 2004  . PONV (postoperative nausea and vomiting)   . Syncope, cardiogenic 2002    treated with toprol  . Anxiety   . Heart murmur   . Chicken pox as a child  . Hypertension   . Tachycardia 06/29/2011  . Anxiety and depression 01/04/2011  . Headache disorder 05/16/2011  . Pain, dental 07/19/2011  . Pedal edema 08/14/2011  . Sinusitis acute 08/29/2011    Past Surgical History  Procedure Date  . Abdominal hysterectomy 2010    had uterus left ovary removed 2010, right ovary removed 8/12  . Cholecystectomy 2005  . Breast surgery 1995    lumpectomy of left breast- benign  . Right wrist nerve repair - 2008 2008    fell on nail, repair    Family History  Problem Relation Age of Onset  . Hyperlipidemia Mother   . Hypertension Mother   . Stroke Mother     X 5 mini  . Cancer Mother 64    cervical  . Heart disease Mother   . Hypertension Father   . Heart disease Father     cad with stent  . Arthritis Father     2 blown discs  . Stroke Maternal Grandmother   . Lupus Maternal Grandmother   . Heart disease Paternal Grandfather     History   Social History  .  Marital Status: Legally Separated    Spouse Name: N/A    Number of Children: N/A  . Years of Education: college   Occupational History  . Not on file.   Social History Main Topics  . Smoking status: Current Some Day Smoker -- 0.2 packs/day for 10 years    Types: Cigarettes  . Smokeless tobacco: Never Used  . Alcohol Use: No  . Drug Use: No  . Sexually Active: No   Other Topics Concern  . 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 Smokes 2 cigarettes a dayDenies ETOH    Current Outpatient Prescriptions on File Prior to Visit  Medication Sig Dispense Refill  . albuterol (PROVENTIL HFA;VENTOLIN HFA) 108 (90 BASE) MCG/ACT inhaler Inhale 2 puffs into the lungs every 4 (four) hours as needed for wheezing. For exercise induced asthma  1 Inhaler  5  . amLODipine (NORVASC) 10 MG tablet Take 1 tablet (10 mg total) by mouth daily.  30 tablet  5  . esomeprazole (NEXIUM) 40 MG capsule Take 1 capsule (40 mg total) by mouth  daily before breakfast.  30 capsule  2  . Estradiol (ESTROGEL TD) Place 1 application onto the skin every evening.       . furosemide (LASIX) 20 MG tablet 2 tabs po daily x 4 days then 1-2 tabs po daily as needed  30 tablet  1  . hydroxychloroquine (PLAQUENIL) 200 MG tablet Take 200 mg by mouth 2 (two) times daily.        Marland Kitchen ibuprofen (ADVIL,MOTRIN) 200 MG tablet Take 400 mg by mouth every 6 (six) hours as needed. For pain.      Marland Kitchen LORazepam (ATIVAN) 1 MG tablet Take 1 tablet (1 mg total) by mouth every 6 (six) hours as needed. For anxiety  90 tablet  0  . metoprolol succinate (TOPROL-XL) 100 MG 24 hr tablet Take 1 tablet (100 mg total) by mouth daily.  30 tablet  2  . Multiple Vitamin (MULITIVITAMIN WITH MINERALS) TABS Take 1 tablet by mouth daily.      Marland Kitchen oxyCODONE (OXY IR/ROXICODONE) 5 MG immediate release tablet Take 1 tablet (5 mg  total) by mouth every 3 (three) hours as needed for pain. Max of 8 tabs in 24 hours  240 tablet  0  . Potassium 99 MG TABS Take 1 tablet by mouth daily.      . SUMAtriptan (IMITREX) 50 MG tablet ONE TABLET AT START OF MIGRAINE. MAY REPEAT IN 2 HOURS AS NEEDED. MAX 2 TABS/DAY  10 tablet  1  . venlafaxine XR (EFFEXOR-XR) 150 MG 24 hr capsule Take 1 capsule (150 mg total) by mouth daily.  30 capsule  3  . vitamin C (ASCORBIC ACID) 500 MG tablet Take 500 mg by mouth daily.      Marland Kitchen guaiFENesin (MUCINEX) 600 MG 12 hr tablet Take 2 tablets (1,200 mg total) by mouth 2 (two) times daily.  28 tablet  0  . sodium chloride (AYR) 0.65 % nasal spray Place 1 spray into the nose as needed for congestion.  30 mL  12    Allergies  Allergen Reactions  . Morphine And Related Shortness Of Breath  . Codeine Nausea And Vomiting    Review of Systems  Review of Systems  Constitutional: Positive for malaise/fatigue. Negative for fever.  HENT: Negative for congestion.   Eyes: Negative for discharge.  Respiratory: Negative for shortness of breath.   Cardiovascular: Negative for chest pain, palpitations and leg swelling.  Gastrointestinal: Negative for nausea, abdominal pain and diarrhea.  Genitourinary: Negative for dysuria.  Musculoskeletal: Negative for falls.  Skin: Negative for rash.  Neurological: Negative for loss of consciousness and headaches.  Endo/Heme/Allergies: Negative for polydipsia.  Psychiatric/Behavioral: Negative for depression and suicidal ideas. The patient is nervous/anxious. The patient does not have insomnia.     Objective  BP 135/84  Pulse 82  Temp 98.1 F (36.7 C) (Temporal)  Ht 5' 5.5" (1.664 m)  Wt 164 lb 6.4 oz (74.571 kg)  BMI 26.94 kg/m2  SpO2 99%  Physical Exam  Physical Exam  Constitutional: She is oriented to person, place, and time and well-developed, well-nourished, and in no distress. No distress.  HENT:  Head: Normocephalic and atraumatic.  Right Ear: External  ear normal.  Left Ear: External ear normal.  Nose: Nose normal.  Mouth/Throat: Oropharynx is clear and moist. No oropharyngeal exudate.  Eyes: Conjunctivae normal are normal. Pupils are equal, round, and reactive to light. Right eye exhibits no discharge. Left eye exhibits no discharge. No scleral icterus.  Neck: Normal range of motion. Neck  supple. No thyromegaly present.  Cardiovascular: Normal rate, regular rhythm, normal heart sounds and intact distal pulses.   No murmur heard. Pulmonary/Chest: Effort normal and breath sounds normal. No respiratory distress. She has no wheezes. She has no rales.  Abdominal: Soft. Bowel sounds are normal. She exhibits no distension and no mass. There is no tenderness.  Musculoskeletal: Normal range of motion. She exhibits no edema and no tenderness.  Lymphadenopathy:    She has no cervical adenopathy.  Neurological: She is alert and oriented to person, place, and time. She has normal reflexes. No cranial nerve deficit. Coordination normal.  Skin: Skin is warm and dry. No rash noted. She is not diaphoretic.  Psychiatric: Mood, memory and affect normal.    Lab Results  Component Value Date   TSH 0.44 06/29/2011   Lab Results  Component Value Date   WBC 7.1 06/29/2011   HGB 14.0 06/29/2011   HCT 42.5 06/29/2011   MCV 94.2 06/29/2011   PLT 207.0 06/29/2011   Lab Results  Component Value Date   CREATININE 0.6 06/29/2011   BUN 14 06/29/2011   NA 141 06/29/2011   K 3.7 06/29/2011   CL 108 06/29/2011   CO2 24 06/29/2011   Lab Results  Component Value Date   ALT 56* 06/29/2011   AST 35 06/29/2011   ALKPHOS 82 06/29/2011   BILITOT 0.3 06/29/2011   Lab Results  Component Value Date   CHOL 205* 06/29/2011   Lab Results  Component Value Date   HDL 47.30 06/29/2011   No results found for this basename: [facility]   Lab Results  Component Value Date   TRIG 111.0 06/29/2011   Lab Results  Component Value Date   CHOLHDL 4 06/29/2011     Assessment &  Plan  Anxiety and depression Improved energy level with increased Venlafaxine, Lorazepam has stopped working. Will try switch  Chronic pain Has signed a pain contract and agrees to comply with it's terms. Given refill today, use meds prn  Bronchitis Improved at this time

## 2011-10-22 NOTE — Assessment & Plan Note (Signed)
Improved at this time. 

## 2011-10-22 NOTE — Assessment & Plan Note (Signed)
Has signed a pain contract and agrees to comply with it's terms. Given refill today, use meds prn

## 2011-10-23 ENCOUNTER — Telehealth: Payer: Self-pay | Admitting: Family Medicine

## 2011-10-23 NOTE — Telephone Encounter (Signed)
Unfortunately she does not have a payable diagnosis that insurance will agree to cover them for, many doctor's do not even write prescriptions for them for their diabetics because there is not evidence that they help. She will have to pay for them if she wants them. Sorry

## 2011-10-23 NOTE — Telephone Encounter (Signed)
Please advise 

## 2011-10-23 NOTE — Telephone Encounter (Signed)
Patient informed and voiced understanding

## 2011-10-23 NOTE — Telephone Encounter (Signed)
Patient is requesting Rx for orthopedic/diabetic shoes to go to [facility]. She feels they would help with her knees and back pain.

## 2011-10-31 ENCOUNTER — Other Ambulatory Visit: Payer: Self-pay

## 2011-10-31 MED ORDER — METOPROLOL SUCCINATE ER 100 MG PO TB24: 100.0000 mg | ORAL_TABLET | Freq: Every day | ORAL | Status: DC

## 2011-11-01 ENCOUNTER — Telehealth: Payer: Self-pay | Admitting: Family Medicine

## 2011-11-01 DIAGNOSIS — R52 Pain, unspecified: Secondary | ICD-10-CM

## 2011-11-01 MED ORDER — OXYCODONE HCL 5 MG PO TABS: 5.0000 mg | ORAL_TABLET | ORAL | Status: DC | PRN

## 2011-11-01 NOTE — Telephone Encounter (Signed)
Patient informed that RX can be picked up

## 2011-11-01 NOTE — Telephone Encounter (Signed)
Patient isn't out yet but she will need her Rx for oxycodone. Please contact patient when ready to pu.

## 2011-11-01 NOTE — Telephone Encounter (Signed)
OK to refill Oxycodone 5 mg tabs 1 tab po q 4hours prn pain, disp: #240

## 2011-11-01 NOTE — Telephone Encounter (Signed)
Please advise Oxy refill? Last RX was wrote on 10-06-11 quantity 240 with 0 refills

## 2011-11-10 ENCOUNTER — Encounter: Payer: Self-pay | Admitting: Family Medicine

## 2011-11-10 ENCOUNTER — Ambulatory Visit (INDEPENDENT_AMBULATORY_CARE_PROVIDER_SITE_OTHER): Payer: BC Managed Care – PPO | Admitting: Family Medicine

## 2011-11-10 VITALS — BP 122/83 | HR 92 | Temp 98.4°F | Ht 65.5 in | Wt 171.1 lb

## 2011-11-10 DIAGNOSIS — M25561 Pain in right knee: Secondary | ICD-10-CM

## 2011-11-10 DIAGNOSIS — M25569 Pain in unspecified knee: Secondary | ICD-10-CM

## 2011-11-10 DIAGNOSIS — R000 Tachycardia, unspecified: Secondary | ICD-10-CM

## 2011-11-10 MED ORDER — OXYCODONE HCL 10 MG PO TABS: 10.0000 mg | ORAL_TABLET | Freq: Three times a day (TID) | ORAL | Status: DC | PRN

## 2011-11-10 NOTE — Patient Instructions (Addendum)
Chondromalacia  Your exam shows your knee pain is likely due to a cartilage swelling and irritation under the knee cap called chondromalacia. The knee cap moves up and down in its groove when you walk, run, or squat. It can become irritated from sports or work activities if the knee cap is not lined up perfectly or your quadriceps muscle is relatively weak. This can cause pain, usually around the knee cap but sometimes the back of the knee. It is most common in young and active people. Climbing stairs, prolonged sitting and rising from a chair will often make the pain worse.  Treatment includes rest from activities which make it worse. The pain can be reduced with ice packs and anti-inflammatory pain medicine. Exercises to strengthen the thigh (quadriceps) muscle may help prevent further episodes of this condition. Shoe inserts to correct imbalances in the legs or feet may be prescribed by your doctor or a specialist. Support for the knee cap with a light brace may also be helpful. Call your caregiver if you are not improving after 2 - 3 weeks of treatment.   SEEK MEDICAL CARE IF:   You have increasing pain or your knee becomes hot, swollen, red, or begins to give out or lock up on you.  Document Released: 02/24/2004 Document Revised: 04/10/2011 Document Reviewed: 07/14/2008  [employer] Patient Information 2013 [employer].

## 2011-11-12 ENCOUNTER — Encounter: Payer: Self-pay | Admitting: Family Medicine

## 2011-11-12 DIAGNOSIS — M25561 Pain in right knee: Secondary | ICD-10-CM | POA: Insufficient documentation

## 2011-11-12 NOTE — Assessment & Plan Note (Signed)
Recently had an MRI with her orthopaedic group [facility] and agrees to let us get a copy of her MRI and her notes there. She is noted to have degenerative changes and effusions, they have discussed the possibility of surgeries but for now she will proceed with PT and we will increase her pain meds for now and reassess in 3 weeks. She has signed and agreed to a pain contract.

## 2011-11-12 NOTE — Assessment & Plan Note (Signed)
Mild today improved on recheck will continue to monitor

## 2011-11-12 NOTE — Progress Notes (Signed)
Patient ID: Rebecca Boyle, female   DOB: 11-18-1970, 41 y.o.   MRN: [id number] Rebecca Boyle [id number] [birth year] 11/12/2011      Progress Note-Follow Up  Subjective  Chief Complaint  Chief Complaint  Patient presents with  . Knee Pain    right X 4 weeks- had MRI    HPI  Patient is a 41 yo Caucasian female in today for evaluation of ongoing pain. She had sudden onset of right knee pain about 4 weeks ago but have any acute fall or injury. The pain is largely suprapatellar and subpatellar. 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 After having an MRI done to discuss surgery and physical therapy and for now we will get copies of records and she is asking [place] to help in management. No radicular symptoms below the knee or swelling. No other acute illness, chest pain, palpitations, shortness of breath, GI or GU complaints noted today.  Past Medical History  Diagnosis Date  . Arthritis     hands, hips, knees, back feet  . Depression   . Lupus 2004  . PONV (postoperative nausea and vomiting)   . Syncope, cardiogenic 2002    treated with toprol  . Anxiety   . Heart murmur   . Chicken pox as a child  . Hypertension   . Tachycardia 06/29/2011  . Anxiety and depression 01/04/2011  . Headache disorder 05/16/2011  . Pain, dental 07/19/2011  . Pedal edema 08/14/2011  . Sinusitis acute 08/29/2011  . Knee pain, right 11/12/2011    Past Surgical History  Procedure Date  . Abdominal hysterectomy 2010    had uterus left ovary removed 2010, right ovary removed 8/12  . Cholecystectomy 2005  . Breast surgery 1995    lumpectomy of left breast- benign  . Right wrist nerve repair - 2008 2008    fell on nail, repair    Family History  Problem Relation Age of Onset  . Hyperlipidemia Mother   . Hypertension Mother   . Stroke Mother     X 5 mini  . Cancer Mother 18    cervical  . Heart disease Mother   . Hypertension Father    . Heart disease Father     cad with stent  . Arthritis Father     2 blown discs  . Stroke Maternal Grandmother   . Lupus Maternal Grandmother   . Heart disease Paternal Grandfather     History   Social History  . Marital Status: Legally Separated    Spouse Name: N/A    Number of Children: N/A  . Years of Education: college   Occupational History  . Not on file.   Social History Main Topics  . Smoking status: Current Some Day Smoker -- 0.2 packs/day for 10 years    Types: Cigarettes  . Smokeless tobacco: Never Used  . Alcohol Use: No  . Drug Use: No  . Sexually Active: No   Other Topics Concern  . 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 Smokes 2 cigarettes a dayDenies ETOH    Current Outpatient Prescriptions on File Prior to Visit  Medication Sig Dispense Refill  . albuterol (PROVENTIL HFA;VENTOLIN HFA) 108 (90 BASE) MCG/ACT inhaler Inhale 2 puffs into the lungs every 4 (four) hours as  needed for wheezing. For exercise induced asthma  1 Inhaler  5  . amLODipine (NORVASC) 10 MG tablet Take 1 tablet (10 mg total) by mouth daily.  30 tablet  5  . chlorpheniramine-HYDROcodone (TUSSIONEX PENNKINETIC ER) 10-8 MG/5ML LQCR Take 5 mLs by mouth every 12 (twelve) hours as needed.  140 mL  1  . diazepam (VALIUM) 10 MG tablet Take 1 tablet (10 mg total) by mouth every 12 (twelve) hours as needed for anxiety or sleep (may try 1/2 to 1 tab at a time as directed).  60 tablet  0  . esomeprazole (NEXIUM) 40 MG capsule Take 1 capsule (40 mg total) by mouth daily before breakfast.  30 capsule  2  . Estradiol (ESTROGEL TD) Place 1 application onto the skin every evening.       . furosemide (LASIX) 20 MG tablet 2 tabs po daily x 4 days then 1-2 tabs po daily as needed  30 tablet  1  . hydroxychloroquine (PLAQUENIL) 200 MG tablet Take 200 mg by mouth  2 (two) times daily.        . metoprolol succinate (TOPROL-XL) 100 MG 24 hr tablet Take 1 tablet (100 mg total) by mouth daily.  30 tablet  4  . Multiple Vitamin (MULITIVITAMIN WITH MINERALS) TABS Take 1 tablet by mouth daily.      . Potassium 99 MG TABS Take 1 tablet by mouth daily.      . sodium chloride (AYR) 0.65 % nasal spray Place 1 spray into the nose as needed for congestion.  30 mL  12  . SUMAtriptan (IMITREX) 50 MG tablet ONE TABLET AT START OF MIGRAINE. MAY REPEAT IN 2 HOURS AS NEEDED. MAX 2 TABS/DAY  10 tablet  1  . venlafaxine XR (EFFEXOR-XR) 150 MG 24 hr capsule Take 1 capsule (150 mg total) by mouth daily.  30 capsule  3  . vitamin C (ASCORBIC ACID) 500 MG tablet Take 500 mg by mouth daily.      Marland Kitchen guaiFENesin (MUCINEX) 600 MG 12 hr tablet Take 2 tablets (1,200 mg total) by mouth 2 (two) times daily.  28 tablet  0  . ibuprofen (ADVIL,MOTRIN) 200 MG tablet Take 400 mg by mouth every 6 (six) hours as needed. For pain.        Allergies  Allergen Reactions  . Morphine And Related Shortness Of Breath  . Codeine Nausea And Vomiting    Review of Systems  Review of Systems  Constitutional: Negative for fever and malaise/fatigue.  HENT: Negative for congestion.   Eyes: Negative for discharge.  Respiratory: Negative for shortness of breath.   Cardiovascular: Negative for chest pain, palpitations and leg swelling.  Gastrointestinal: Negative for nausea, abdominal pain and diarrhea.  Genitourinary: Negative for dysuria.  Musculoskeletal: Positive for joint pain. Negative for falls.  Skin: Negative for rash.  Neurological: Negative for loss of consciousness and headaches.  Endo/Heme/Allergies: Negative for polydipsia.  Psychiatric/Behavioral: Negative for depression and suicidal ideas. The patient is not nervous/anxious and does not have insomnia.     Objective  BP 122/83  Pulse 92  Temp 98.4 F (36.9 C) (Temporal)  Ht 5' 5.5" (1.664 m)  Wt 171 lb 1.9 oz (77.62 kg)  BMI  28.04 kg/m2  SpO2 98%  Physical Exam  Physical Exam  Constitutional: She is oriented to person, place, and time and well-developed, well-nourished, and in no distress. No distress.  HENT:  Head: Normocephalic and atraumatic.  Eyes: Conjunctivae normal are normal.  Neck: Neck  supple. No thyromegaly present.  Cardiovascular: Normal rate, regular rhythm and normal heart sounds.   No murmur heard. Pulmonary/Chest: Effort normal and breath sounds normal. She has no wheezes.  Abdominal: She exhibits no distension and no mass.  Musculoskeletal: She exhibits no edema.  Lymphadenopathy:    She has no cervical adenopathy.  Neurological: She is alert and oriented to person, place, and time.  Skin: Skin is warm and dry. No rash noted. She is not diaphoretic.  Psychiatric: Memory, affect and judgment normal.    Lab Results  Component Value Date   TSH 0.44 06/29/2011   Lab Results  Component Value Date   WBC 7.1 06/29/2011   HGB 14.0 06/29/2011   HCT 42.5 06/29/2011   MCV 94.2 06/29/2011   PLT 207.0 06/29/2011   Lab Results  Component Value Date   CREATININE 0.6 06/29/2011   BUN 14 06/29/2011   NA 141 06/29/2011   K 3.7 06/29/2011   CL 108 06/29/2011   CO2 24 06/29/2011   Lab Results  Component Value Date   ALT 56* 06/29/2011   AST 35 06/29/2011   ALKPHOS 82 06/29/2011   BILITOT 0.3 06/29/2011   Lab Results  Component Value Date   CHOL 205* 06/29/2011   Lab Results  Component Value Date   HDL 47.30 06/29/2011   No results found for this basename: LDLCALC   Lab Results  Component Value Date   TRIG 111.0 06/29/2011   Lab Results  Component Value Date   CHOLHDL 4 06/29/2011     Assessment & Plan  Tachycardia Mild today improved on recheck will continue to monitor  Knee pain, right Recently had an MRI with her orthopaedic group [facility] and agrees to let us get a copy of her MRI and her notes there. She is noted to have degenerative changes and effusions, they have  discussed the possibility of surgeries but for now she will proceed with PT and we will increase her pain meds for now and reassess in 3 weeks. She has signed and agreed to a pain contract.

## 2011-11-13 ENCOUNTER — Encounter (HOSPITAL_COMMUNITY): Payer: Self-pay | Admitting: *Deleted

## 2011-11-13 ENCOUNTER — Emergency Department (HOSPITAL_COMMUNITY)
Admission: EM | Admit: 2011-11-13 | Discharge: 2011-11-13 | Disposition: A | Discharge status: Home / Self Care | Payer: BC Managed Care – PPO | Attending: Emergency Medicine | Admitting: Emergency Medicine

## 2011-11-13 DIAGNOSIS — I10 Essential (primary) hypertension: Secondary | ICD-10-CM | POA: Insufficient documentation

## 2011-11-13 DIAGNOSIS — F329 Major depressive disorder, single episode, unspecified: Secondary | ICD-10-CM | POA: Insufficient documentation

## 2011-11-13 DIAGNOSIS — R5383 Other fatigue: Secondary | ICD-10-CM | POA: Insufficient documentation

## 2011-11-13 DIAGNOSIS — R5381 Other malaise: Secondary | ICD-10-CM | POA: Insufficient documentation

## 2011-11-13 DIAGNOSIS — M329 Systemic lupus erythematosus, unspecified: Secondary | ICD-10-CM | POA: Insufficient documentation

## 2011-11-13 DIAGNOSIS — F17200 Nicotine dependence, unspecified, uncomplicated: Secondary | ICD-10-CM | POA: Insufficient documentation

## 2011-11-13 DIAGNOSIS — F3289 Other specified depressive episodes: Secondary | ICD-10-CM | POA: Insufficient documentation

## 2011-11-13 DIAGNOSIS — M129 Arthropathy, unspecified: Secondary | ICD-10-CM | POA: Insufficient documentation

## 2011-11-13 DIAGNOSIS — R000 Tachycardia, unspecified: Secondary | ICD-10-CM | POA: Insufficient documentation

## 2011-11-13 DIAGNOSIS — E860 Dehydration: Secondary | ICD-10-CM | POA: Insufficient documentation

## 2011-11-13 DIAGNOSIS — R531 Weakness: Secondary | ICD-10-CM

## 2011-11-13 DIAGNOSIS — F411 Generalized anxiety disorder: Secondary | ICD-10-CM | POA: Insufficient documentation

## 2011-11-13 LAB — URINALYSIS, ROUTINE W REFLEX MICROSCOPIC
Glucose, UA: NEGATIVE mg/dL
Leukocytes, UA: NEGATIVE
Nitrite: NEGATIVE
Specific Gravity, Urine: 1.015 (ref 1.005–1.030)
pH: 6 (ref 5.0–8.0)

## 2011-11-13 LAB — CBC WITH DIFFERENTIAL/PLATELET
Eosinophils Absolute: 0 10*3/uL (ref 0.0–0.7)
Hemoglobin: 14.4 g/dL (ref 12.0–15.0)
Lymphocytes Relative: 28 % (ref 12–46)
Lymphs Abs: 1.4 10*3/uL (ref 0.7–4.0)
MCH: 31.3 pg (ref 26.0–34.0)
Monocytes Relative: 7 % (ref 3–12)
Neutro Abs: 3.3 10*3/uL (ref 1.7–7.7)
Neutrophils Relative %: 64 % (ref 43–77)
Platelets: 183 10*3/uL (ref 150–400)
RBC: 4.6 MIL/uL (ref 3.87–5.11)
WBC: 5.1 10*3/uL (ref 4.0–10.5)

## 2011-11-13 LAB — BASIC METABOLIC PANEL
Calcium: 8.9 mg/dL (ref 8.4–10.5)
Chloride: 98 mEq/L (ref 96–112)
Chloride: 106 mEq/L (ref 96–112)
Creatinine, Ser: 1.66 mg/dL — ABNORMAL HIGH (ref 0.50–1.10)
GFR calc Af Amer: 43 mL/min — ABNORMAL LOW (ref >90)
GFR calc non Af Amer: 32 mL/min — ABNORMAL LOW (ref >90)
Glucose, Bld: 121 mg/dL — ABNORMAL HIGH (ref 70–99)
Potassium: 4.1 mEq/L (ref 3.5–5.1)
Sodium: 134 mEq/L — ABNORMAL LOW (ref 135–145)
Sodium: 139 mEq/L (ref 135–145)

## 2011-11-13 LAB — GLUCOSE, CAPILLARY

## 2011-11-13 LAB — PREGNANCY, URINE: Preg Test, Ur: NEGATIVE

## 2011-11-13 MED ORDER — SODIUM CHLORIDE 0.9 % IV BOLUS (SEPSIS)
1000.0000 mL | Freq: Once | INTRAVENOUS | Status: AC
  Administered 2011-11-13: 1000 mL via INTRAVENOUS

## 2011-11-13 MED ORDER — LACTATED RINGERS IV BOLUS (SEPSIS)
2000.0000 mL | Freq: Once | INTRAVENOUS | Status: AC
  Administered 2011-11-13: 2000 mL via INTRAVENOUS

## 2011-11-13 NOTE — ED Notes (Signed)
Patient states weakness all over, patient denies pain and is oriented x 4, patient is slow to form responses to questions intermitantly,

## 2011-11-13 NOTE — ED Notes (Signed)
Pt. Came out of the Bathroom and the IV had disconnected.  Pt. Back in bed, cleaned and re-connected IVF.  Changed her gown and bed.

## 2011-11-13 NOTE — ED Provider Notes (Signed)
History     CSN: [id number]  Arrival date & time 11/13/11  1308   First MD Initiated Contact with Patient 11/13/11 1418      Chief Complaint  Patient presents with  . Weakness    (Consider location/radiation/quality/duration/timing/severity/associated sxs/prior treatment) HPIStephanie H Boyle is a 41 y.o. female with a complicated history of lupus, anxiety and depression who presents with generalized weakness. Patient says she's had no dysarthria or motor weakness, she just feels tired and weak all over. She does take potassium supplementation, and she's had this before. She's been dehydrated in the past. She admits to poor by mouth intake over the last few days. Currently she is having no pain. She describes her symptoms as worsening, currently moderate to severe, not alleviated by home medicines, not associated with syncope, chest pain, shortness of breath, diarrhea or vomiting. No GI or GU bleeding.  Past Medical History  Diagnosis Date  . Arthritis     hands, hips, knees, back feet  . Depression   . Lupus 2004  . PONV (postoperative nausea and vomiting)   . Syncope, cardiogenic 2002    treated with toprol  . Anxiety   . Heart murmur   . Chicken pox as a child  . Hypertension   . Tachycardia 06/29/2011  . Anxiety and depression 01/04/2011  . Headache disorder 05/16/2011  . Pain, dental 07/19/2011  . Pedal edema 08/14/2011  . Sinusitis acute 08/29/2011  . Knee pain, right 11/12/2011    Past Surgical History  Procedure Date  . Abdominal hysterectomy 2010    had uterus left ovary removed 2010, right ovary removed 8/12  . Cholecystectomy 2005  . Breast surgery 1995    lumpectomy of left breast- benign  . Right wrist nerve repair - 2008 2008    fell on nail, repair    Family History  Problem Relation Age of Onset  . Hyperlipidemia Mother   . Hypertension Mother   . Stroke Mother     X 5 mini  . Cancer Mother 75    cervical  . Heart disease Mother   . Hypertension  Father   . Heart disease Father     cad with stent  . Arthritis Father     2 blown discs  . Stroke Maternal Grandmother   . Lupus Maternal Grandmother   . Heart disease Paternal Grandfather     History  Substance Use Topics  . Smoking status: Current Some Day Smoker -- 0.2 packs/day for 10 years    Types: Cigarettes  . Smokeless tobacco: Never Used  . Alcohol Use: No    OB History    Grav Para Term Preterm Abortions TAB SAB Ect Mult Living                  Review of Systems At least 10pt or greater review of systems completed and are negative except where specified in the HPI.  Allergies  Morphine and related and Codeine  Home Medications   Current Outpatient Rx  Name Route Sig Dispense Refill  . ALBUTEROL SULFATE HFA 108 (90 BASE) MCG/ACT IN AERS Inhalation Inhale 2 puffs into the lungs every 4 (four) hours as needed for wheezing. For exercise induced asthma 1 Inhaler 5  . AMLODIPINE BESYLATE 10 MG PO TABS Oral Take 1 tablet (10 mg total) by mouth daily. 30 tablet 5  . DIAZEPAM 10 MG PO TABS Oral Take 1 tablet (10 mg total) by mouth every 12 (twelve) hours as needed  for anxiety or sleep (may try 1/2 to 1 tab at a time as directed). 60 tablet 0  . ESOMEPRAZOLE MAGNESIUM 40 MG PO CPDR Oral Take 1 capsule (40 mg total) by mouth daily before breakfast. 30 capsule 2  . ESTROGEL TD Transdermal Place 1 application onto the skin every evening.     . GUAIFENESIN ER 600 MG PO TB12 Oral Take 600 mg by mouth 2 (two) times daily as needed. congestion    . HYDROXYCHLOROQUINE SULFATE 200 MG PO TABS Oral Take 200 mg by mouth daily.     . IBUPROFEN 200 MG PO TABS Oral Take 400 mg by mouth every 6 (six) hours as needed. For pain.    Marland Kitchen METOPROLOL SUCCINATE ER 100 MG PO TB24 Oral Take 1 tablet (100 mg total) by mouth daily. 30 tablet 4  . ADULT MULTIVITAMIN W/MINERALS CH Oral Take 1 tablet by mouth daily.    . OXYCODONE HCL 10 MG PO TABS Oral Take 10 mg by mouth 3 (three) times daily as  needed. pain    . POTASSIUM CITRATE PO Oral Take 1 tablet by mouth daily.    . SUMATRIPTAN SUCCINATE 50 MG PO TABS  ONE TABLET AT START OF MIGRAINE. MAY REPEAT IN 2 HOURS AS NEEDED. MAX 2 TABS/DAY 10 tablet 1  . VENLAFAXINE HCL ER 150 MG PO CP24 Oral Take 1 capsule (150 mg total) by mouth daily. 30 capsule 3  . VITAMIN C 500 MG PO TABS Oral Take 500 mg by mouth daily.      BP 153/72  Pulse 69  Temp 99.1 F (37.3 C) (Oral)  Resp 16  SpO2 100%  Physical Exam  Nursing notes reviewed.  Electronic medical record reviewed. VITAL SIGNS:   Filed Vitals:   11/13/11 1448 11/13/11 1449 11/13/11 1450 11/13/11 1715  BP: 170/74 176/64 153/72 148/69  Pulse: 56 59 69 62  Temp:      TempSrc:      Resp:    16  SpO2:    100%   CONSTITUTIONAL: Awake, oriented, appears non-toxic HENT: Atraumatic, normocephalic, oral mucosa pink and tacky, airway patent. Nares patent without drainage. External ears normal. EYES: Conjunctiva clear, EOMI, PERRLA NECK: Trachea midline, non-tender, supple CARDIOVASCULAR: Normal heart rate, Normal rhythm, No murmurs, rubs, gallops PULMONARY/CHEST: Clear to auscultation, no rhonchi, wheezes, or rales. Symmetrical breath sounds. Non-tender. ABDOMINAL: Non-distended, soft, non-tender - no rebound or guarding.  BS normal. NEUROLOGIC: Non-focal, moving all four extremities, no gross sensory or motor deficits. EXTREMITIES: No clubbing, cyanosis, or edema SKIN: Warm, Dry, No erythema, No rash  ED Course  Procedures (including critical care time)  Labs Reviewed  URINALYSIS, ROUTINE W REFLEX MICROSCOPIC - Abnormal; Notable for the following:    Color, Urine AMBER (*)  BIOCHEMICALS MAY BE AFFECTED BY COLOR   APPearance CLOUDY (*)     Bilirubin Urine SMALL (*)     All other components within normal limits  BASIC METABOLIC PANEL - Abnormal; Notable for the following:    Sodium 134 (*)     Glucose, Bld 121 (*)     BUN 31 (*)     Creatinine, Ser 1.87 (*)     GFR calc non  Af Amer 32 (*)     GFR calc Af Amer 37 (*)     All other components within normal limits  GLUCOSE, CAPILLARY - Abnormal; Notable for the following:    Glucose-Capillary 126 (*)     All other components within normal limits  BASIC METABOLIC PANEL - Abnormal; Notable for the following:    BUN 26 (*)     Creatinine, Ser 1.66 (*)     GFR calc non Af Amer 37 (*)     GFR calc Af Amer 43 (*)     All other components within normal limits  PREGNANCY, URINE  CBC WITH DIFFERENTIAL   No results found.   1. Dehydration   2. Weakness       MDM  LIONEL EYERMAN is a 41 y.o. female history of lupus presents with weakness. Patient appears dehydrated on physical exam.  Labs show that the patient is mildly dehydrated with an increase in her BUN and creatinine consistent with prerenal azotemia.  Give the patient some fluid along with dehydration protocol in the CDU. I do not think the patient has any life-threatening intra-abdominal emergency at this time. I think the BUN and creatinine elevation over her baseline is likely due to dehydration since she has been admittedly not taking in enough food or fluids. Patient has not seen her rheumatologist in about 5 months. She is switching rheumatologist and is advised to follow up with a rheumatologist in the next 2 days to have her labs rechecked - do not think she needs steroids at this time, I do not think this is reflective of intrinsic renal failure secondary to lupus.  I explained the diagnosis and have given explicit precautions to return to the ER including any other new or worsening symptoms. The patient understands and accepts the medical plan as it's been dictated and I have answered their questions. Discharge instructions concerning home care and prescriptions have been given.  The patient is STABLE and is discharged to home in good condition.          Jones Skene, MD 11/14/11 [phone]

## 2011-11-13 NOTE — ED Notes (Signed)
Pt was placed on Norvasc and when her electrolytes get out of wack it makes her feel weak.  Pt has not been taking Norvasc.  Pt has blocked ear tubes.  Pt started having problems getting words out since yesterday and feels weak and shakey all over.    Pt follows commands and has weakness all over.

## 2011-11-13 NOTE — ED Provider Notes (Signed)
Patient feeling better after IV fluids.  Ambulatory to bathroom without difficulty.  Lab results reviewed, discussed with patient.  Patient is currently without a rheumatologist, is trying to establish with [facility].  Patient does have a PCP in [facility].  Patient encouraged to schedule a follow-up with a rheumatologist as soon as possible--if unable, patient asked to follow-up with her PCP to monitor renal function.  Jimmye Norman, NP 11/13/11 [phone]

## 2011-11-13 NOTE — ED Provider Notes (Signed)
Medical screening examination/treatment/procedure(s) were performed by non-physician practitioner and as supervising physician I was immediately available for consultation/collaboration.   Charles B. Bernette Mayers, MD 11/13/11 [phone]

## 2011-11-13 NOTE — ED Provider Notes (Signed)
MSE was initiated and I personally evaluated the patient and placed orders (if any) at  2:40 PM on November 13, 2011.  The patient appears stable so that the remainder of the MSE may be completed by another provider.  Carlyle Dolly, PA-C 11/13/11 1440

## 2011-11-13 NOTE — ED Provider Notes (Signed)
Medical screening examination/treatment/procedure(s) were performed by non-physician practitioner and as supervising physician I was immediately available for consultation/collaboration.  Jones Skene, M.D.     Jones Skene, MD 11/13/11 2358

## 2011-11-17 ENCOUNTER — Other Ambulatory Visit: Payer: Self-pay | Admitting: Emergency Medicine

## 2011-11-17 ENCOUNTER — Ambulatory Visit: Payer: BC Managed Care – PPO | Admitting: Family Medicine

## 2011-11-17 DIAGNOSIS — F329 Major depressive disorder, single episode, unspecified: Secondary | ICD-10-CM

## 2011-11-17 MED ORDER — DIAZEPAM 10 MG PO TABS: 10.0000 mg | ORAL_TABLET | Freq: Two times a day (BID) | ORAL | Status: DC | PRN

## 2011-11-17 NOTE — Telephone Encounter (Signed)
RX faxed and will inform pt

## 2011-11-17 NOTE — Telephone Encounter (Signed)
Please advise? Last RX  was 10-16-11 quantity 60 with 0 refill.

## 2011-11-17 NOTE — Telephone Encounter (Signed)
Patient states she needs her Valium refilled and needs to know if she needs to pickup prescription or is it being sent to pharmacy.

## 2011-11-17 NOTE — Telephone Encounter (Signed)
OK to refill Diazepam 10 mg with same sig, #60, 1 rf

## 2011-11-18 ENCOUNTER — Encounter (HOSPITAL_COMMUNITY): Payer: Self-pay | Admitting: *Deleted

## 2011-11-18 ENCOUNTER — Emergency Department (HOSPITAL_COMMUNITY)
Admission: EM | Admit: 2011-11-18 | Discharge: 2011-11-18 | Disposition: A | Discharge status: Home / Self Care | Payer: BC Managed Care – PPO | Attending: Emergency Medicine | Admitting: Emergency Medicine

## 2011-11-18 DIAGNOSIS — Z8739 Personal history of other diseases of the musculoskeletal system and connective tissue: Secondary | ICD-10-CM | POA: Insufficient documentation

## 2011-11-18 DIAGNOSIS — I10 Essential (primary) hypertension: Secondary | ICD-10-CM | POA: Insufficient documentation

## 2011-11-18 DIAGNOSIS — F341 Dysthymic disorder: Secondary | ICD-10-CM | POA: Insufficient documentation

## 2011-11-18 DIAGNOSIS — Z79899 Other long term (current) drug therapy: Secondary | ICD-10-CM | POA: Insufficient documentation

## 2011-11-18 DIAGNOSIS — M329 Systemic lupus erythematosus, unspecified: Secondary | ICD-10-CM | POA: Insufficient documentation

## 2011-11-18 DIAGNOSIS — N12 Tubulo-interstitial nephritis, not specified as acute or chronic: Secondary | ICD-10-CM

## 2011-11-18 DIAGNOSIS — F17200 Nicotine dependence, unspecified, uncomplicated: Secondary | ICD-10-CM | POA: Insufficient documentation

## 2011-11-18 DIAGNOSIS — Z9089 Acquired absence of other organs: Secondary | ICD-10-CM | POA: Insufficient documentation

## 2011-11-18 LAB — URINALYSIS, ROUTINE W REFLEX MICROSCOPIC
Ketones, ur: 15 mg/dL — AB
Nitrite: POSITIVE — AB
Protein, ur: NEGATIVE mg/dL

## 2011-11-18 LAB — BASIC METABOLIC PANEL
BUN: 10 mg/dL (ref 6–23)
Calcium: 9.7 mg/dL (ref 8.4–10.5)
Chloride: 102 mEq/L (ref 96–112)
Creatinine, Ser: 0.75 mg/dL (ref 0.50–1.10)
GFR calc Af Amer: >90 mL/min (ref >90)
GFR calc non Af Amer: >90 mL/min (ref >90)

## 2011-11-18 LAB — CBC WITH DIFFERENTIAL/PLATELET
Basophils Absolute: 0 10*3/uL (ref 0.0–0.1)
Basophils Relative: 0 % (ref 0–1)
Eosinophils Absolute: 0.1 10*3/uL (ref 0.0–0.7)
HCT: 43.8 % (ref 36.0–46.0)
MCH: 32 pg (ref 26.0–34.0)
MCHC: 35.6 g/dL (ref 30.0–36.0)
Monocytes Absolute: 1.4 10*3/uL — ABNORMAL HIGH (ref 0.1–1.0)
Monocytes Relative: 14 % — ABNORMAL HIGH (ref 3–12)
Neutro Abs: 5.9 10*3/uL (ref 1.7–7.7)
Neutrophils Relative %: 59 % (ref 43–77)
RDW: 15.1 % (ref 11.5–15.5)

## 2011-11-18 LAB — URINE MICROSCOPIC-ADD ON

## 2011-11-18 LAB — CK: Total CK: 54 U/L (ref 7–177)

## 2011-11-18 MED ORDER — SODIUM CHLORIDE 0.9 % IV BOLUS (SEPSIS)
1000.0000 mL | Freq: Once | INTRAVENOUS | Status: AC
  Administered 2011-11-18: 1000 mL via INTRAVENOUS

## 2011-11-18 MED ORDER — ONDANSETRON HCL 4 MG/2ML IJ SOLN
4.0000 mg | Freq: Once | INTRAMUSCULAR | Status: AC
  Administered 2011-11-18: 4 mg via INTRAVENOUS
  Filled 2011-11-18: qty 2

## 2011-11-18 MED ORDER — HYDROCODONE-ACETAMINOPHEN 5-325 MG PO TABS: 1.0000 | ORAL_TABLET | Freq: Four times a day (QID) | ORAL | Status: DC | PRN

## 2011-11-18 MED ORDER — ONDANSETRON HCL 4 MG PO TABS: 4.0000 mg | ORAL_TABLET | Freq: Three times a day (TID) | ORAL | Status: DC | PRN

## 2011-11-18 MED ORDER — CEFPODOXIME PROXETIL 200 MG PO TABS: 200.0000 mg | ORAL_TABLET | Freq: Two times a day (BID) | ORAL | Status: DC

## 2011-11-18 MED ORDER — HYDROMORPHONE HCL PF 1 MG/ML IJ SOLN
1.0000 mg | Freq: Once | INTRAMUSCULAR | Status: AC
  Administered 2011-11-18: 1 mg via INTRAVENOUS
  Filled 2011-11-18: qty 1

## 2011-11-18 MED ORDER — DEXTROSE 5 % IV SOLN
1.0000 g | Freq: Once | INTRAVENOUS | Status: AC
  Administered 2011-11-18: 1 g via INTRAVENOUS
  Filled 2011-11-18: qty 10

## 2011-11-18 NOTE — ED Provider Notes (Signed)
History     CSN: [id number]  Arrival date & time 11/18/11  1553   First MD Initiated Contact with Patient 11/18/11 1827      Chief Complaint  Patient presents with  . Flank Pain  . Fever  . Nausea    (Consider location/radiation/quality/duration/timing/severity/associated sxs/prior treatment) Patient is a 41 y.o. female presenting with flank pain and fever. The history is provided by the patient.  Flank Pain This is a recurrent problem. The current episode started 1 to 4 weeks ago. The problem occurs constantly. The problem has been unchanged. Associated symptoms include anorexia, chills, diaphoresis, fatigue, a fever, myalgias, nausea, urinary symptoms and weakness. Pertinent negatives include no abdominal pain, change in bowel habit, chest pain, congestion, coughing, headaches, neck pain, numbness or vomiting. Nothing aggravates the symptoms. She has tried nothing for the symptoms. The treatment provided no relief.  Fever Primary symptoms of the febrile illness include fever, fatigue, nausea, dysuria and myalgias. Primary symptoms do not include headaches, cough, shortness of breath, abdominal pain, vomiting or diarrhea.  The dysuria is associated with frequency and urgency.  The myalgias are associated with weakness.    Past Medical History  Diagnosis Date  . Arthritis     hands, hips, knees, back feet  . Depression   . Lupus 2004  . PONV (postoperative nausea and vomiting)   . Syncope, cardiogenic 2002    treated with toprol  . Anxiety   . Heart murmur   . Chicken pox as a child  . Hypertension   . Tachycardia 06/29/2011  . Anxiety and depression 01/04/2011  . Headache disorder 05/16/2011  . Pain, dental 07/19/2011  . Pedal edema 08/14/2011  . Sinusitis acute 08/29/2011  . Knee pain, right 11/12/2011    Past Surgical History  Procedure Date  . Abdominal hysterectomy 2010    had uterus left ovary removed 2010, right ovary removed 8/12  . Cholecystectomy 2005  .  Breast surgery 1995    lumpectomy of left breast- benign  . Right wrist nerve repair - 2008 2008    fell on nail, repair    Family History  Problem Relation Age of Onset  . Hyperlipidemia Mother   . Hypertension Mother   . Stroke Mother     X 5 mini  . Cancer Mother 21    cervical  . Heart disease Mother   . Hypertension Father   . Heart disease Father     cad with stent  . Arthritis Father     2 blown discs  . Stroke Maternal Grandmother   . Lupus Maternal Grandmother   . Heart disease Paternal Grandfather     History  Substance Use Topics  . Smoking status: Current Some Day Smoker -- 0.2 packs/day for 10 years    Types: Cigarettes  . Smokeless tobacco: Never Used  . Alcohol Use: No    OB History    Grav Para Term Preterm Abortions TAB SAB Ect Mult Living                  Review of Systems  Constitutional: Positive for fever, chills, diaphoresis and fatigue.  HENT: Negative for congestion and neck pain.   Respiratory: Negative for cough and shortness of breath.   Cardiovascular: Negative for chest pain.  Gastrointestinal: Positive for nausea and anorexia. Negative for vomiting, abdominal pain, diarrhea and change in bowel habit.  Genitourinary: Positive for dysuria, urgency, frequency and flank pain.  Musculoskeletal: Positive for myalgias.  Neurological: Positive  for weakness. Negative for numbness and headaches.  All other systems reviewed and are negative.    Allergies  Morphine and related and Codeine  Home Medications   Current Outpatient Rx  Name Route Sig Dispense Refill  . ALBUTEROL SULFATE HFA 108 (90 BASE) MCG/ACT IN AERS Inhalation Inhale 2 puffs into the lungs every 4 (four) hours as needed for wheezing. For exercise induced asthma 1 Inhaler 5  . AMLODIPINE BESYLATE 10 MG PO TABS Oral Take 5 mg by mouth daily.    Marland Kitchen DIAZEPAM 10 MG PO TABS Oral Take 10 mg by mouth every 12 (twelve) hours as needed. For anxiety    . ESOMEPRAZOLE MAGNESIUM 40 MG  PO CPDR Oral Take 1 capsule (40 mg total) by mouth daily before breakfast. 30 capsule 2  . ESTROGEL TD Transdermal Place 1 application onto the skin every evening.     . GUAIFENESIN ER 600 MG PO TB12 Oral Take 600 mg by mouth 2 (two) times daily as needed. For congestion    . HYDROXYCHLOROQUINE SULFATE 200 MG PO TABS Oral Take 200 mg by mouth daily.     . IBUPROFEN 200 MG PO TABS Oral Take 400 mg by mouth every 6 (six) hours as needed. For pain.    Marland Kitchen METOPROLOL SUCCINATE ER 100 MG PO TB24 Oral Take 1 tablet (100 mg total) by mouth daily. 30 tablet 4  . ADULT MULTIVITAMIN W/MINERALS CH Oral Take 1 tablet by mouth daily.    . OXYCODONE HCL 10 MG PO TABS Oral Take 10 mg by mouth 3 (three) times daily as needed. For pain    . POTASSIUM CITRATE PO Oral Take 1 tablet by mouth daily.    . SUMATRIPTAN SUCCINATE 50 MG PO TABS Oral Take 50 mg by mouth every 2 (two) hours as needed. For migraines    . VENLAFAXINE HCL ER 150 MG PO CP24 Oral Take 1 capsule (150 mg total) by mouth daily. 30 capsule 3  . VITAMIN C 500 MG PO TABS Oral Take 500 mg by mouth daily.    . CEFPODOXIME PROXETIL 200 MG PO TABS Oral Take 1 tablet (200 mg total) by mouth 2 (two) times daily. 14 tablet 0  . HYDROCODONE-ACETAMINOPHEN 5-325 MG PO TABS Oral Take 1-2 tablets by mouth every 6 (six) hours as needed for pain. 15 tablet 0  . ONDANSETRON HCL 4 MG PO TABS Oral Take 1 tablet (4 mg total) by mouth every 8 (eight) hours as needed for nausea. 12 tablet 0    BP 116/81  Pulse 89  Temp 98.1 F (36.7 C) (Oral)  Resp 20  SpO2 100%  Physical Exam  Nursing note and vitals reviewed. Constitutional: She is oriented to person, place, and time. She appears well-developed and well-nourished. No distress.  HENT:  Head: Normocephalic and atraumatic.  Eyes: EOM are normal. Pupils are equal, round, and reactive to light.  Neck: Normal range of motion.  Cardiovascular: Normal rate and normal heart sounds.   Pulmonary/Chest: Effort normal and  breath sounds normal. No respiratory distress.  Abdominal: Soft. She exhibits no distension. There is no tenderness. There is CVA tenderness (bilateral, right worse than left). There is no rigidity, no rebound and no guarding.  Musculoskeletal: Normal range of motion.  Neurological: She is alert and oriented to person, place, and time.  Skin: Skin is warm and dry.    ED Course  Procedures (including critical care time)  Labs Reviewed  URINALYSIS, ROUTINE W REFLEX MICROSCOPIC - Abnormal;  Notable for the following:    Color, Urine ORANGE (*)  BIOCHEMICALS MAY BE AFFECTED BY COLOR   APPearance CLOUDY (*)     Bilirubin Urine MODERATE (*)     Ketones, ur 15 (*)     Nitrite POSITIVE (*)     Leukocytes, UA SMALL (*)     All other components within normal limits  CBC WITH DIFFERENTIAL - Abnormal; Notable for the following:    Hemoglobin 15.6 (*)     Monocytes Relative 14 (*)     Monocytes Absolute 1.4 (*)     All other components within normal limits  BASIC METABOLIC PANEL - Abnormal; Notable for the following:    Potassium 3.4 (*)     Glucose, Bld 112 (*)     All other components within normal limits  URINE MICROSCOPIC-ADD ON - Abnormal; Notable for the following:    Squamous Epithelial / LPF MANY (*)     Bacteria, UA FEW (*)     All other components within normal limits  CK   No results found.   1. Pyelonephritis       MDM  Pt seen and examined. Pt with over a week of CVA pain and dark urine. She was seen here previously with normal u/a, now with signs of infection. Will treat and hydrate. AKI has resolved from previous visit.  8:34 PM Pt feeling much improved. Will discharge home with instructions to follow with her PCP in a week to ensure resolution of symptoms.     Daleen Bo, MD 11/18/11 2102

## 2011-11-18 NOTE — ED Notes (Signed)
Pt reports weak long hx of dark urine, dehydration, fever, and fatigue. Was seen for same on the 14th and reports she is still having symptoms. Reports left sided flank pain and fever at night. Reports nausea. No vomiting or diarrhea.

## 2011-11-18 NOTE — ED Provider Notes (Signed)
I saw and evaluated the patient, reviewed the resident's note and I agree with the findings and plan.  Pt with flank pain, dark urine and fatigue. Pt feeling better after IVF, has UTI, ready to go home with Abx.   Charles B. Bernette Mayers, MD 11/18/11 2029

## 2011-11-18 NOTE — ED Notes (Signed)
Pt presents to department for evaluation of bilateral flank pain, dark colored urine and diarrhea. Ongoing x1 week, was seen previously for same. States no relief from pain at home. Also states nausea and generalized fatigue. 7/10 pain at the time. 101 fever earlier today. Tenderness noted to bilateral flank regions. She is conscious alert and oriented x4.

## 2011-11-18 NOTE — ED Notes (Signed)
Family at bedside. 

## 2011-11-27 ENCOUNTER — Ambulatory Visit: Payer: BC Managed Care – PPO | Admitting: Family Medicine

## 2011-11-27 ENCOUNTER — Encounter: Payer: Self-pay | Admitting: Family Medicine

## 2011-11-27 ENCOUNTER — Ambulatory Visit (INDEPENDENT_AMBULATORY_CARE_PROVIDER_SITE_OTHER): Payer: BC Managed Care – PPO | Admitting: Family Medicine

## 2011-11-27 VITALS — BP 130/85 | HR 89 | Temp 97.6°F | Ht 65.5 in | Wt 163.8 lb

## 2011-11-27 DIAGNOSIS — R52 Pain, unspecified: Secondary | ICD-10-CM

## 2011-11-27 DIAGNOSIS — N390 Urinary tract infection, site not specified: Secondary | ICD-10-CM | POA: Insufficient documentation

## 2011-11-27 DIAGNOSIS — I10 Essential (primary) hypertension: Secondary | ICD-10-CM

## 2011-11-27 DIAGNOSIS — F419 Anxiety disorder, unspecified: Secondary | ICD-10-CM

## 2011-11-27 DIAGNOSIS — G8929 Other chronic pain: Secondary | ICD-10-CM

## 2011-11-27 DIAGNOSIS — F341 Dysthymic disorder: Secondary | ICD-10-CM

## 2011-11-27 HISTORY — DX: Urinary tract infection, site not specified: N39.0

## 2011-11-27 LAB — POCT URINALYSIS DIPSTICK
Leukocytes, UA: NEGATIVE
Protein, UA: NEGATIVE
Urobilinogen, UA: 0.2

## 2011-11-27 MED ORDER — CIPROFLOXACIN HCL 500 MG PO TABS: 500.0000 mg | ORAL_TABLET | Freq: Two times a day (BID) | ORAL | Status: DC

## 2011-11-27 MED ORDER — PROBIOTIC PRODUCT PO CHEW: CHEWABLE_TABLET | ORAL | Status: DC

## 2011-11-27 MED ORDER — PHENAZOPYRIDINE HCL 200 MG PO TABS: 200.0000 mg | ORAL_TABLET | Freq: Three times a day (TID) | ORAL | Status: DC | PRN

## 2011-11-27 MED ORDER — OXYCODONE HCL 10 MG PO TABS: ORAL_TABLET | ORAL | Status: DC
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
## 2011-11-27 NOTE — Assessment & Plan Note (Addendum)
Treated by ED on 11/18/11, urinary symptoms improving, urinalysis better today but will send for culture due to the recent infection and affect she is leaving town for 2-3 months to take her daughter to rehabilitation. Is given a prescription for ciprofloxacin and pretty him to take as needed. Encouraged probiotics and increase hydration.

## 2011-11-27 NOTE — Progress Notes (Signed)
Quick Note:  Patient Informed and voiced understanding ______ 

## 2011-11-27 NOTE — Patient Instructions (Addendum)
Digestive health probiotics by [employer]   Urinary Tract Infection Urinary tract infections (UTIs) can develop anywhere along your urinary tract. Your urinary tract is your body's drainage system for removing wastes and extra water. Your urinary tract includes two kidneys, two ureters, a bladder, and a urethra. Your kidneys are a pair of bean-shaped organs. Each kidney is about the size of your fist. They are located below your ribs, one on each side of your spine. CAUSES Infections are caused by microbes, which are microscopic organisms, including fungi, viruses, and bacteria. These organisms are so small that they can only be seen through a microscope. Bacteria are the microbes that most commonly cause UTIs. SYMPTOMS  Symptoms of UTIs may vary by age and gender of the patient and by the location of the infection. Symptoms in young women typically include a frequent and intense urge to urinate and a painful, burning feeling in the bladder or urethra during urination. Older women and men are more likely to be tired, shaky, and weak and have muscle aches and abdominal pain. A fever may mean the infection is in your kidneys. Other symptoms of a kidney infection include pain in your back or sides below the ribs, nausea, and vomiting. DIAGNOSIS To diagnose a UTI, your caregiver will ask you about your symptoms. Your caregiver also will ask to provide a urine sample. The urine sample will be tested for bacteria and white blood cells. White blood cells are made by your body to help fight infection. TREATMENT  Typically, UTIs can be treated with medication. Because most UTIs are caused by a bacterial infection, they usually can be treated with the use of antibiotics. The choice of antibiotic and length of treatment depend on your symptoms and the type of bacteria causing your infection. HOME CARE INSTRUCTIONS  If you were prescribed antibiotics, take them exactly as your caregiver instructs you. Finish the  medication even if you feel better after you have only taken some of the medication.  Drink enough water and fluids to keep your urine clear or pale yellow.  Avoid caffeine, tea, and carbonated beverages. They tend to irritate your bladder.  Empty your bladder often. Avoid holding urine for long periods of time.  Empty your bladder before and after sexual intercourse.  After a bowel movement, women should cleanse from front to back. Use each tissue only once. SEEK MEDICAL CARE IF:   You have back pain.  You develop a fever.  Your symptoms do not begin to resolve within 3 days. SEEK IMMEDIATE MEDICAL CARE IF:   You have severe back pain or lower abdominal pain.  You develop chills.  You have nausea or vomiting.  You have continued burning or discomfort with urination. MAKE SURE YOU:   Understand these instructions.  Will watch your condition.  Will get help right away if you are not doing well or get worse. Document Released: 10/26/2004 Document Revised: 07/18/2011 Document Reviewed: 02/24/2011 [facility] Patient Information 2013 [place], [place].

## 2011-11-27 NOTE — Addendum Note (Signed)
Addended by: Baldemar Lenis R on: 11/27/2011 01:39 PM   Modules accepted: Orders

## 2011-11-27 NOTE — Assessment & Plan Note (Signed)
Well-controlled  at this time 

## 2011-11-27 NOTE — Assessment & Plan Note (Signed)
She will be out of the state for 2 months possibly longer due to her daughter's rehab. She is allowed her Oxycodone 10 mg to read 1 every 3 hours with the understanding that she is to only take 4 a day max so this amount can last her 2 months. We will see her on return for further consideration of pain management. She expresses understanding

## 2011-11-28 ENCOUNTER — Other Ambulatory Visit: Payer: Self-pay

## 2011-11-28 MED ORDER — AMLODIPINE BESYLATE 10 MG PO TABS: 10.0000 mg | ORAL_TABLET | Freq: Every day | ORAL | Status: DC

## 2011-11-29 LAB — URINE CULTURE

## 2011-12-01 ENCOUNTER — Ambulatory Visit: Payer: BC Managed Care – PPO | Admitting: Family Medicine

## 2011-12-01 ENCOUNTER — Other Ambulatory Visit: Payer: Self-pay

## 2011-12-01 MED ORDER — SUMATRIPTAN SUCCINATE 50 MG PO TABS: 50.0000 mg | ORAL_TABLET | ORAL | Status: DC | PRN

## 2011-12-01 NOTE — Telephone Encounter (Signed)
Please advise refill? I spoke to [facility] and pt called in this medication to pick up on 12-05-11

## 2011-12-01 NOTE — Telephone Encounter (Signed)
RX done. 

## 2011-12-01 NOTE — Telephone Encounter (Signed)
OK to refill Sumatriptan 50 mg as previously prescribed with same sig, #10, 2 rf

## 2011-12-06 ENCOUNTER — Telehealth: Payer: Self-pay

## 2011-12-06 NOTE — Telephone Encounter (Signed)
I called [facility] and the pharmacist states that the Imitrex was picked up on the 1st by Ignacia Felling

## 2012-01-15 DIAGNOSIS — F1120 Opioid dependence, uncomplicated: Secondary | ICD-10-CM | POA: Insufficient documentation

## 2012-03-05 ENCOUNTER — Other Ambulatory Visit: Payer: Self-pay | Admitting: Family Medicine

## 2012-03-05 NOTE — Telephone Encounter (Signed)
OK to d/c Ativan, restart Diazepam as previously prescribed, same strength, same sig, same number, no rf. Only if it is due

## 2012-03-05 NOTE — Telephone Encounter (Signed)
Please advise the valium refill?

## 2012-03-05 NOTE — Telephone Encounter (Signed)
Patient mentioned that Ativan was sent last time she requested refills. Patient is no longer taking Ativan, she takes Valium instead. Patient also made a fu appt 03/08/12.

## 2012-03-06 MED ORDER — DIAZEPAM 10 MG PO TABS: 10.0000 mg | ORAL_TABLET | Freq: Two times a day (BID) | ORAL | Status: DC | PRN

## 2012-03-06 NOTE — Addendum Note (Signed)
Addended by: Court Joy on: 03/06/2012 07:59 AM   Modules accepted: Orders

## 2012-03-08 ENCOUNTER — Encounter: Payer: Self-pay | Admitting: Family Medicine

## 2012-03-08 ENCOUNTER — Ambulatory Visit (INDEPENDENT_AMBULATORY_CARE_PROVIDER_SITE_OTHER): Payer: BC Managed Care – PPO | Admitting: Family Medicine

## 2012-03-08 VITALS — BP 162/112 | HR 94 | Temp 98.4°F | Ht 65.5 in | Wt 154.1 lb

## 2012-03-08 DIAGNOSIS — F341 Dysthymic disorder: Secondary | ICD-10-CM

## 2012-03-08 DIAGNOSIS — F329 Major depressive disorder, single episode, unspecified: Secondary | ICD-10-CM

## 2012-03-08 DIAGNOSIS — R52 Pain, unspecified: Secondary | ICD-10-CM

## 2012-03-08 DIAGNOSIS — F419 Anxiety disorder, unspecified: Secondary | ICD-10-CM

## 2012-03-08 DIAGNOSIS — M329 Systemic lupus erythematosus, unspecified: Secondary | ICD-10-CM

## 2012-03-08 DIAGNOSIS — G8929 Other chronic pain: Secondary | ICD-10-CM

## 2012-03-08 DIAGNOSIS — R000 Tachycardia, unspecified: Secondary | ICD-10-CM

## 2012-03-08 DIAGNOSIS — I10 Essential (primary) hypertension: Secondary | ICD-10-CM

## 2012-03-08 MED ORDER — LISINOPRIL 10 MG PO TABS: 10.0000 mg | ORAL_TABLET | Freq: Two times a day (BID) | ORAL | Status: DC

## 2012-03-08 MED ORDER — DIAZEPAM 10 MG PO TABS: 10.0000 mg | ORAL_TABLET | Freq: Two times a day (BID) | ORAL | Status: DC | PRN

## 2012-03-08 MED ORDER — OXYCODONE HCL 10 MG PO TABS: 10.0000 mg | ORAL_TABLET | Freq: Four times a day (QID) | ORAL | Status: DC | PRN

## 2012-03-08 NOTE — Patient Instructions (Addendum)
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
## 2012-03-10 NOTE — Assessment & Plan Note (Signed)
Mild today continue metoprolol avoid caffeine

## 2012-03-10 NOTE — Assessment & Plan Note (Signed)
Tolerating venlafaxine finds it helpful. May use diazepam when necessary but has started using that significantly less. Mostly just using at bedtime for good rest

## 2012-03-10 NOTE — Assessment & Plan Note (Signed)
Poorly controlled. We'll add lisinopril and reassess at next visit. Minimize sodium and caffeine.

## 2012-03-10 NOTE — Assessment & Plan Note (Signed)
Managing symptoms with increase exercise. Continues to struggle with diffuse pain however. Has knee pain, back pain, hand pain etc.

## 2012-03-10 NOTE — Assessment & Plan Note (Signed)
Is using much less pain meds. He has recently just come out of a intensive counseling and rehabilitation facility with her daughter. She is down to using just an occasional oxycodone was able to make her last prescription several months. She's given a refill today slightly fewer meds and the hope is that this will last her until her next visit in 3 months. She's encouraged to maintain good exercise and she is following with [facility] orthopedics for her knee pain we'll proceed with surgery there in the near future

## 2012-03-21 ENCOUNTER — Telehealth: Payer: Self-pay | Admitting: Family Medicine

## 2012-03-21 NOTE — Telephone Encounter (Signed)
Attempted to reach pt and left message to return my call. 

## 2012-03-21 NOTE — Telephone Encounter (Signed)
Patient left message on nurse voicemail stating that she is still experiencing high BP even though she is taking metoprolol, lisinopril, and amlodipine. She states that she is also having headaches from this and would like to know what Dr. Abner Greenspan recommends she do?

## 2012-03-22 ENCOUNTER — Other Ambulatory Visit: Payer: Self-pay | Admitting: *Deleted

## 2012-03-22 DIAGNOSIS — I10 Essential (primary) hypertension: Secondary | ICD-10-CM

## 2012-03-22 NOTE — Telephone Encounter (Signed)
Patient informed and labs/bp check scheduled

## 2012-03-22 NOTE — Telephone Encounter (Signed)
Spoke with pt, the following readings were taken each morning about 1 1/2 hours after taking her blood pressure medications. Pt reports compliance with meds. Please advise.  03/20/12 138/108 03/19/12 146/108 03/17/12 148/110 03/14/12 152/112

## 2012-03-22 NOTE — Telephone Encounter (Signed)
Have her increase her Lisinopril to 20 mg twice a day and come in in about 2-3 weeks for bp check and labs to check her renal function

## 2012-03-30 ENCOUNTER — Other Ambulatory Visit: Payer: Self-pay | Admitting: Family Medicine

## 2012-04-01 NOTE — Telephone Encounter (Signed)
Please advise Imitrex refill? Last RX was wrote on 12-01-11 quantity 10 with 2 refills.  If ok fax to [phone]

## 2012-04-05 ENCOUNTER — Other Ambulatory Visit: Payer: BC Managed Care – PPO

## 2012-04-18 ENCOUNTER — Other Ambulatory Visit: Payer: Self-pay

## 2012-04-18 MED ORDER — DIAZEPAM 10 MG PO TABS: 10.0000 mg | ORAL_TABLET | Freq: Two times a day (BID) | ORAL | Status: DC | PRN

## 2012-04-18 NOTE — Telephone Encounter (Signed)
Faxed to pharmacy

## 2012-04-18 NOTE — Telephone Encounter (Signed)
Pt left a message requesting a refill on her Diazepam? Last RX was wrote on 03-08-12 quantity 30 with 0 refills. Please advise?  If ok fax to [phone]

## 2012-04-20 ENCOUNTER — Ambulatory Visit: Payer: Self-pay | Admitting: Family Medicine

## 2012-04-23 ENCOUNTER — Telehealth: Payer: Self-pay | Admitting: Family Medicine

## 2012-04-23 NOTE — Telephone Encounter (Signed)
Please advise oxy refill? Can hold till tomorrow at OR.

## 2012-04-23 NOTE — Telephone Encounter (Signed)
Ok to print Oxycodone rx at OR on 3/26. Same sig, same number, same strength

## 2012-04-24 ENCOUNTER — Other Ambulatory Visit (INDEPENDENT_AMBULATORY_CARE_PROVIDER_SITE_OTHER): Payer: BC Managed Care – PPO

## 2012-04-24 ENCOUNTER — Telehealth: Payer: Self-pay

## 2012-04-24 ENCOUNTER — Ambulatory Visit (INDEPENDENT_AMBULATORY_CARE_PROVIDER_SITE_OTHER): Payer: BC Managed Care – PPO

## 2012-04-24 VITALS — BP 126/85 | HR 71

## 2012-04-24 DIAGNOSIS — I10 Essential (primary) hypertension: Secondary | ICD-10-CM

## 2012-04-24 DIAGNOSIS — F329 Major depressive disorder, single episode, unspecified: Secondary | ICD-10-CM

## 2012-04-24 LAB — RENAL FUNCTION PANEL
BUN: 12 mg/dL (ref 6–23)
CO2: 27 mEq/L (ref 19–32)
Calcium: 9.3 mg/dL (ref 8.4–10.5)
Creatinine, Ser: 0.8 mg/dL (ref 0.4–1.2)

## 2012-04-24 MED ORDER — VENLAFAXINE HCL ER 150 MG PO CP24: 150.0000 mg | ORAL_CAPSULE | Freq: Every day | ORAL | Status: DC

## 2012-04-24 MED ORDER — OXYCODONE HCL 10 MG PO TABS: 10.0000 mg | ORAL_TABLET | ORAL | Status: DC | PRN

## 2012-04-24 NOTE — Progress Notes (Signed)
  Subjective:    Patient ID: Rebecca Boyle, female    DOB: [birth year], 42 y.o.   MRN: [id number]  HPI    Review of Systems     Objective:   Physical Exam        Assessment & Plan:  Patient came in today for a BP check.

## 2012-04-24 NOTE — Progress Notes (Signed)
Labs only

## 2012-04-24 NOTE — Telephone Encounter (Signed)
RX sent to pharmacy  

## 2012-05-02 ENCOUNTER — Telehealth: Payer: Self-pay | Admitting: Family Medicine

## 2012-05-02 NOTE — Telephone Encounter (Signed)
I have no objection to her having more Diazepam same sig, same # and 1 rf. My concern is her BP needs to be rechecked and her kidney function checked due to what she has been telling us re her bp. Needs appt in late April or early May

## 2012-05-03 MED ORDER — DIAZEPAM 10 MG PO TABS: 10.0000 mg | ORAL_TABLET | Freq: Two times a day (BID) | ORAL | Status: DC | PRN

## 2012-05-03 NOTE — Telephone Encounter (Signed)
RX faxed and detailed message on vm

## 2012-05-22 ENCOUNTER — Other Ambulatory Visit: Payer: Self-pay

## 2012-05-22 MED ORDER — OXYCODONE HCL 10 MG PO TABS: 10.0000 mg | ORAL_TABLET | ORAL | Status: DC | PRN

## 2012-05-22 NOTE — Telephone Encounter (Signed)
Patient called stating that she would like MD to refill her pain meds before she leaves today because she won't have transportation to [facility] office.  Last RX was wrote on 04-24-12 quantity 180 with 0 refills

## 2012-05-22 NOTE — Telephone Encounter (Signed)
Per md ok to refill   Pt informed RX is ready to be picked up at OR

## 2012-06-03 ENCOUNTER — Telehealth: Payer: Self-pay | Admitting: Family Medicine

## 2012-06-03 MED ORDER — METOPROLOL SUCCINATE ER 100 MG PO TB24: 100.0000 mg | ORAL_TABLET | Freq: Every day | ORAL | Status: DC

## 2012-06-03 NOTE — Telephone Encounter (Signed)
Refill- metoprolol succinate 100mg . Take one tablet by mouth every day. Qty 30 last fill 3.26.14

## 2012-06-04 ENCOUNTER — Telehealth: Payer: Self-pay | Admitting: Family Medicine

## 2012-06-04 NOTE — Telephone Encounter (Signed)
Please advise Valium refill? Last refill 05/03/12 #30 x1 refill. Patient last seen on 03/08/12.

## 2012-06-04 NOTE — Telephone Encounter (Signed)
She should have a refill for May so unless something has changed she should have a refill

## 2012-06-04 NOTE — Telephone Encounter (Signed)
diazepam (VALIUM) 10 MG tablet    #30

## 2012-06-05 NOTE — Telephone Encounter (Signed)
Left a detailed message on patients vm 

## 2012-06-18 ENCOUNTER — Other Ambulatory Visit: Payer: Self-pay

## 2012-06-18 MED ORDER — OXYCODONE HCL 10 MG PO TABS: 10.0000 mg | ORAL_TABLET | ORAL | Status: DC | PRN

## 2012-06-18 MED ORDER — DIAZEPAM 10 MG PO TABS: 10.0000 mg | ORAL_TABLET | Freq: Two times a day (BID) | ORAL | Status: DC | PRN

## 2012-06-18 NOTE — Telephone Encounter (Signed)
Please advise Diazepam and Oxycodone refills? Pt stated on vm that her and her daughter were going out of town for 2 weeks on Thursday  Last Diazepam RX was on 05-03-12 quantity 30 with 1 refill  Last Oxycodone RX was on 05-22-12 quantity 180 with 0 refills

## 2012-06-18 NOTE — Telephone Encounter (Signed)
RX in front cabinet

## 2012-06-28 ENCOUNTER — Other Ambulatory Visit: Payer: BC Managed Care – PPO

## 2012-07-05 ENCOUNTER — Ambulatory Visit (INDEPENDENT_AMBULATORY_CARE_PROVIDER_SITE_OTHER): Payer: BC Managed Care – PPO | Admitting: Family Medicine

## 2012-07-05 ENCOUNTER — Encounter: Payer: BC Managed Care – PPO | Admitting: Family Medicine

## 2012-07-05 ENCOUNTER — Encounter: Payer: Self-pay | Admitting: Family Medicine

## 2012-07-05 VITALS — BP 120/90 | HR 79 | Temp 98.0°F | Ht 65.5 in | Wt 167.1 lb

## 2012-07-05 DIAGNOSIS — R000 Tachycardia, unspecified: Secondary | ICD-10-CM

## 2012-07-05 DIAGNOSIS — F17200 Nicotine dependence, unspecified, uncomplicated: Secondary | ICD-10-CM

## 2012-07-05 DIAGNOSIS — Z0000 Encounter for general adult medical examination without abnormal findings: Secondary | ICD-10-CM

## 2012-07-05 DIAGNOSIS — G43909 Migraine, unspecified, not intractable, without status migrainosus: Secondary | ICD-10-CM

## 2012-07-05 DIAGNOSIS — F419 Anxiety disorder, unspecified: Secondary | ICD-10-CM

## 2012-07-05 DIAGNOSIS — I10 Essential (primary) hypertension: Secondary | ICD-10-CM

## 2012-07-05 DIAGNOSIS — M329 Systemic lupus erythematosus, unspecified: Secondary | ICD-10-CM

## 2012-07-05 DIAGNOSIS — F329 Major depressive disorder, single episode, unspecified: Secondary | ICD-10-CM

## 2012-07-05 DIAGNOSIS — F341 Dysthymic disorder: Secondary | ICD-10-CM

## 2012-07-05 DIAGNOSIS — R52 Pain, unspecified: Secondary | ICD-10-CM

## 2012-07-05 DIAGNOSIS — N390 Urinary tract infection, site not specified: Secondary | ICD-10-CM

## 2012-07-05 MED ORDER — SUMATRIPTAN SUCCINATE 50 MG PO TABS: 50.0000 mg | ORAL_TABLET | ORAL | Status: DC | PRN

## 2012-07-05 MED ORDER — OXYCODONE HCL 10 MG PO TABS: 10.0000 mg | ORAL_TABLET | ORAL | Status: DC | PRN

## 2012-07-05 MED ORDER — VENLAFAXINE HCL ER 225 MG PO TB24: 1.0000 | ORAL_TABLET | Freq: Every day | ORAL | Status: DC

## 2012-07-05 MED ORDER — PROBIOTIC PRODUCT PO CHEW: CHEWABLE_TABLET | ORAL | Status: DC

## 2012-07-05 MED ORDER — LISINOPRIL 10 MG PO TABS: 10.0000 mg | ORAL_TABLET | Freq: Two times a day (BID) | ORAL | Status: DC

## 2012-07-05 NOTE — Progress Notes (Signed)
Patient ID: Rebecca Boyle, female   DOB: [birth year], 42 y.o.   MRN: [id number] Rebecca Boyle [id number] [birth year] 07/05/2012      Progress Note-Follow Up  Subjective  Chief Complaint  Chief Complaint  Patient presents with  . Annual Exam    physical    HPI   Is a 42 year old Caucasian female in today for annual exam. Doing well except for her chronic pain. She continues to follow with dermatology and they have had to stop her Plaquenil due to an adverse reaction. She is still struggling with the stress of caring for her daughter who struggles with depression and her mother just had a recent vertebral fracture. No recent illness chest pain or palpitations. No shortness or breath GI or GU complaints. Unfortunately she continues to smoke 2 cigarettes daily Past Medical History  Diagnosis Date  . Arthritis     hands, hips, knees, back feet  . Depression   . Lupus 2004  . PONV (postoperative nausea and vomiting)   . Syncope, cardiogenic 2002    treated with toprol  . Anxiety   . Heart murmur   . Chicken pox as a child  . Hypertension   . Tachycardia 06/29/2011  . Anxiety and depression 01/04/2011  . Headache disorder 05/16/2011  . Pain, dental 07/19/2011  . Pedal edema 08/14/2011  . Sinusitis acute 08/29/2011  . Knee pain, right 11/12/2011  . UTI (lower urinary tract infection) 11/27/2011    Past Surgical History  Procedure Laterality Date  . Abdominal hysterectomy  2010    had uterus left ovary removed 2010, right ovary removed 8/12  . Cholecystectomy  2005  . Breast surgery  1995    lumpectomy of left breast- benign  . Right wrist nerve repair - 2008  2008    fell on nail, repair    Family History  Problem Relation Age of Onset  . Hyperlipidemia Mother   . Hypertension Mother   . Stroke Mother     X 5 mini  . Cancer Mother 65    cervical  . Heart disease Mother   . Hypertension Father   . Heart disease Father     cad with stent  . Arthritis Father     2  blown discs  . Stroke Maternal Grandmother   . Lupus Maternal Grandmother   . Heart disease Paternal Grandfather     History   Social History  . Marital Status: Legally Separated    Spouse Name: N/A    Number of Children: N/A  . Years of Education: college   Occupational History  . Not on file.   Social History Main Topics  . Smoking status: Current Some Day Smoker -- 0.25 packs/day for 10 years    Types: Cigarettes  . Smokeless tobacco: Never Used  . Alcohol Use: No  . Drug Use: No  . Sexually Active: No   Other Topics Concern  . 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    Smokes 2 cigarettes a day   Denies ETOH    Current Outpatient Prescriptions on File Prior to Visit  Medication Sig Dispense Refill  . albuterol (PROVENTIL HFA;VENTOLIN HFA) 108 (90 BASE) MCG/ACT inhaler Inhale 2 puffs into the lungs every 4 (four) hours as needed for wheezing. For exercise  induced asthma  1 Inhaler  5  . diazepam (VALIUM) 10 MG tablet Take 1 tablet (10 mg total) by mouth every 12 (twelve) hours as needed. For anxiety  30 tablet  1  . esomeprazole (NEXIUM) 40 MG capsule Take 1 capsule (40 mg total) by mouth daily before breakfast.  30 capsule  2  . Estradiol (ESTROGEL TD) Place 1 application onto the skin every evening.       . hydroxychloroquine (PLAQUENIL) 200 MG tablet Take 200 mg by mouth daily.       Marland Kitchen ibuprofen (ADVIL,MOTRIN) 200 MG tablet Take 400 mg by mouth every 6 (six) hours as needed. For pain.      . metoprolol succinate (TOPROL-XL) 100 MG 24 hr tablet Take 1 tablet (100 mg total) by mouth daily.  30 tablet  4  . Multiple Vitamin (MULITIVITAMIN WITH MINERALS) TABS Take 1 tablet by mouth daily.      . ondansetron (ZOFRAN) 4 MG tablet Take 1 tablet (4 mg total) by mouth every 8 (eight) hours as needed for nausea.  12 tablet  0   . POTASSIUM CITRATE PO Take 1 tablet by mouth daily.      . vitamin C (ASCORBIC ACID) 500 MG tablet Take 500 mg by mouth daily.       No current facility-administered medications on file prior to visit.    Allergies  Allergen Reactions  . Morphine And Related Shortness Of Breath and Itching  . Codeine Itching and Nausea And Vomiting    Review of Systems  Review of Systems  Constitutional: Negative for fever and malaise/fatigue.  HENT: Negative for congestion.   Eyes: Negative for discharge.  Respiratory: Negative for shortness of breath.   Cardiovascular: Negative for chest pain, palpitations and leg swelling.  Gastrointestinal: Negative for nausea, abdominal pain and diarrhea.  Genitourinary: Negative for dysuria.  Musculoskeletal: Positive for back pain. Negative for falls.  Skin: Negative for rash.  Neurological: Negative for loss of consciousness and headaches.  Endo/Heme/Allergies: Negative for polydipsia.  Psychiatric/Behavioral: Positive for depression. Negative for suicidal ideas. The patient is nervous/anxious. The patient does not have insomnia.     Objective  BP 120/90  Pulse 79  Temp(Src) 98 F (36.7 C) (Oral)  Ht 5' 5.5" (1.664 m)  Wt 167 lb 1.3 oz (75.787 kg)  BMI 27.37 kg/m2  SpO2 96%  Physical Exam  Physical Exam  Constitutional: She is oriented to person, place, and time and well-developed, well-nourished, and in no distress. No distress.  HENT:  Head: Normocephalic and atraumatic.  Eyes: Conjunctivae are normal.  Neck: Neck supple. No thyromegaly present.  Cardiovascular: Normal rate, regular rhythm and normal heart sounds.   No murmur heard. Pulmonary/Chest: Effort normal and breath sounds normal. She has no wheezes.  Abdominal: She exhibits no distension and no mass.  Musculoskeletal: She exhibits no edema.  Lymphadenopathy:    She has no cervical adenopathy.  Neurological: She is alert and oriented to person, place, and time.  Skin: Skin  is warm and dry. No rash noted. She is not diaphoretic.  Psychiatric: Memory, affect and judgment normal.    Lab Results  Component Value Date   TSH 0.44 06/29/2011   Lab Results  Component Value Date   WBC 10.1 11/18/2011   HGB 15.6* 11/18/2011   HCT 43.8 11/18/2011   MCV 89.8 11/18/2011   PLT 200 11/18/2011   Lab Results  Component Value Date   CREATININE 0.8 04/24/2012   BUN 12 04/24/2012  NA 138 04/24/2012   K 3.9 04/24/2012   CL 101 04/24/2012   CO2 27 04/24/2012   Lab Results  Component Value Date   ALT 56* 06/29/2011   AST 35 06/29/2011   ALKPHOS 82 06/29/2011   BILITOT 0.3 06/29/2011   Lab Results  Component Value Date   CHOL 205* 06/29/2011   Lab Results  Component Value Date   HDL 47.30 06/29/2011   No results found for this basename: [facility]   Lab Results  Component Value Date   TRIG 111.0 06/29/2011   Lab Results  Component Value Date   CHOLHDL 4 06/29/2011     Assessment & Plan  SLE (systemic lupus erythematosus) Has had to place Plaquenil on hold. Is struggling with increased pain. Allowed refill on Oxycodone. Encouraged ongoing exercise and try and increase.  HTN (hypertension) Well controlled, no changes  Tachycardia Well controlled, no changes  Tobacco use disorder 2 cigarettes daily, enocuraged complete cessation.  Preventative health care Encouraged DASH diet, exercise as tolerated, annual labs, adequate sleep

## 2012-07-07 ENCOUNTER — Encounter: Payer: Self-pay | Admitting: Family Medicine

## 2012-07-07 DIAGNOSIS — Z0000 Encounter for general adult medical examination without abnormal findings: Secondary | ICD-10-CM | POA: Insufficient documentation

## 2012-07-07 DIAGNOSIS — F17200 Nicotine dependence, unspecified, uncomplicated: Secondary | ICD-10-CM | POA: Insufficient documentation

## 2012-07-07 NOTE — Assessment & Plan Note (Signed)
Has had to place Plaquenil on hold. Is struggling with increased pain. Allowed refill on Oxycodone. Encouraged ongoing exercise and try and increase.

## 2012-07-07 NOTE — Assessment & Plan Note (Signed)
Well controlled, no changes 

## 2012-07-07 NOTE — Assessment & Plan Note (Signed)
Encouraged DASH diet, exercise as tolerated, annual labs, adequate sleep

## 2012-07-07 NOTE — Assessment & Plan Note (Signed)
2 cigarettes daily, enocuraged complete cessation.

## 2012-07-19 ENCOUNTER — Telehealth: Payer: Self-pay | Admitting: Family Medicine

## 2012-07-19 NOTE — Telephone Encounter (Signed)
Last RX was wrote 06-18-12 quantity 30 with 1 refill.  To early for refill

## 2012-07-19 NOTE — Telephone Encounter (Signed)
Refill-diazepam 10mg  tablet. Take one tablet by mouth every 12 hours as needed for anxiety. Last fill 6.2.14

## 2012-07-30 ENCOUNTER — Telehealth: Payer: Self-pay | Admitting: Family Medicine

## 2012-07-30 NOTE — Telephone Encounter (Signed)
Patient called in wanting to know why we denied her refill on diazepam, I informed her per phone note that it was too early to fill. She states that the rx is always written for a 15 day supply and that she is already out of refills.

## 2012-07-30 NOTE — Telephone Encounter (Signed)
So this is always tricky. Yes in a normal month i am not expecting her to take 2 Diazepam a day. She needs to remember Diazepam is written as needed. My hope is she does not need 2 a day but in a crisis she could have 2 on a given day but would hopefully not need 2 every day so that hopefully 30 would last her at least a month. Have her explain whys she has needed 2 daily and warn her that if this use needs to continue she will need to come in to dicuss options and have the baseline amount increased then go ahead and let her pu the refill for now

## 2012-07-30 NOTE — Telephone Encounter (Signed)
Please advise if you are wanting #30 to be monthly supply [Sig: Take 1 tablet (10 mg total) by mouth every 12 (twelve) hours as needed. For anxiety]/SLS

## 2012-07-31 MED ORDER — DIAZEPAM 10 MG PO TABS: 10.0000 mg | ORAL_TABLET | Freq: Two times a day (BID) | ORAL | Status: DC | PRN

## 2012-07-31 NOTE — Telephone Encounter (Signed)
THX!

## 2012-07-31 NOTE — Telephone Encounter (Signed)
FYI: Pt left a vm stating that when she first starting seeing MD she was taking 3 or 4 Ativan and she asked MD what she should take to decrease this amount and MD stated she could take 1 Valium in the morning and 1 at bedtime? Pt stated that she has never been told by MD that she should only take 1 daily unless she absolutely had to take more? Pt stated if she knew this she would have only taken 1. Pt also stated that she doesn't want an increase.

## 2012-07-31 NOTE — Telephone Encounter (Signed)
Left a detailed vm and asked pt to return my call.  RX printed and put on mds desk

## 2012-08-03 ENCOUNTER — Observation Stay (HOSPITAL_BASED_OUTPATIENT_CLINIC_OR_DEPARTMENT_OTHER)
Admission: EM | Admit: 2012-08-03 | Discharge: 2012-08-04 | Disposition: A | Discharge status: Home / Self Care | Payer: BC Managed Care – PPO | Attending: Internal Medicine | Admitting: Internal Medicine

## 2012-08-03 ENCOUNTER — Emergency Department (HOSPITAL_BASED_OUTPATIENT_CLINIC_OR_DEPARTMENT_OTHER): Payer: BC Managed Care – PPO

## 2012-08-03 ENCOUNTER — Encounter (HOSPITAL_BASED_OUTPATIENT_CLINIC_OR_DEPARTMENT_OTHER): Payer: Self-pay | Admitting: *Deleted

## 2012-08-03 DIAGNOSIS — F341 Dysthymic disorder: Secondary | ICD-10-CM

## 2012-08-03 DIAGNOSIS — I10 Essential (primary) hypertension: Secondary | ICD-10-CM | POA: Diagnosis present

## 2012-08-03 DIAGNOSIS — G8929 Other chronic pain: Secondary | ICD-10-CM | POA: Diagnosis present

## 2012-08-03 DIAGNOSIS — F17200 Nicotine dependence, unspecified, uncomplicated: Secondary | ICD-10-CM

## 2012-08-03 DIAGNOSIS — F419 Anxiety disorder, unspecified: Secondary | ICD-10-CM

## 2012-08-03 DIAGNOSIS — R079 Chest pain, unspecified: Principal | ICD-10-CM | POA: Diagnosis present

## 2012-08-03 DIAGNOSIS — M329 Systemic lupus erythematosus, unspecified: Secondary | ICD-10-CM | POA: Diagnosis present

## 2012-08-03 LAB — BASIC METABOLIC PANEL
BUN: 13 mg/dL (ref 6–23)
Chloride: 103 mEq/L (ref 96–112)
Creatinine, Ser: 0.7 mg/dL (ref 0.50–1.10)
GFR calc non Af Amer: >90 mL/min (ref >90)
Glucose, Bld: 125 mg/dL — ABNORMAL HIGH (ref 70–99)
Potassium: 3.6 mEq/L (ref 3.5–5.1)

## 2012-08-03 LAB — CBC WITH DIFFERENTIAL/PLATELET
Basophils Absolute: 0 10*3/uL (ref 0.0–0.1)
Basophils Relative: 0 % (ref 0–1)
Eosinophils Relative: 1 % (ref 0–5)
HCT: 38.3 % (ref 36.0–46.0)
Hemoglobin: 13.6 g/dL (ref 12.0–15.0)
MCH: 32.5 pg (ref 26.0–34.0)
MCHC: 35.5 g/dL (ref 30.0–36.0)
MCV: 91.4 fL (ref 78.0–100.0)
Monocytes Absolute: 0.7 10*3/uL (ref 0.1–1.0)
Monocytes Relative: 9 % (ref 3–12)
RDW: 12.5 % (ref 11.5–15.5)

## 2012-08-03 MED ORDER — ASPIRIN 81 MG PO CHEW: 324.0000 mg | CHEWABLE_TABLET | Freq: Once | ORAL | Status: DC

## 2012-08-03 MED ORDER — HYDROMORPHONE HCL PF 1 MG/ML IJ SOLN
0.5000 mg | Freq: Once | INTRAMUSCULAR | Status: AC
  Administered 2012-08-03: 0.5 mg via INTRAVENOUS
  Filled 2012-08-03: qty 1

## 2012-08-03 MED ORDER — LORAZEPAM 2 MG/ML IJ SOLN
0.5000 mg | Freq: Once | INTRAMUSCULAR | Status: AC
  Administered 2012-08-03: 0.5 mg via INTRAVENOUS
  Filled 2012-08-03: qty 1

## 2012-08-03 MED ORDER — FENTANYL CITRATE 0.05 MG/ML IJ SOLN
50.0000 ug | Freq: Once | INTRAMUSCULAR | Status: AC
  Administered 2012-08-03: 50 ug via INTRAVENOUS
  Filled 2012-08-03: qty 2

## 2012-08-03 MED ORDER — NITROGLYCERIN 2 % TD OINT: 1.0000 [in_us] | TOPICAL_OINTMENT | Freq: Once | TRANSDERMAL | Status: DC

## 2012-08-03 MED ORDER — SODIUM CHLORIDE 0.9 % IV SOLN
Freq: Once | INTRAVENOUS | Status: AC
  Administered 2012-08-03: 21:00:00 via INTRAVENOUS

## 2012-08-03 MED ORDER — ONDANSETRON 4 MG PO TBDP
ORAL_TABLET | ORAL | Status: AC
  Administered 2012-08-03: 4 mg via ORAL
  Filled 2012-08-03: qty 1

## 2012-08-03 MED ORDER — ONDANSETRON 4 MG PO TBDP: 4.0000 mg | ORAL_TABLET | Freq: Once | ORAL | Status: AC

## 2012-08-03 MED ORDER — NITROGLYCERIN 0.4 MG SL SUBL
0.4000 mg | SUBLINGUAL_TABLET | SUBLINGUAL | Status: DC | PRN
Start: 2012-08-03 — End: 2012-08-04
  Administered 2012-08-03 – 2012-08-04 (×2): 0.4 mg via SUBLINGUAL
  Filled 2012-08-03 (×2): qty 25

## 2012-08-03 NOTE — ED Provider Notes (Signed)
History    CSN: [id number] Arrival date & time 08/03/12  1740  First MD Initiated Contact with Patient 08/03/12 1800     Chief Complaint  Patient presents with  . Hypertension   (Consider location/radiation/quality/duration/timing/severity/associated sxs/prior Treatment) Patient is a 42 y.o. female presenting with hypertension. The history is provided by the patient. No language interpreter was used.  Hypertension This is a chronic problem. Associated symptoms include nausea. Pertinent negatives include no chills, fever, vomiting or weakness. Associated symptoms comments: She has a history of hypertension with intermittent cycles where it becomes difficult to control. She has seen her doctor on a regular basis and medications are adjusted as needed. She feels she has been under significant stress and has a history of lupus, which also affects her pressure. Today, she is having chest pressure, shortness of breath and nausea, which is typical of when her blood pressure is elevated. She has taken additional medications (additional 50 mg of Metoprolol and 10 mg Lisinopril)..   Past Medical History  Diagnosis Date  . Arthritis     hands, hips, knees, back feet  . Depression   . Lupus 2004  . PONV (postoperative nausea and vomiting)   . Syncope, cardiogenic 2002    treated with toprol  . Anxiety   . Heart murmur   . Chicken pox as a child  . Hypertension   . Tachycardia 06/29/2011  . Anxiety and depression 01/04/2011  . Headache disorder 05/16/2011  . Pain, dental 07/19/2011  . Pedal edema 08/14/2011  . Sinusitis acute 08/29/2011  . Knee pain, right 11/12/2011  . UTI (lower urinary tract infection) 11/27/2011  . Preventative health care 07/07/2012   Past Surgical History  Procedure Laterality Date  . Abdominal hysterectomy  2010    had uterus left ovary removed 2010, right ovary removed 8/12  . Cholecystectomy  2005  . Breast surgery  1995    lumpectomy of left breast- benign  .  Right wrist nerve repair - 2008  2008    fell on nail, repair   Family History  Problem Relation Age of Onset  . Hyperlipidemia Mother   . Hypertension Mother   . Stroke Mother     X 5 mini  . Cancer Mother 45    cervical  . Heart disease Mother   . Hypertension Father   . Heart disease Father     cad with stent  . Arthritis Father     2 blown discs  . Stroke Maternal Grandmother   . Lupus Maternal Grandmother   . Heart disease Paternal Grandfather    History  Substance Use Topics  . Smoking status: Current Some Day Smoker -- 0.25 packs/day for 10 years    Types: Cigarettes  . Smokeless tobacco: Never Used  . Alcohol Use: No   OB History   Grav Para Term Preterm Abortions TAB SAB Ect Mult Living                 Review of Systems  Constitutional: Negative for fever and chills.  HENT: Negative.   Eyes: Negative for visual disturbance.  Respiratory: Positive for chest tightness and shortness of breath.   Cardiovascular: Negative.  Negative for leg swelling.  Gastrointestinal: Positive for nausea. Negative for vomiting.  Musculoskeletal: Negative.   Neurological: Negative.  Negative for weakness and light-headedness.  Psychiatric/Behavioral: Positive for dysphoric mood.    Allergies  Morphine and related and Codeine  Home Medications   Current Outpatient Rx  Name  Route  Sig  Dispense  Refill  . albuterol (PROVENTIL HFA;VENTOLIN HFA) 108 (90 BASE) MCG/ACT inhaler   Inhalation   Inhale 2 puffs into the lungs every 4 (four) hours as needed for wheezing. For exercise induced asthma   1 Inhaler   5   . diazepam (VALIUM) 10 MG tablet   Oral   Take 1 tablet (10 mg total) by mouth every 12 (twelve) hours as needed. For anxiety   30 tablet   0   . esomeprazole (NEXIUM) 40 MG capsule   Oral   Take 1 capsule (40 mg total) by mouth daily before breakfast.   30 capsule   2   . Estradiol (ESTROGEL TD)   Transdermal   Place 1 application onto the skin every  evening.          . hydroxychloroquine (PLAQUENIL) 200 MG tablet   Oral   Take 200 mg by mouth daily.          Marland Kitchen ibuprofen (ADVIL,MOTRIN) 200 MG tablet   Oral   Take 400 mg by mouth every 6 (six) hours as needed. For pain.         Marland Kitchen lisinopril (PRINIVIL,ZESTRIL) 10 MG tablet   Oral   Take 1 tablet (10 mg total) by mouth 2 (two) times daily.   60 tablet   3   . metoprolol succinate (TOPROL-XL) 100 MG 24 hr tablet   Oral   Take 1 tablet (100 mg total) by mouth daily.   30 tablet   4   . Multiple Vitamin (MULITIVITAMIN WITH MINERALS) TABS   Oral   Take 1 tablet by mouth daily.         . ondansetron (ZOFRAN) 4 MG tablet   Oral   Take 1 tablet (4 mg total) by mouth every 8 (eight) hours as needed for nausea.   12 tablet   0   . Oxycodone HCl 10 MG TABS   Oral   Take 1 tablet (10 mg total) by mouth every 3 (three) hours as needed.   210 tablet   0   . POTASSIUM CITRATE PO   Oral   Take 1 tablet by mouth daily.         . Probiotic Product (MISC INTESTINAL FLORA REGULAT) CHEW      Digestive Health probiotic gummies by Schiff daily   60 tablet   5   . SUMAtriptan (IMITREX) 50 MG tablet   Oral   Take 1 tablet (50 mg total) by mouth every 2 (two) hours as needed for migraine.   9 tablet   2   . Venlafaxine HCl 225 MG TB24   Oral   Take 1 tablet (225 mg total) by mouth daily.   30 each   3   . vitamin C (ASCORBIC ACID) 500 MG tablet   Oral   Take 500 mg by mouth daily.          BP 152/110  Pulse 98  Temp(Src) 97.7 F (36.5 C) (Oral)  Resp 20  Ht 5\' 6"  (1.676 m)  Wt 158 lb (71.668 kg)  BMI 25.51 kg/m2  SpO2 100% Physical Exam  Constitutional: She is oriented to person, place, and time. She appears well-developed and well-nourished.  HENT:  Head: Normocephalic.  Neck: Normal range of motion. Neck supple.  Cardiovascular: Normal rate and regular rhythm.   Pulmonary/Chest: Effort normal and breath sounds normal.  Abdominal: Soft. Bowel  sounds are normal. There is no tenderness. There  is no rebound and no guarding.  Musculoskeletal: Normal range of motion. She exhibits no edema.  Neurological: She is alert and oriented to person, place, and time.  Skin: Skin is warm and dry. No rash noted.  Psychiatric: She has a normal mood and affect.    ED Course  Procedures (including critical care time) Labs Reviewed  CBC WITH DIFFERENTIAL  BASIC METABOLIC PANEL  TROPONIN I   Results for orders placed during the hospital encounter of 08/03/12  CBC WITH DIFFERENTIAL      Result Value Range   WBC 8.0  4.0 - 10.5 K/uL   RBC 4.19  3.87 - 5.11 MIL/uL   Hemoglobin 13.6  12.0 - 15.0 g/dL   HCT 45.4  09.8 - 11.9 %   MCV 91.4  78.0 - 100.0 fL   MCH 32.5  26.0 - 34.0 pg   MCHC 35.5  30.0 - 36.0 g/dL   RDW 14.7  82.9 - 56.2 %   Platelets 204  150 - 400 K/uL   Neutrophils Relative % 57  43 - 77 %   Neutro Abs 4.5  1.7 - 7.7 K/uL   Lymphocytes Relative 34  12 - 46 %   Lymphs Abs 2.7  0.7 - 4.0 K/uL   Monocytes Relative 9  3 - 12 %   Monocytes Absolute 0.7  0.1 - 1.0 K/uL   Eosinophils Relative 1  0 - 5 %   Eosinophils Absolute 0.0  0.0 - 0.7 K/uL   Basophils Relative 0  0 - 1 %   Basophils Absolute 0.0  0.0 - 0.1 K/uL  BASIC METABOLIC PANEL      Result Value Range   Sodium 140  135 - 145 mEq/L   Potassium 3.6  3.5 - 5.1 mEq/L   Chloride 103  96 - 112 mEq/L   CO2 26  19 - 32 mEq/L   Glucose, Bld 125 (*) 70 - 99 mg/dL   BUN 13  6 - 23 mg/dL   Creatinine, Ser 1.30  0.50 - 1.10 mg/dL   Calcium 9.7  8.4 - 86.5 mg/dL   GFR calc non Af Amer >90  >90 mL/min   GFR calc Af Amer >90  >90 mL/min  TROPONIN I      Result Value Range   Troponin I <0.30  <0.30 ng/mL   Dg Chest Portable 1 View  08/03/2012   *RADIOLOGY REPORT*  Clinical Data: Hypertension.  Coughing with the deep breaths.  PORTABLE CHEST - 1 VIEW  Comparison: 03/05/2009  Findings: Cardiac and mediastinal contours appear normal.  The lungs appear clear.  No pleural effusion  is identified.  IMPRESSION:  No significant abnormality identified.   Original Report Authenticated By: Gaylyn Rong, M.D.   Date: 08/03/2012  Rate: 91  Rhythm: normal sinus rhythm  QRS Axis: normal  Intervals: normal  ST/T Wave abnormalities: normal  Conduction Disutrbances:none  Narrative Interpretation:   Old EKG Reviewed: unchanged   No results found. No diagnosis found. 1. Chest pain 2. Hypertension  MDM  Discomfort is persistent/constant, but associated with SOB, diaphoresis, nausea, in the setting of uncontrolled htn - may represent unstable angina. Chest pressure is resolved with one SL NTG. Blood pressure significantly improved. She reports pain in joints (knees, back) typical of "lupus pain", but no ongoing chest pain.   Discussed with [facility] - Dr. Onalee Hua - who accepts patient for admission and transfer to [facility].   Arnoldo Hooker, PA-C 08/03/12 2104  Arnoldo Hooker, PA-C 08/03/12 2154

## 2012-08-03 NOTE — ED Notes (Addendum)
Pt states she is an [employer] and knows when her BP is up. This a.m. had H/A and dizziness. Neighbor a paramedic and took her BP and it was elevated. Spoke with Dr. on call and they told her to call 911. Also c/o chest heaviness and diaphoresis. Took 150 mg of Metoprolol XR this a.m. And 20 mg of Lisinopril. Taken to ED3. EKG being done.

## 2012-08-03 NOTE — ED Provider Notes (Signed)
Medical screening examination/treatment/procedure(s) were conducted as a shared visit with non-physician practitioner(s) and myself.  I personally evaluated the patient during the encounter  Patient here with chest pain that is made worse with her blood pressure goes up. Cardiac enzymes are negative.. Current EKG is nonacute. She was admitted for cardiac evaluation  Toy Baker, MD 08/03/12 2042

## 2012-08-03 NOTE — ED Notes (Signed)
PA back at bedside for reassessment 

## 2012-08-04 DIAGNOSIS — R079 Chest pain, unspecified: Secondary | ICD-10-CM | POA: Diagnosis present

## 2012-08-04 DIAGNOSIS — I369 Nonrheumatic tricuspid valve disorder, unspecified: Secondary | ICD-10-CM

## 2012-08-04 LAB — CBC
Platelets: 193 10*3/uL (ref 150–400)
RDW: 13 % (ref 11.5–15.5)
WBC: 7.5 10*3/uL (ref 4.0–10.5)

## 2012-08-04 LAB — BASIC METABOLIC PANEL
Calcium: 8.3 mg/dL — ABNORMAL LOW (ref 8.4–10.5)
Chloride: 107 mEq/L (ref 96–112)
Creatinine, Ser: 0.66 mg/dL (ref 0.50–1.10)
GFR calc Af Amer: >90 mL/min (ref >90)

## 2012-08-04 LAB — SEDIMENTATION RATE: Sed Rate: 20 mm/hr (ref 0–22)

## 2012-08-04 LAB — TROPONIN I: Troponin I: <0.3 ng/mL (ref <0.30)

## 2012-08-04 MED ORDER — LISINOPRIL 10 MG PO TABS
10.0000 mg | ORAL_TABLET | Freq: Two times a day (BID) | ORAL | Status: DC
  Administered 2012-08-04 (×2): 10 mg via ORAL
  Filled 2012-08-04 (×3): qty 1

## 2012-08-04 MED ORDER — HYDRALAZINE HCL 20 MG/ML IJ SOLN: 10.0000 mg | Freq: Four times a day (QID) | INTRAMUSCULAR | Status: DC | PRN

## 2012-08-04 MED ORDER — SODIUM CHLORIDE 0.9 % IJ SOLN
3.0000 mL | Freq: Two times a day (BID) | INTRAMUSCULAR | Status: DC
  Administered 2012-08-04: 3 mL via INTRAVENOUS

## 2012-08-04 MED ORDER — ONDANSETRON HCL 4 MG/2ML IJ SOLN: 4.0000 mg | Freq: Four times a day (QID) | INTRAMUSCULAR | Status: DC | PRN

## 2012-08-04 MED ORDER — NITROGLYCERIN 2 % TD OINT: 0.5000 [in_us] | TOPICAL_OINTMENT | Freq: Once | TRANSDERMAL | Status: DC

## 2012-08-04 MED ORDER — OXYCODONE HCL 5 MG PO TABS
10.0000 mg | ORAL_TABLET | ORAL | Status: DC | PRN
  Administered 2012-08-04: 10 mg via ORAL
  Filled 2012-08-04: qty 2

## 2012-08-04 MED ORDER — GI COCKTAIL ~~LOC~~
30.0000 mL | Freq: Once | ORAL | Status: AC
  Administered 2012-08-04: 30 mL via ORAL
  Filled 2012-08-04: qty 30

## 2012-08-04 MED ORDER — POTASSIUM CHLORIDE CRYS ER 10 MEQ PO TBCR
10.0000 meq | EXTENDED_RELEASE_TABLET | Freq: Once | ORAL | Status: AC
  Administered 2012-08-04: 10 meq via ORAL
  Filled 2012-08-04: qty 1

## 2012-08-04 MED ORDER — HYDROMORPHONE HCL PF 1 MG/ML IJ SOLN
INTRAMUSCULAR | Status: AC
  Filled 2012-08-04: qty 1

## 2012-08-04 MED ORDER — ENOXAPARIN SODIUM 40 MG/0.4ML ~~LOC~~ SOLN
40.0000 mg | SUBCUTANEOUS | Status: DC
  Filled 2012-08-04: qty 0.4

## 2012-08-04 MED ORDER — NITROGLYCERIN 2 % TD OINT
0.5000 [in_us] | TOPICAL_OINTMENT | Freq: Three times a day (TID) | TRANSDERMAL | Status: DC
  Filled 2012-08-04: qty 30

## 2012-08-04 MED ORDER — POTASSIUM CHLORIDE ER 10 MEQ PO TBCR: 10.0000 meq | EXTENDED_RELEASE_TABLET | Freq: Every day | ORAL | Status: DC

## 2012-08-04 MED ORDER — SODIUM CHLORIDE 0.9 % IV SOLN: 250.0000 mL | INTRAVENOUS | Status: DC | PRN

## 2012-08-04 MED ORDER — ALUM & MAG HYDROXIDE-SIMETH 200-200-20 MG/5ML PO SUSP: 30.0000 mL | Freq: Four times a day (QID) | ORAL | Status: DC | PRN

## 2012-08-04 MED ORDER — METOPROLOL SUCCINATE ER 100 MG PO TB24
100.0000 mg | ORAL_TABLET | Freq: Every day | ORAL | Status: DC
  Administered 2012-08-04: 100 mg via ORAL
  Filled 2012-08-04: qty 1

## 2012-08-04 MED ORDER — PANTOPRAZOLE SODIUM 40 MG PO TBEC: 40.0000 mg | DELAYED_RELEASE_TABLET | Freq: Every day | ORAL | Status: DC

## 2012-08-04 MED ORDER — ASPIRIN EC 81 MG PO TBEC
81.0000 mg | DELAYED_RELEASE_TABLET | Freq: Every day | ORAL | Status: DC
  Administered 2012-08-04: 81 mg via ORAL
  Filled 2012-08-04: qty 1

## 2012-08-04 MED ORDER — SODIUM CHLORIDE 0.9 % IJ SOLN: 3.0000 mL | INTRAMUSCULAR | Status: DC | PRN

## 2012-08-04 MED ORDER — ONDANSETRON HCL 4 MG PO TABS: 4.0000 mg | ORAL_TABLET | Freq: Four times a day (QID) | ORAL | Status: DC | PRN

## 2012-08-04 MED ORDER — DIAZEPAM 5 MG PO TABS
10.0000 mg | ORAL_TABLET | Freq: Two times a day (BID) | ORAL | Status: DC | PRN
  Administered 2012-08-04 (×2): 10 mg via ORAL
  Filled 2012-08-04 (×2): qty 2

## 2012-08-04 MED ORDER — HYDROCHLOROTHIAZIDE 12.5 MG PO CAPS
12.5000 mg | ORAL_CAPSULE | Freq: Every day | ORAL | Status: DC
  Administered 2012-08-04: 12.5 mg via ORAL
  Filled 2012-08-04: qty 1

## 2012-08-04 MED ORDER — HYDROMORPHONE HCL PF 1 MG/ML IJ SOLN
1.0000 mg | Freq: Once | INTRAMUSCULAR | Status: AC
  Administered 2012-08-04: 1 mg via INTRAVENOUS

## 2012-08-04 MED ORDER — HYDROCHLOROTHIAZIDE 12.5 MG PO CAPS: 12.5000 mg | ORAL_CAPSULE | Freq: Every day | ORAL | Status: DC

## 2012-08-04 MED ORDER — HYDROMORPHONE HCL PF 1 MG/ML IJ SOLN
1.0000 mg | INTRAMUSCULAR | Status: DC | PRN
  Administered 2012-08-04: 1 mg via INTRAVENOUS
  Filled 2012-08-04: qty 1

## 2012-08-04 NOTE — Progress Notes (Signed)
Pt refuses to take nitro paste after sl nitro, pt states pain is 0. bp 131/92, called md made aware. Orders given.

## 2012-08-04 NOTE — Progress Notes (Signed)
  Echocardiogram 2D Echocardiogram has been performed.  Georgian Co 08/04/2012, 10:53 AM

## 2012-08-04 NOTE — H&P (Signed)
PCP:   Danise Edge, MD   Chief Complaint: Chest pain  HPI: 42 yo female h/o lupus, uncont htn, chronic pain transferred from [place] for chest pressure that has been occurring for several hours now.  Associated mild sob.  No fevers.  No cough.  No n/v.  No abd pain.  Not similar to her normal heartburn.  She was given ntg sl x 1 at urgent care with markedly relief of chest pressure but not completely relieved per pt report.  She has no prior CAD or h/o stress testing.  No le edema or swelling.  Her bp has improved since arrival.  Now on tele floor pt is still reporting chest pressure 3/10, but improved from initial arrival to [facility] center.  Nitropaste was previously ordered per my request with edp, but has not been placed yet.    Review of Systems:  Positive and negative as per HPI otherwise all other systems are negative  Past Medical History: Past Medical History  Diagnosis Date  . Arthritis     hands, hips, knees, back feet  . Depression   . Lupus 2004  . PONV (postoperative nausea and vomiting)   . Syncope, cardiogenic 2002    treated with toprol  . Anxiety   . Heart murmur   . Chicken pox as a child  . Hypertension   . Tachycardia 06/29/2011  . Anxiety and depression 01/04/2011  . Headache disorder 05/16/2011  . Pain, dental 07/19/2011  . Pedal edema 08/14/2011  . Sinusitis acute 08/29/2011  . Knee pain, right 11/12/2011  . UTI (lower urinary tract infection) 11/27/2011  . Preventative health care 07/07/2012   Past Surgical History  Procedure Laterality Date  . Abdominal hysterectomy  2010    had uterus left ovary removed 2010, right ovary removed 8/12  . Cholecystectomy  2005  . Breast surgery  1995    lumpectomy of left breast- benign  . Right wrist nerve repair - 2008  2008    fell on nail, repair    Medications: Prior to Admission medications   Medication Sig Start Date End Date Taking? Authorizing Provider  albuterol (PROVENTIL HFA;VENTOLIN HFA)  108 (90 BASE) MCG/ACT inhaler Inhale 2 puffs into the lungs every 4 (four) hours as needed for wheezing. For exercise induced asthma 09/13/11   Bradd Canary, MD  diazepam (VALIUM) 10 MG tablet Take 1 tablet (10 mg total) by mouth every 12 (twelve) hours as needed. For anxiety 07/31/12   Bradd Canary, MD  esomeprazole (NEXIUM) 40 MG capsule Take 1 capsule (40 mg total) by mouth daily before breakfast. 01/03/11   Sandford Craze, NP  Estradiol (ESTROGEL TD) Place 1 application onto the skin every evening.     Historical Provider, MD  hydroxychloroquine (PLAQUENIL) 200 MG tablet Take 200 mg by mouth daily.     Historical Provider, MD  ibuprofen (ADVIL,MOTRIN) 200 MG tablet Take 400 mg by mouth every 6 (six) hours as needed. For pain.    Historical Provider, MD  lisinopril (PRINIVIL,ZESTRIL) 10 MG tablet Take 1 tablet (10 mg total) by mouth 2 (two) times daily. 07/05/12   Bradd Canary, MD  metoprolol succinate (TOPROL-XL) 100 MG 24 hr tablet Take 1 tablet (100 mg total) by mouth daily. 06/03/12   Bradd Canary, MD  Multiple Vitamin (MULITIVITAMIN WITH MINERALS) TABS Take 1 tablet by mouth daily.    Historical Provider, MD  ondansetron (ZOFRAN) 4 MG tablet Take 1 tablet (4 mg total)  by mouth every 8 (eight) hours as needed for nausea. 11/18/11   Daleen Bo, MD  Oxycodone HCl 10 MG TABS Take 1 tablet (10 mg total) by mouth every 3 (three) hours as needed. 07/05/12   Bradd Canary, MD  POTASSIUM CITRATE PO Take 1 tablet by mouth daily.    Historical Provider, MD  Probiotic Product (MISC INTESTINAL FLORA REGULAT) CHEW Digestive Health probiotic gummies by Schiff daily 07/05/12   Bradd Canary, MD  SUMAtriptan (IMITREX) 50 MG tablet Take 1 tablet (50 mg total) by mouth every 2 (two) hours as needed for migraine. 07/05/12   Bradd Canary, MD  Venlafaxine HCl 225 MG TB24 Take 150 mg by mouth daily. 07/05/12   Bradd Canary, MD  vitamin C (ASCORBIC ACID) 500 MG tablet Take 500 mg by mouth daily.    Historical  Provider, MD    Allergies:   Allergies  Allergen Reactions  . Morphine And Related Shortness Of Breath and Itching  . Tylenol (Acetaminophen) Rash    Pt states she has liver damage, and is also taking plaquenil  . Codeine Itching and Nausea And Vomiting    Social History:  reports that she has been smoking Cigarettes.  She has a 2.5 pack-year smoking history. She has never used smokeless tobacco. She reports that she does not drink alcohol or use illicit drugs.  Family History: Family History  Problem Relation Age of Onset  . Hyperlipidemia Mother   . Hypertension Mother   . Stroke Mother     X 5 mini  . Cancer Mother 65    cervical  . Heart disease Mother   . Hypertension Father   . Heart disease Father     cad with stent  . Arthritis Father     2 blown discs  . Stroke Maternal Grandmother   . Lupus Maternal Grandmother   . Heart disease Paternal Grandfather     Physical Exam: Filed Vitals:   08/03/12 2010 08/03/12 2112 08/03/12 2241 08/03/12 2335  BP: 141/105 127/74 122/82 144/91  Pulse: 79 89 77 68  Temp:    98.3 F (36.8 C)  TempSrc:    Oral  Resp: 18 20 11 18   Height:      Weight:    77.61 kg (171 lb 1.6 oz)  SpO2: 100% 98% 94% 95%   General appearance: alert, cooperative and no distress Head: Normocephalic, without obvious abnormality, atraumatic Nose: Nares normal. Septum midline. Mucosa normal. No drainage or sinus tenderness. Neck: no JVD and supple, symmetrical, trachea midline Lungs: clear to auscultation bilaterally Heart: regular rate and rhythm, S1, S2 normal, no murmur, click, rub or gallop Abdomen: soft, non-tender; bowel sounds normal; no masses,  no organomegaly Extremities: extremities normal, atraumatic, no cyanosis or edema Pulses: 2+ and symmetric Skin: Skin color, texture, turgor normal. No rashes or lesions Neurologic: Grossly normal    Labs on Admission:   Recent Labs  08/03/12 1915  NA 140  K 3.6  CL 103  CO2 26   GLUCOSE 125*  BUN 13  CREATININE 0.70  CALCIUM 9.7    Recent Labs  08/03/12 1915  WBC 8.0  NEUTROABS 4.5  HGB 13.6  HCT 38.3  MCV 91.4  PLT 204    Recent Labs  08/03/12 1915  TROPONINI <0.30   Radiological Exams on Admission: Dg Chest Portable 1 View  08/03/2012   *RADIOLOGY REPORT*  Clinical Data: Hypertension.  Coughing with the deep breaths.  PORTABLE CHEST -  1 VIEW  Comparison: 03/05/2009  Findings: Cardiac and mediastinal contours appear normal.  The lungs appear clear.  No pleural effusion is identified.  IMPRESSION:  No significant abnormality identified.   Original Report Authenticated By: Gaylyn Rong, M.D.    Assessment/Plan  41 yo female with atypical chest pressure with h/o lupus, uncontrolled htn Principal Problem:   Chest pain Active Problems:   SLE (systemic lupus erythematosus)   Chronic pain   HTN (hypertension)  Will give sl ntg now.  Then place nitro paste.  Romi.  Will also check echo in am, place on tele.  Asa.  bblocker with prn hydralazine.  Repeat ekg now.  Provide dilaudid.  Will also ck sed rate, although initial ekg nsr with no acute changes or changes c/w pericarditis.  Will repeat 12 lead.  Also provide gi cocktail.    Loann Chahal A 08/04/2012, 12:00 AM

## 2012-08-04 NOTE — ED Provider Notes (Signed)
Medical screening examination/treatment/procedure(s) were performed by non-physician practitioner and as supervising physician I was immediately available for consultation/collaboration.  Apolo Cutshaw T Reinhardt Licausi, MD 08/04/12 2038 

## 2012-08-05 ENCOUNTER — Telehealth: Payer: Self-pay | Admitting: Family Medicine

## 2012-08-05 ENCOUNTER — Other Ambulatory Visit: Payer: Self-pay | Admitting: Family Medicine

## 2012-08-05 DIAGNOSIS — R52 Pain, unspecified: Secondary | ICD-10-CM

## 2012-08-05 MED ORDER — OXYCODONE HCL 10 MG PO TABS: 10.0000 mg | ORAL_TABLET | ORAL | Status: DC | PRN

## 2012-08-05 NOTE — Telephone Encounter (Signed)
Patient allowed refill on her pain medications

## 2012-08-05 NOTE — Telephone Encounter (Signed)
NEEDS TO TALK TO Rebecca Boyle ABOUT WHEN TO SCHEDULE HOSPITAL FOLLOW UP.  PRIOR TO SEEING CARDIOLOGY OR AFTER.  ALSO HER BP IS GOING BACK UP.  SHE LEFT A LONG MESSAGE ON PHONE FOR Rebecca Boyle

## 2012-08-05 NOTE — Telephone Encounter (Signed)
Please advise?  Pt did also leave me a message stating that [facility] do to her BP going up, sweating and chest heaviness? Pt stated in vm that she was put on HCTZ 12.5 mg and Potassium Supplement. Pt also stated the hospital gave her pain medication because they were closely watching her Lupus?  Pt would like to know if she can have a refill on her pain medication? Last RX was wrote on 07-05-12 quantity 210 with 0 refills  Please advise both questions?

## 2012-08-05 NOTE — Progress Notes (Signed)
Patient needs Oxycodone, not hydrocodone, refill given today

## 2012-08-05 NOTE — Telephone Encounter (Signed)
[name] got RX from Dr Abner Greenspan and gave to pt

## 2012-08-05 NOTE — Telephone Encounter (Signed)
She can have a refill on her hydrocodone and we should see her next week with bp check and we will do labs to check her potassium

## 2012-08-09 ENCOUNTER — Ambulatory Visit: Payer: BC Managed Care – PPO | Admitting: Nurse Practitioner

## 2012-08-09 DIAGNOSIS — Z0289 Encounter for other administrative examinations: Secondary | ICD-10-CM

## 2012-08-13 NOTE — Discharge Summary (Signed)
Physician Discharge Summary  Rebecca Boyle ZOX:[id number] DOB: Aug 04, 1970 DOA: 08/03/2012  PCP: Danise Edge, MD  Admit date: 08/03/2012 Discharge date: 08/13/2012  Time spent: 40 minutes  Recommendations for Outpatient Follow-up:  1. PCP in 1 week  Discharge Diagnoses:  Principal Problem:   Atypical Chest pain Active Problems:   SLE (systemic lupus erythematosus)   Chronic pain   HTN (hypertension)   Discharge Condition: stable  Diet recommendation: low sodium  Filed Weights   08/03/12 1801 08/03/12 2335  Weight: 71.668 kg (158 lb) 77.61 kg (171 lb 1.6 oz)    History of present illness:  42 yo female h/o lupus, uncont htn, chronic pain [place] for chest pressure that has been occurring for several hours now. Associated mild sob. No fevers. No cough. No n/v. No abd pain. Not similar to her normal heartburn. She was given ntg sl x 1 at urgent care with markedly relief of chest pressure but not completely relieved per pt report. She has no prior CAD  Hospital Course:  She was admitted with very atypical chest, neck and back pain and pressure. She ruled out for MI based on EKG and 3 sets of cardiac enzymes, no events on Telemetry. Suspect that her symptoms were musculoskeletal in origin versus  GERD from NSAID use, which she is advised to cut down and use PPI daily and not PRN. She was also advised to stop Triptan use. Medically stable at discharge and advised t FU with PCP in 1 week.  Procedures: 2D ECHO Study Conclusions - Left ventricle: The cavity size was normal. Wall thickness was normal. Systolic function was normal. The estimated ejection fraction was in the range of 55% to 60%. - Atrial septum: No defect or patent foramen ovale was identified.     Discharge Exam: Filed Vitals:   08/03/12 2112 08/03/12 2241 08/03/12 2335 08/04/12 0524  BP: 127/74 122/82 144/91 134/96  Pulse: 89 77 68 66  Temp:   97.8 F (36.6 C) 97.9 F  (36.6 C)  TempSrc:   Oral Oral  Resp: 20 11 18 18   Height:      Weight:   77.61 kg (171 lb 1.6 oz)   SpO2: 98% 94% 95% 98%    General:AAOx3 Cardiovascular:S1S2/RRR Respiratory: CTAB  Discharge Instructions  Discharge Orders   Future Orders Complete By Expires     Diet - low sodium heart healthy  As directed     Increase activity slowly  As directed         Medication List    STOP taking these medications       ibuprofen 200 MG tablet  Commonly known as:  ADVIL,MOTRIN     SUMAtriptan 50 MG tablet  Commonly known as:  IMITREX      TAKE these medications       albuterol 108 (90 BASE) MCG/ACT inhaler  Commonly known as:  PROVENTIL HFA;VENTOLIN HFA  Inhale 2 puffs into the lungs every 4 (four) hours as needed for wheezing. For exercise induced asthma     diazepam 10 MG tablet  Commonly known as:  VALIUM  Take 1 tablet (10 mg total) by mouth every 12 (twelve) hours as needed. For anxiety     esomeprazole 40 MG capsule  Commonly known as:  NEXIUM  Take 1 capsule (40 mg total) by mouth daily before breakfast.     ESTROGEL TD  Place 1 application onto the skin every evening.     hydrochlorothiazide 12.5  MG capsule  Commonly known as:  MICROZIDE  Take 1 capsule (12.5 mg total) by mouth daily.     lisinopril 10 MG tablet  Commonly known as:  PRINIVIL,ZESTRIL  Take 1 tablet (10 mg total) by mouth 2 (two) times daily.     metoprolol succinate 100 MG 24 hr tablet  Commonly known as:  TOPROL-XL  Take 1 tablet (100 mg total) by mouth daily.     Misc Intestinal Flora Regulat Chew  Digestive Health probiotic gummies by Schiff daily     multivitamin with minerals Tabs  Take 1 tablet by mouth daily.     potassium chloride 10 MEQ tablet  Commonly known as:  K-DUR  Take 1 tablet (10 mEq total) by mouth daily.     Venlafaxine HCl 225 MG Tb24  Take 150 mg by mouth daily.     vitamin C 500 MG tablet  Commonly known as:  ASCORBIC ACID  Take 500 mg by mouth daily.        Allergies  Allergen Reactions  . Morphine And Related Shortness Of Breath and Itching  . Tylenol (Acetaminophen) Rash    Pt states she has liver damage, and is also taking plaquenil  . Codeine Itching and Nausea And Vomiting       Follow-up Information   Follow up with Danise Edge, MD In 1 week.   Contact information:   [address] [place] [address] [phone]        The results of significant diagnostics from this hospitalization (including imaging, microbiology, ancillary and laboratory) are listed below for reference.    Significant Diagnostic Studies: Dg Chest Portable 1 View  08/03/2012   *RADIOLOGY REPORT*  Clinical Data: Hypertension.  Coughing with the deep breaths.  PORTABLE CHEST - 1 VIEW  Comparison: 03/05/2009  Findings: Cardiac and mediastinal contours appear normal.  The lungs appear clear.  No pleural effusion is identified.  IMPRESSION:  No significant abnormality identified.   Original Report Authenticated By: Gaylyn Rong, M.D.    Microbiology: No results found for this or any previous visit (from the past 240 hour(s)).   Labs: Basic Metabolic Panel: No results found for this basename: NA, K, CL, CO2, GLUCOSE, BUN, CREATININE, CALCIUM, MG, PHOS,  in the last 168 hours Liver Function Tests: No results found for this basename: AST, ALT, ALKPHOS, BILITOT, PROT, ALBUMIN,  in the last 168 hours No results found for this basename: LIPASE, AMYLASE,  in the last 168 hours No results found for this basename: AMMONIA,  in the last 168 hours CBC: No results found for this basename: WBC, NEUTROABS, HGB, HCT, MCV, PLT,  in the last 168 hours Cardiac Enzymes: No results found for this basename: CKTOTAL, CKMB, CKMBINDEX, TROPONINI,  in the last 168 hours BNP: BNP (last 3 results) No results found for this basename: PROBNP,  in the last 8760 hours CBG: No results found for this basename: GLUCAP,  in the last 168  hours     Signed:  Eli Adami  [facility] 08/13/2012, 3:33 PM

## 2012-08-21 ENCOUNTER — Telehealth: Payer: Self-pay

## 2012-08-21 NOTE — Telephone Encounter (Signed)
FYI: Pt left a message stating she was in the hospital a week before last for unstable angina and High BP. Pt states she has been ok but today started getting heaviness in chest- no chest pressure.  Pt stated the 3D echo showed leakage in heart.   I spoke to patient to inform her that MD is out of the office this afternoon and she stated she is coming in to see [place] tomorrow at 10. I did inform pt that if heaviness gets worse, chest pressure starts, or any other symptoms/concerns come about to go directly to ED or call 911. Pt voiced understanding.

## 2012-08-22 ENCOUNTER — Ambulatory Visit (INDEPENDENT_AMBULATORY_CARE_PROVIDER_SITE_OTHER): Payer: BC Managed Care – PPO | Admitting: Physician Assistant

## 2012-08-22 ENCOUNTER — Encounter: Payer: Self-pay | Admitting: Physician Assistant

## 2012-08-22 ENCOUNTER — Ambulatory Visit: Payer: BC Managed Care – PPO | Admitting: Physician Assistant

## 2012-08-22 VITALS — BP 133/93 | HR 87 | Temp 98.4°F | Resp 16 | Wt 172.0 lb

## 2012-08-22 DIAGNOSIS — R079 Chest pain, unspecified: Secondary | ICD-10-CM

## 2012-08-22 DIAGNOSIS — Z90710 Acquired absence of both cervix and uterus: Secondary | ICD-10-CM

## 2012-08-22 DIAGNOSIS — R899 Unspecified abnormal finding in specimens from other organs, systems and tissues: Secondary | ICD-10-CM

## 2012-08-22 DIAGNOSIS — R6889 Other general symptoms and signs: Secondary | ICD-10-CM

## 2012-08-22 DIAGNOSIS — E8940 Asymptomatic postprocedural ovarian failure: Secondary | ICD-10-CM

## 2012-08-22 MED ORDER — ESTRADIOL 0.75 MG/1.25 GM (0.06%) TD GEL: 1.2500 g | Freq: Every day | TRANSDERMAL | Status: DC

## 2012-08-22 NOTE — Assessment & Plan Note (Signed)
Cardiac workup negative.  No chest pain at time of interview.  BP good at time of visit.  Patient to continue BP medication as prescribed.  If patient develops symptoms of high BP or if chest pain develops, return to ED. Otherwise will follow-up with PCP Dr. Abner Greenspan in 1-3 weeks.

## 2012-08-22 NOTE — Progress Notes (Signed)
Patient ID: Rebecca Boyle, female   DOB: 01/22/1971, 42 y.o.   MRN: [id number]  Patient presents to clinic today for ED follow-up.  Patient was experiencing chest pressure and noted BP was highly elevated so she proceeded to the ED.  Was seen and evaluated with cardiac workup including 2D Echo.  Workup was negative.  Since discharge patient has had intermittent sensation of chest pressure, mostly when she lays down at night.  Denies overt chest pain, but does note some SOB, again only when lying flat. Patient denies N/V.  Denies fever or recent illness. Denies history of HF, GERD.  Does note that she has felt fatigues since visit to hospital.  Patient has been taking her BP medications as prescribed and has her BP checked daily.  Sees her PCP, Dr. Abner Greenspan who manages her medications.  Patient endorses smoking 2 cigarettes/day.  Patient is also wishing for a refill of her Estrogel to help with symptoms of fatigue.  Patient is s/p hysterectomy and oophorectomy.  Denies H/A, hx of MI, TIA, or CVA.   Past Medical History  Diagnosis Date  . Arthritis     hands, hips, knees, back feet  . Depression   . Lupus 2004  . PONV (postoperative nausea and vomiting)   . Syncope, cardiogenic 2002    treated with toprol  . Anxiety   . Heart murmur   . Chicken pox as a child  . Hypertension   . Tachycardia 06/29/2011  . Anxiety and depression 01/04/2011  . Headache disorder 05/16/2011  . Pain, dental 07/19/2011  . Pedal edema 08/14/2011  . Sinusitis acute 08/29/2011  . Knee pain, right 11/12/2011  . UTI (lower urinary tract infection) 11/27/2011  . Preventative health care 07/07/2012   Current Outpatient Prescriptions on File Prior to Visit  Medication Sig Dispense Refill  . albuterol (PROVENTIL HFA;VENTOLIN HFA) 108 (90 BASE) MCG/ACT inhaler Inhale 2 puffs into the lungs every 4 (four) hours as needed for wheezing. For exercise induced asthma  1 Inhaler  5  . diazepam (VALIUM) 10 MG tablet Take 1 tablet (10  mg total) by mouth every 12 (twelve) hours as needed. For anxiety  30 tablet  0  . esomeprazole (NEXIUM) 40 MG capsule Take 1 capsule (40 mg total) by mouth daily before breakfast.  30 capsule  2  . hydrochlorothiazide (MICROZIDE) 12.5 MG capsule Take 1 capsule (12.5 mg total) by mouth daily.  30 capsule  0  . lisinopril (PRINIVIL,ZESTRIL) 10 MG tablet Take 1 tablet (10 mg total) by mouth 2 (two) times daily.  60 tablet  3  . metoprolol succinate (TOPROL-XL) 100 MG 24 hr tablet Take 1 tablet (100 mg total) by mouth daily.  30 tablet  4  . Multiple Vitamin (MULITIVITAMIN WITH MINERALS) TABS Take 1 tablet by mouth daily.      . Oxycodone HCl 10 MG TABS Take 1 tablet (10 mg total) by mouth every 3 (three) hours as needed.  210 tablet  0  . potassium chloride (K-DUR) 10 MEQ tablet Take 1 tablet (10 mEq total) by mouth daily.  30 tablet  0  . Probiotic Product (MISC INTESTINAL FLORA REGULAT) CHEW Digestive Health probiotic gummies by Schiff daily  60 tablet  5  . Venlafaxine HCl 225 MG TB24 Take 150 mg by mouth daily.      . vitamin C (ASCORBIC ACID) 500 MG tablet Take 500 mg by mouth daily.       No current facility-administered medications on file  prior to visit.   Allergies  Allergen Reactions  . Morphine And Related Shortness Of Breath and Itching  . Tylenol (Acetaminophen) Rash    Pt states she has liver damage, and is also taking plaquenil  . Codeine Itching and Nausea And Vomiting   Review of Systems  Constitutional: Positive for malaise/fatigue. Negative for fever, chills and weight loss.  HENT: Negative for hearing loss.   Eyes: Negative for blurred vision, double vision, photophobia and pain.  Respiratory: Positive for cough. Negative for hemoptysis, sputum production and wheezing.        Occasional SOB when lying flat.  Cardiovascular: Negative for chest pain and palpitations.  Gastrointestinal: Negative for heartburn, nausea, vomiting, abdominal pain, diarrhea and constipation.   Musculoskeletal: Negative for myalgias.  Neurological: Negative for dizziness, sensory change, speech change, focal weakness, loss of consciousness and headaches.   Physical Exam  Constitutional: She is oriented to person, place, and time and well-developed, well-nourished, and in no distress. No distress.  Patient sitting comfortably on examination table.  HENT:  Head: Normocephalic and atraumatic.  Right Ear: External ear normal.  Left Ear: External ear normal.  Mouth/Throat: Oropharynx is clear and moist.  Eyes: Conjunctivae are normal. Pupils are equal, round, and reactive to light.  Neck: Normal range of motion. Neck supple.  Cardiovascular: Normal rate, regular rhythm and normal heart sounds.   Pulmonary/Chest: Effort normal and breath sounds normal. No respiratory distress. She has no wheezes. She has no rales.  Abdominal: Soft. Bowel sounds are normal. She exhibits no distension. There is no tenderness.  Lymphadenopathy:    She has no cervical adenopathy.  Neurological: She is alert and oriented to person, place, and time.  Skin: Skin is warm and dry. She is not diaphoretic.   Labs: Recent Results (from the past 2160 hour(s))  CBC WITH DIFFERENTIAL     Status: None   Collection Time    08/03/12  7:15 PM      Result Value Range   WBC 8.0  4.0 - 10.5 K/uL   RBC 4.19  3.87 - 5.11 MIL/uL   Hemoglobin 13.6  12.0 - 15.0 g/dL   HCT 16.1  09.6 - 04.5 %   MCV 91.4  78.0 - 100.0 fL   MCH 32.5  26.0 - 34.0 pg   MCHC 35.5  30.0 - 36.0 g/dL   RDW 40.9  81.1 - 91.4 %   Platelets 204  150 - 400 K/uL   Neutrophils Relative % 57  43 - 77 %   Neutro Abs 4.5  1.7 - 7.7 K/uL   Lymphocytes Relative 34  12 - 46 %   Lymphs Abs 2.7  0.7 - 4.0 K/uL   Monocytes Relative 9  3 - 12 %   Monocytes Absolute 0.7  0.1 - 1.0 K/uL   Eosinophils Relative 1  0 - 5 %   Eosinophils Absolute 0.0  0.0 - 0.7 K/uL   Basophils Relative 0  0 - 1 %   Basophils Absolute 0.0  0.0 - 0.1 K/uL  BASIC METABOLIC  PANEL     Status: Abnormal   Collection Time    08/03/12  7:15 PM      Result Value Range   Sodium 140  135 - 145 mEq/L   Potassium 3.6  3.5 - 5.1 mEq/L   Chloride 103  96 - 112 mEq/L   CO2 26  19 - 32 mEq/L   Glucose, Bld 125 (*) 70 - 99 mg/dL  BUN 13  6 - 23 mg/dL   Creatinine, Ser 9.60  0.50 - 1.10 mg/dL   Calcium 9.7  8.4 - 45.4 mg/dL   GFR calc non Af Amer >90  >90 mL/min   GFR calc Af Amer >90  >90 mL/min   Comment:            The eGFR has been calculated     using the CKD EPI equation.     This calculation has not been     validated in all clinical     situations.     eGFR's persistently     <90 mL/min signify     possible Chronic Kidney Disease.  TROPONIN I     Status: None   Collection Time    08/03/12  7:15 PM      Result Value Range   Troponin I <0.30  <0.30 ng/mL   Comment:            Due to the release kinetics of cTnI,     a negative result within the first hours     of the onset of symptoms does not rule out     myocardial infarction with certainty.     If myocardial infarction is still suspected,     repeat the test at appropriate intervals.  BASIC METABOLIC PANEL     Status: Abnormal   Collection Time    08/04/12  1:40 AM      Result Value Range   Sodium 141  135 - 145 mEq/L   Potassium 3.4 (*) 3.5 - 5.1 mEq/L   Chloride 107  96 - 112 mEq/L   CO2 27  19 - 32 mEq/L   Glucose, Bld 109 (*) 70 - 99 mg/dL   BUN 14  6 - 23 mg/dL   Creatinine, Ser 0.98  0.50 - 1.10 mg/dL   Calcium 8.3 (*) 8.4 - 10.5 mg/dL   GFR calc non Af Amer >90  >90 mL/min   GFR calc Af Amer >90  >90 mL/min   Comment:            The eGFR has been calculated     using the CKD EPI equation.     This calculation has not been     validated in all clinical     situations.     eGFR's persistently     <90 mL/min signify     possible Chronic Kidney Disease.  CBC     Status: Abnormal   Collection Time    08/04/12  1:40 AM      Result Value Range   WBC 7.5  4.0 - 10.5 K/uL   RBC  3.77 (*) 3.87 - 5.11 MIL/uL   Hemoglobin 11.7 (*) 12.0 - 15.0 g/dL   HCT 11.9 (*) 14.7 - 82.9 %   MCV 91.8  78.0 - 100.0 fL   MCH 31.0  26.0 - 34.0 pg   MCHC 33.8  30.0 - 36.0 g/dL   RDW 56.2  13.0 - 86.5 %   Platelets 193  150 - 400 K/uL  SEDIMENTATION RATE     Status: None   Collection Time    08/04/12  1:40 AM      Result Value Range   Sed Rate 20  0 - 22 mm/hr  TROPONIN I     Status: None   Collection Time    08/04/12  1:40 AM      Result Value Range   Troponin  I <0.30  <0.30 ng/mL   Comment:            Due to the release kinetics of cTnI,     a negative result within the first hours     of the onset of symptoms does not rule out     myocardial infarction with certainty.     If myocardial infarction is still suspected,     repeat the test at appropriate intervals.   Assessment/Plan: (1) Chest Pressure -- Cardiac workup negative.  Patient to continue BP medication as prescribed.  If patient develops symptoms of high BP or if chest pain develops, return to ED. Otherwise will follow-up with PCP Dr. Abner Greenspan in 1-3 weeks.  (2) Will refill patient's prescription for Estradiol.  Spoke with Dr. Abner Greenspan who is agreeable to this.  Follow-up with Abner Greenspan in 1-3 weeks.  (3) Labs -- Due to persistent fatigue and recent abnormal lab values, plan is to obtain the following: CBC, Iron Studies, BMP, PTH, Vitamin D level.

## 2012-08-22 NOTE — Assessment & Plan Note (Signed)
Due to persistent fatigue and recent abnormal lab values, plan is to obtain the following: CBC, Iron Studies, BMP, PTH, Vitamin D level.

## 2012-08-22 NOTE — Patient Instructions (Signed)
Glad you are doing better.  Prescription for Estradiol has been refilled.  Please take as prescribed.  Follow-up with Dr. Abner Greenspan in 1-3 weeks.  If symptoms of chest pressure return or worsen, please go to the ED.  Chest Pain (Nonspecific) It is often hard to give a specific diagnosis for the cause of chest pain. There is always a chance that your pain could be related to something serious, such as a heart attack or a blood clot in the lungs. You need to follow up with your caregiver for further evaluation. CAUSES   Heartburn.  Pneumonia or bronchitis.  Anxiety or stress.  Inflammation around your heart (pericarditis) or lung (pleuritis or pleurisy).  A blood clot in the lung.  A collapsed lung (pneumothorax). It can develop suddenly on its own (spontaneous pneumothorax) or from injury (trauma) to the chest.  Shingles infection (herpes zoster virus). The chest wall is composed of bones, muscles, and cartilage. Any of these can be the source of the pain.  The bones can be bruised by injury.  The muscles or cartilage can be strained by coughing or overwork.  The cartilage can be affected by inflammation and become sore (costochondritis). DIAGNOSIS  Lab tests or other studies, such as X-rays, electrocardiography, stress testing, or cardiac imaging, may be needed to find the cause of your pain.  TREATMENT   Treatment depends on what may be causing your chest pain. Treatment may include:  Acid blockers for heartburn.  Anti-inflammatory medicine.  Pain medicine for inflammatory conditions.  Antibiotics if an infection is present.  You may be advised to change lifestyle habits. This includes stopping smoking and avoiding alcohol, caffeine, and chocolate.  You may be advised to keep your head raised (elevated) when sleeping. This reduces the chance of acid going backward from your stomach into your esophagus.  Most of the time, nonspecific chest pain will improve within 2 to 3 days  with rest and mild pain medicine. HOME CARE INSTRUCTIONS   If antibiotics were prescribed, take your antibiotics as directed. Finish them even if you start to feel better.  For the next few days, avoid physical activities that bring on chest pain. Continue physical activities as directed.  Do not smoke.  Avoid drinking alcohol.  Only take over-the-counter or prescription medicine for pain, discomfort, or fever as directed by your caregiver.  Follow your caregiver's suggestions for further testing if your chest pain does not go away.  Keep any follow-up appointments you made. If you do not go to an appointment, you could develop lasting (chronic) problems with pain. If there is any problem keeping an appointment, you must call to reschedule. SEEK MEDICAL CARE IF:   You think you are having problems from the medicine you are taking. Read your medicine instructions carefully.  Your chest pain does not go away, even after treatment.  You develop a rash with blisters on your chest. SEEK IMMEDIATE MEDICAL CARE IF:   You have increased chest pain or pain that spreads to your arm, neck, jaw, back, or abdomen.  You develop shortness of breath, an increasing cough, or you are coughing up blood.  You have severe back or abdominal pain, feel nauseous, or vomit.  You develop severe weakness, fainting, or chills.  You have a fever. THIS IS AN EMERGENCY. Do not wait to see if the pain will go away. Get medical help at once. Call your local emergency services (911 in [place] Do not drive yourself to the hospital. MAKE SURE  YOU:   Understand these instructions.  Will watch your condition.  Will get help right away if you are not doing well or get worse. Document Released: 10/26/2004 Document Revised: 04/10/2011 Document Reviewed: 08/22/2007 [facility] Patient Information 2014 [place], [place].

## 2012-08-22 NOTE — Assessment & Plan Note (Signed)
Will refill patient's prescription for Estradiol.  Spoke with Dr. Abner Greenspan who is agreeable to this.  Follow-up with Abner Greenspan in 1-3 weeks.

## 2012-08-23 ENCOUNTER — Ambulatory Visit: Payer: BC Managed Care – PPO | Admitting: Family Medicine

## 2012-08-23 ENCOUNTER — Other Ambulatory Visit: Payer: Self-pay | Admitting: *Deleted

## 2012-08-23 LAB — BASIC METABOLIC PANEL
CO2: 23 mEq/L (ref 19–32)
Chloride: 106 mEq/L (ref 96–112)
Glucose, Bld: 107 mg/dL — ABNORMAL HIGH (ref 70–99)
Potassium: 4 mEq/L (ref 3.5–5.3)
Sodium: 139 mEq/L (ref 135–145)

## 2012-08-23 LAB — CBC WITH DIFFERENTIAL/PLATELET
Basophils Absolute: 0 10*3/uL (ref 0.0–0.1)
Lymphocytes Relative: 23 % (ref 12–46)
Neutro Abs: 5.5 10*3/uL (ref 1.7–7.7)
Platelets: 238 10*3/uL (ref 150–400)
RDW: 13.3 % (ref 11.5–15.5)
WBC: 7.8 10*3/uL (ref 4.0–10.5)

## 2012-08-23 LAB — IBC PANEL
%SAT: 32 % (ref 20–55)
TIBC: 348 ug/dL (ref 250–470)

## 2012-08-23 LAB — VITAMIN D 25 HYDROXY (VIT D DEFICIENCY, FRACTURES): Vit D, 25-Hydroxy: 27 ng/mL — ABNORMAL LOW (ref 30–89)

## 2012-08-23 MED ORDER — LISINOPRIL 20 MG PO TABS: 10.0000 mg | ORAL_TABLET | Freq: Two times a day (BID) | ORAL | Status: DC

## 2012-08-23 NOTE — Progress Notes (Signed)
Pharmacy request for 20 mg Lisinopril for Insurance purposes; Rx to pharmacy/[facility]

## 2012-08-26 NOTE — Progress Notes (Signed)
Quick Note:  Patient Informed and voiced understanding ______ 

## 2012-08-28 ENCOUNTER — Encounter: Payer: Self-pay | Admitting: Family Medicine

## 2012-08-28 ENCOUNTER — Ambulatory Visit (INDEPENDENT_AMBULATORY_CARE_PROVIDER_SITE_OTHER): Payer: BC Managed Care – PPO | Admitting: Family Medicine

## 2012-08-28 VITALS — BP 112/80 | HR 71 | Temp 98.6°F | Ht 65.5 in | Wt 172.0 lb

## 2012-08-28 DIAGNOSIS — M329 Systemic lupus erythematosus, unspecified: Secondary | ICD-10-CM

## 2012-08-28 DIAGNOSIS — R52 Pain, unspecified: Secondary | ICD-10-CM

## 2012-08-28 DIAGNOSIS — I10 Essential (primary) hypertension: Secondary | ICD-10-CM

## 2012-08-28 DIAGNOSIS — F419 Anxiety disorder, unspecified: Secondary | ICD-10-CM

## 2012-08-28 DIAGNOSIS — M5430 Sciatica, unspecified side: Secondary | ICD-10-CM

## 2012-08-28 DIAGNOSIS — F341 Dysthymic disorder: Secondary | ICD-10-CM

## 2012-08-28 MED ORDER — OXYCODONE HCL 10 MG PO TABS: 10.0000 mg | ORAL_TABLET | ORAL | Status: DC | PRN

## 2012-08-28 MED ORDER — DIAZEPAM 10 MG PO TABS: 10.0000 mg | ORAL_TABLET | Freq: Two times a day (BID) | ORAL | Status: DC | PRN

## 2012-08-28 MED ORDER — LISINOPRIL 10 MG PO TABS: 10.0000 mg | ORAL_TABLET | Freq: Two times a day (BID) | ORAL | Status: DC

## 2012-08-28 MED ORDER — VENLAFAXINE HCL ER 75 MG PO CP24: 225.0000 mg | ORAL_CAPSULE | Freq: Every day | ORAL | Status: DC

## 2012-08-28 MED ORDER — METOPROLOL SUCCINATE ER 100 MG PO TB24: 100.0000 mg | ORAL_TABLET | Freq: Every day | ORAL | Status: DC

## 2012-08-28 NOTE — Assessment & Plan Note (Signed)
Rash on right shoulder after being in sun, try cleansing mild irritation with [facility] Astringent

## 2012-08-28 NOTE — Assessment & Plan Note (Signed)
Good in office today , is seeing 130-140s over 80-90s at home except for last night and she bent over in her kitchen and immediately felt lightheaded and confused. Her family reports she was incoherent temporarily when her blood pressure was 80/44. She had to drink some Gatorade and lie down. Will continue her lisinopril 10 mg twice a day. HCTZ and have her metoprolol to evening to see if that helps. She will report recurrent issues and she may need to return to cardiology for further consideration if this is persistent

## 2012-08-28 NOTE — Assessment & Plan Note (Signed)
Given refill on pain meds today

## 2012-08-28 NOTE — Assessment & Plan Note (Signed)
She feels venlafaxine has been helping she's been under a great deal of stress. One of her daughters has been in the hospital 3 months pregnant but is now back out. She's caring for her ailing daughter and she's had some physical health concerns for now. Now we will continue that and the venlafaxine at 225 mg and reassess at next visit or sooner as needed. May continue to use diazepam daily and a breakthrough dose as needed for now

## 2012-08-28 NOTE — Progress Notes (Signed)
Patient ID: Rebecca Boyle, female   DOB: [birth year], 42 y.o.   MRN: [id number] Rebecca Boyle [id number] 08/01/1970 08/28/2012      Progress Note-Follow Up  Subjective  Chief Complaint  Chief Complaint  Patient presents with  . Follow-up    HPI  Patient is a 42 year old Caucasian female who is in today for followup. She was seen in the hospital last week with hypertensive crisis. Since coming home she's been better but she did have one episode of presyncope last night. HCTZ was added which she has tolerated well. Last night she was in her kitchen and bent over. When she sat up she went depression and started speaking incoherently family grandparents had her down and reportedly her blood pressure was 88/44. The placed in Trendelenburg and gave her some Gatorade and her blood pressure improved as did her mental status. This is the only concerning episodes she's had since been home. She denies any recurrent palpitations although this does occur occasionally. She denies a chest pain, palpitations, shortness of breath headache, GI or GU concerns. No fevers or chills. She has been under a great deal of stress with her daughter 3 months pregnant and in the hospital intermittently as well as inhaling mother and her own health problems. Does feel all in all that Effexor has been helpful.  Past Medical History  Diagnosis Date  . Arthritis     hands, hips, knees, back feet  . Depression   . Lupus 2004  . PONV (postoperative nausea and vomiting)   . Syncope, cardiogenic 2002    treated with toprol  . Anxiety   . Heart murmur   . Chicken pox as a child  . Hypertension   . Tachycardia 06/29/2011  . Anxiety and depression 01/04/2011  . Headache disorder 05/16/2011  . Pain, dental 07/19/2011  . Pedal edema 08/14/2011  . Sinusitis acute 08/29/2011  . Knee pain, right 11/12/2011  . UTI (lower urinary tract infection) 11/27/2011  . Preventative health care 07/07/2012    Past Surgical History   Procedure Laterality Date  . Abdominal hysterectomy  2010    had uterus left ovary removed 2010, right ovary removed 8/12  . Cholecystectomy  2005  . Breast surgery  1995    lumpectomy of left breast- benign  . Right wrist nerve repair - 2008  2008    fell on nail, repair    Family History  Problem Relation Age of Onset  . Hyperlipidemia Mother   . Hypertension Mother   . Stroke Mother     X 5 mini  . Cancer Mother 69    cervical  . Heart disease Mother   . Hypertension Father   . Heart disease Father     cad with stent  . Arthritis Father     2 blown discs  . Stroke Maternal Grandmother   . Lupus Maternal Grandmother   . Heart disease Paternal Grandfather     History   Social History  . Marital Status: Legally Separated    Spouse Name: N/A    Number of Children: N/A  . Years of Education: college   Occupational History  . Not on file.   Social History Main Topics  . Smoking status: Current Some Day Smoker -- 0.25 packs/day for 10 years    Types: Cigarettes  . Smokeless tobacco: Never Used  . Alcohol Use: No  . Drug Use: No  . Sexually Active: No   Other Topics Concern  . 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    Smokes 2 cigarettes a day   Denies ETOH    Current Outpatient Prescriptions on File Prior to Visit  Medication Sig Dispense Refill  . albuterol (PROVENTIL HFA;VENTOLIN HFA) 108 (90 BASE) MCG/ACT inhaler Inhale 2 puffs into the lungs every 4 (four) hours as needed for wheezing. For exercise induced asthma  1 Inhaler  5  . esomeprazole (NEXIUM) 40 MG capsule Take 1 capsule (40 mg total) by mouth daily before breakfast.  30 capsule  2  . Estradiol 0.75 MG/1.25 GM (0.06%) topical gel Place 1.25 g onto the skin daily.  1 Bottle  12  . hydrochlorothiazide (MICROZIDE) 12.5 MG capsule Take 1 capsule  (12.5 mg total) by mouth daily.  30 capsule  0  . Multiple Vitamin (MULITIVITAMIN WITH MINERALS) TABS Take 1 tablet by mouth daily.      . potassium chloride (K-DUR) 10 MEQ tablet Take 1 tablet (10 mEq total) by mouth daily.  30 tablet  0  . Probiotic Product (MISC INTESTINAL FLORA REGULAT) CHEW Digestive Health probiotic gummies by Schiff daily  60 tablet  5  . vitamin C (ASCORBIC ACID) 500 MG tablet Take 500 mg by mouth daily.       No current facility-administered medications on file prior to visit.    Allergies  Allergen Reactions  . Morphine And Related Shortness Of Breath and Itching  . Tylenol (Acetaminophen) Rash    Pt states she has liver damage, and is also taking plaquenil  . Codeine Itching and Nausea And Vomiting    Review of Systems  Review of Systems  Constitutional: Negative for fever and malaise/fatigue.  HENT: Negative for congestion.   Eyes: Negative for pain and discharge.  Respiratory: Negative for shortness of breath.   Cardiovascular: Positive for palpitations. Negative for chest pain and leg swelling.  Gastrointestinal: Negative for nausea, abdominal pain and diarrhea.  Genitourinary: Negative for dysuria.  Musculoskeletal: Positive for back pain and joint pain. Negative for falls.  Skin: Negative for rash.  Neurological: Positive for dizziness and speech change. Negative for loss of consciousness and headaches.  Endo/Heme/Allergies: Negative for polydipsia.  Psychiatric/Behavioral: Positive for depression. Negative for suicidal ideas. The patient is nervous/anxious. The patient does not have insomnia.     Objective  BP 112/80  Pulse 71  Temp(Src) 98.6 F (37 C) (Oral)  Ht 5' 5.5" (1.664 m)  Wt 172 lb (78.019 kg)  BMI 28.18 kg/m2  SpO2 96%  Physical Exam  Physical Exam  Constitutional: She is oriented to person, place, and time and well-developed, well-nourished, and in no distress. No distress.  HENT:  Head: Normocephalic and atraumatic.   Eyes: Conjunctivae are normal.  Neck: Neck supple. No thyromegaly present.  Cardiovascular: Normal rate, regular rhythm and normal heart sounds.   No murmur heard. Pulmonary/Chest: Effort normal and breath sounds normal. She has no wheezes.  Abdominal: She exhibits no distension and no mass.  Musculoskeletal: She exhibits no edema.  Lymphadenopathy:    She has no cervical adenopathy.  Neurological: She is alert and oriented to person, place, and time.  Skin: Skin is warm and dry. Rash noted. She is not diaphoretic. There is erythema.  Right shoulder cluster of maculopapular red bumps  Psychiatric: Memory, affect and judgment normal.  Lab Results  Component Value Date   TSH 0.44 06/29/2011   Lab Results  Component Value Date   WBC 7.8 08/22/2012   HGB 13.0 08/22/2012   HCT 38.4 08/22/2012   MCV 91.0 08/22/2012   PLT 238 08/22/2012   Lab Results  Component Value Date   CREATININE 0.77 08/22/2012   BUN 13 08/22/2012   NA 139 08/22/2012   K 4.0 08/22/2012   CL 106 08/22/2012   CO2 23 08/22/2012   Lab Results  Component Value Date   ALT 56* 06/29/2011   AST 35 06/29/2011   ALKPHOS 82 06/29/2011   BILITOT 0.3 06/29/2011   Lab Results  Component Value Date   CHOL 205* 06/29/2011   Lab Results  Component Value Date   HDL 47.30 06/29/2011   No results found for this basename: [facility]   Lab Results  Component Value Date   TRIG 111.0 06/29/2011   Lab Results  Component Value Date   CHOLHDL 4 06/29/2011     Assessment & Plan  Anxiety and depression She feels venlafaxine has been helping she's been under a great deal of stress. One of her daughters has been in the hospital 3 months pregnant but is now back out. She's caring for her ailing daughter and she's had some physical health concerns for now. Now we will continue that and the venlafaxine at 225 mg and reassess at next visit or sooner as needed. May continue to use diazepam daily and a breakthrough dose as needed for  now  HTN (hypertension) Good in office today , is seeing 130-140s over 80-90s at home except for last night and she bent over in her kitchen and immediately felt lightheaded and confused. Her family reports she was incoherent temporarily when her blood pressure was 80/44. She had to drink some Gatorade and lie down. Will continue her lisinopril 10 mg twice a day. HCTZ and have her metoprolol to evening to see if that helps. She will report recurrent issues and she may need to return to cardiology for further consideration if this is persistent  SLE (systemic lupus erythematosus) Rash on right shoulder after being in sun, try cleansing mild irritation with Witch Hazel Astringent  Sciatic nerve pain Given refill on pain meds today

## 2012-08-28 NOTE — Patient Instructions (Addendum)

## 2012-08-29 ENCOUNTER — Ambulatory Visit: Payer: BC Managed Care – PPO | Admitting: Family Medicine

## 2012-10-01 ENCOUNTER — Telehealth: Payer: Self-pay | Admitting: Family Medicine

## 2012-10-01 ENCOUNTER — Other Ambulatory Visit: Payer: Self-pay

## 2012-10-01 DIAGNOSIS — R52 Pain, unspecified: Secondary | ICD-10-CM

## 2012-10-01 MED ORDER — POTASSIUM CHLORIDE ER 10 MEQ PO TBCR: 10.0000 meq | EXTENDED_RELEASE_TABLET | Freq: Every day | ORAL | Status: DC

## 2012-10-01 MED ORDER — OXYCODONE HCL 10 MG PO TABS: 10.0000 mg | ORAL_TABLET | ORAL | Status: DC | PRN

## 2012-10-01 MED ORDER — HYDROCHLOROTHIAZIDE 12.5 MG PO CAPS: 12.5000 mg | ORAL_CAPSULE | Freq: Every day | ORAL | Status: DC

## 2012-10-01 NOTE — Telephone Encounter (Signed)
Patient informed in previous note that RX is ready

## 2012-10-01 NOTE — Telephone Encounter (Signed)
Patient is requesting a new prescription of her pain medication. She would like to pick up prescription by 5pm today as she does not have any transportation tomorrow. I explained to her that prescription may not be ready today.

## 2012-10-01 NOTE — Telephone Encounter (Signed)
Patient informed and RX at front desk

## 2012-10-01 NOTE — Telephone Encounter (Signed)
Patient left a message stating she would like her HCTZ, Potassium and Oxy refilled. I refilled HCTZ and Potassium  Please advise Oxy refill. Last RX was done on 08-28-12 quantity 210 with 0 refills

## 2012-10-28 ENCOUNTER — Other Ambulatory Visit: Payer: Self-pay

## 2012-10-28 DIAGNOSIS — R52 Pain, unspecified: Secondary | ICD-10-CM

## 2012-10-28 MED ORDER — OXYCODONE HCL 10 MG PO TABS: 10.0000 mg | ORAL_TABLET | ORAL | Status: DC | PRN

## 2012-10-28 NOTE — Telephone Encounter (Signed)
Pt left a message stating that she needs her pain medication refilled. The last RX was done on 10-01-12 quantity 210 with 0 refills

## 2012-10-28 NOTE — Telephone Encounter (Signed)
Patient informed that RX is ready. RX is at the front desk

## 2012-10-31 ENCOUNTER — Other Ambulatory Visit: Payer: Self-pay | Admitting: Family Medicine

## 2012-10-31 NOTE — Telephone Encounter (Signed)
Please advise refill? Last RX done on 08-28-12 quantity 40 with 1 refill  If ok fax to [phone]

## 2012-10-31 NOTE — Telephone Encounter (Signed)
Faxed to pharmacy

## 2012-11-22 ENCOUNTER — Telehealth: Payer: Self-pay | Admitting: Family Medicine

## 2012-11-22 ENCOUNTER — Other Ambulatory Visit: Payer: Self-pay

## 2012-11-22 DIAGNOSIS — R52 Pain, unspecified: Secondary | ICD-10-CM

## 2012-11-22 NOTE — Telephone Encounter (Signed)
Check other notes 

## 2012-11-22 NOTE — Telephone Encounter (Signed)
Patient is requesting a new prescription of oxycodone °

## 2012-11-22 NOTE — Telephone Encounter (Signed)
Patient left a message stating that she would like to have her pain medication refilled. Pt states she doesn't want to have to worry about coming to the office on Mon or Tuesday because her daughter is having a fatal pregnancy at [facility] to deliver her baby. Pt stated the baby has Turners Syndrome. Pt also stated that she wanted me to tell Dr Abner Greenspan how much she appreciates her interest in her family every time she comes to the office.  Please advise?  Last RX was done on 10-28-12 quantity 210 with 0 refills  Pt now is in the lobby

## 2012-11-22 NOTE — Telephone Encounter (Signed)
Cannot have meds early

## 2012-11-26 MED ORDER — OXYCODONE HCL 10 MG PO TABS: 10.0000 mg | ORAL_TABLET | ORAL | Status: DC | PRN

## 2012-11-26 NOTE — Telephone Encounter (Signed)
Pt left a message asking if she can pick up her RX today?  Please advise? Last RX was done on 10-28-12 quantity 210 with 0 refills

## 2012-11-26 NOTE — Addendum Note (Signed)
Addended by: Court Joy on: 11/26/2012 12:59 PM   Modules accepted: Orders

## 2012-12-03 ENCOUNTER — Other Ambulatory Visit: Payer: Self-pay | Admitting: Family Medicine

## 2012-12-03 ENCOUNTER — Ambulatory Visit (INDEPENDENT_AMBULATORY_CARE_PROVIDER_SITE_OTHER): Payer: BC Managed Care – PPO | Admitting: Nurse Practitioner

## 2012-12-03 ENCOUNTER — Encounter: Payer: Self-pay | Admitting: Nurse Practitioner

## 2012-12-03 VITALS — BP 110/84 | HR 105 | Temp 98.0°F | Ht 65.5 in | Wt 162.8 lb

## 2012-12-03 DIAGNOSIS — H6593 Unspecified nonsuppurative otitis media, bilateral: Secondary | ICD-10-CM

## 2012-12-03 DIAGNOSIS — J069 Acute upper respiratory infection, unspecified: Secondary | ICD-10-CM

## 2012-12-03 DIAGNOSIS — H6590 Unspecified nonsuppurative otitis media, unspecified ear: Secondary | ICD-10-CM

## 2012-12-03 MED ORDER — HYDROCOD POLST-CHLORPHEN POLST 10-8 MG/5ML PO LQCR
5.0000 mL | Freq: Every evening | ORAL | Status: DC | PRN
Start: 2012-12-03 — End: 2013-01-02

## 2012-12-03 MED ORDER — DOXYCYCLINE HYCLATE 100 MG PO TABS: 100.0000 mg | ORAL_TABLET | Freq: Two times a day (BID) | ORAL | Status: DC

## 2012-12-03 NOTE — Patient Instructions (Signed)
I think you have a viral illness, possibly flu. If you continue to have fever or develop chest pain, please start antibiotic. Switch sinus rinse to bottle & once daily. Use flonase 1 spray both nostrils twice daily for at least 10 days to help with fluid in ears. Feel better!  Otitis Media with Effusion Otitis media with effusion is the presence of fluid in the middle ear. This is a common problem that often follows ear infections. It may be present for weeks or longer after the infection. Unlike an acute ear infection, otits media with effusion refers only to fluid behind the ear drum and not infection. Children with repeated ear and sinus infections and allergy problems are the most likely to get otitis media with effusion. CAUSES  The most frequent cause of the fluid buildup is dysfunction of the eustacian tubes. These are the tubes that drain fluid in the ears to the throat. SYMPTOMS   The main symptom of this condition is hearing loss. As a result, you or your child may:  Listen to the TV at a loud volume.  Not respond to questions.  Ask "what" often when spoken to.  There may be a sensation of fullness or pressure but usually not pain. DIAGNOSIS   Your caregiver will diagnose this condition by examining you or your child's ears.  Your caregiver may test the pressure in you or your child's ear with a tympanometer.  A hearing test may be conducted if the problem persists.  A caregiver will want to re-evaluate the condition periodically to see if it improves. TREATMENT   Treatment depends on the duration and the effects of the effusion.  Antibiotics, decongestants, nose drops, and cortisone-type drugs may not be helpful.  Children with persistent ear effusions may have delayed language. Children at risk for developmental delays in hearing, learning, and speech may require referral to a specialist earlier than children not at risk.  You or your child's caregiver may suggest a  referral to an Ear, Nose, and Throat (ENT) surgeon for treatment. The following may help restore normal hearing:  Drainage of fluid.  Placement of ear tubes (tympanostomy tubes).  Removal of adenoids (adenoidectomy). HOME CARE INSTRUCTIONS   Avoid second hand smoke.  Infants who are breast fed are less likely to have this condition.  Avoid feeding infants while laying flat.  Avoid known environmental allergens.  Be sure to see a caregiver or an ENT specialist for follow up.  Avoid people who are sick. SEEK MEDICAL CARE IF:   Hearing is not better in 3 months.  Hearing is worse.  Ear pain.  Drainage from the ear.  Dizziness. Document Released: 02/24/2004 Document Revised: 04/10/2011 Document Reviewed: 06/08/2009 [facility] Patient Information 2014 [place], [place].  Influenza Facts Flu (influenza) is a contagious respiratory illness caused by the influenza viruses. It can cause mild to severe illness. While most healthy people recover from the flu without specific treatment and without complications, older people, young children, and people with certain health conditions are at higher risk for serious complications from the flu, including death. CAUSES   The flu virus is spread from person to person by respiratory droplets from coughing and sneezing.  A person can also become infected by touching an object or surface with a virus on it and then touching their mouth, eye or nose.  Adults may be able to infect others from 1 day before symptoms occur and up to 7 days after getting sick. So it is possible  to give someone the flu even before you know you are sick and continue to infect others while you are sick. SYMPTOMS   Fever (usually high).  Headache.  Tiredness (can be extreme).  Cough.  Sore throat.  Runny or stuffy nose.  Body aches.  Diarrhea and vomiting may also occur, particularly in children.  These symptoms are referred to as "flu-like symptoms". A lot  of different illnesses, including the common cold, can have similar symptoms. DIAGNOSIS   There are tests that can determine if you have the flu as long you are tested within the first 2 or 3 days of illness.  A doctor's exam and additional tests may be needed to identify if you have a disease that is a complicating the flu. RISKS AND COMPLICATIONS  Some of the complications caused by the flu include:  Bacterial pneumonia or progressive pneumonia caused by the flu virus.  Loss of body fluids (dehydration).  Worsening of chronic medical conditions, such as heart failure, asthma, or diabetes.  Sinus problems and ear infections. HOME CARE INSTRUCTIONS   Seek medical care early on.  If you are at high risk from complications of the flu, consult your health-care provider as soon as you develop flu-like symptoms. Those at high risk for complications include:  People 65 years or older.  People with chronic medical conditions, including diabetes.  Pregnant women.  Young children.  Your caregiver may recommend use of an antiviral medication to help treat the flu.  If you get the flu, get plenty of rest, drink a lot of liquids, and avoid using alcohol and tobacco.  You can take over-the-counter medications to relieve the symptoms of the flu if your caregiver approves. (Never give aspirin to children or teenagers who have flu-like symptoms, particularly fever). PREVENTION  The single best way to prevent the flu is to get a flu vaccine each fall. Other measures that can help protect against the flu are:  Antiviral Medications  A number of antiviral drugs are approved for use in preventing the flu. These are prescription medications, and a doctor should be consulted before they are used.  Habits for Good Health  Cover your nose and mouth with a tissue when you cough or sneeze, throw the tissue away after you use it.  Wash your hands often with soap and water, especially after you  cough or sneeze. If you are not near water, use an alcohol-based hand cleaner.  Avoid people who are sick.  If you get the flu, stay home from work or school. Avoid contact with other people so that you do not make them sick, too.  Try not to touch your eyes, nose, or mouth as germs ore often spread this way. IN CHILDREN, EMERGENCY WARNING SIGNS THAT NEED URGENT MEDICAL ATTENTION:  Fast breathing or trouble breathing.  Bluish skin color.  Not drinking enough fluids.  Not waking up or not interacting.  Being so irritable that the child does not want to be held.  Flu-like symptoms improve but then return with fever and worse cough.  Fever with a rash. IN ADULTS, EMERGENCY WARNING SIGNS THAT NEED URGENT MEDICAL ATTENTION:  Difficulty breathing or shortness of breath.  Pain or pressure in the chest or abdomen.  Sudden dizziness.  Confusion.  Severe or persistent vomiting. SEEK IMMEDIATE MEDICAL CARE IF:  You or someone you know is experiencing any of the symptoms above. When you arrive at the emergency center,report that you think you have the flu. You may  be asked to wear a mask and/or sit in a secluded area to protect others from getting sick. MAKE SURE YOU:   Understand these instructions.  Monitor your condition.  Seek medical care if you are getting worse, or not improving. Document Released: 01/19/2003 Document Revised: 04/10/2011 Document Reviewed: 10/15/2008 [facility] Patient Information 2014 [place], [place].

## 2012-12-03 NOTE — Progress Notes (Signed)
  Subjective:    Patient ID: Rebecca Boyle, female    DOB: 1970-09-26, 42 y.o.   MRN: [id number]  Cough This is a new problem. The current episode started in the past 7 days (4 days). The problem has been unchanged. The problem occurs every few hours (more frequent at night). The cough is productive of sputum. Associated symptoms include chills, ear congestion, ear pain (fullness), a fever, headaches, myalgias, nasal congestion, postnasal drip and a sore throat (resolved). Pertinent negatives include no chest pain, eye redness, rash, shortness of breath or wheezing. Nothing aggravates the symptoms. She has tried nothing for the symptoms. SLE, H/o pleurisy      Review of Systems  Constitutional: Positive for fever, chills and fatigue. Negative for activity change.  HENT: Positive for ear pain (fullness), postnasal drip and sore throat (resolved). Voice change: hoarse.   Eyes: Negative for redness.  Respiratory: Positive for cough. Negative for chest tightness, shortness of breath and wheezing.   Cardiovascular: Negative for chest pain.  Gastrointestinal: Negative for abdominal pain and diarrhea.  Musculoskeletal: Positive for arthralgias and myalgias.  Skin: Negative for rash.  Neurological: Positive for headaches.  Hematological: Negative for adenopathy.       Objective:   Physical Exam  Vitals reviewed. Constitutional: She is oriented to person, place, and time. She appears well-developed and well-nourished. No distress.  HENT:  Head: Normocephalic and atraumatic.  Right Ear: Tympanic membrane is injected. Tympanic membrane is not retracted and not bulging. A middle ear effusion is present.  Left Ear: Tympanic membrane is not injected, not retracted and not bulging. A middle ear effusion is present.  Ears:  Nose: Mucosal edema (erythematous mucosa) present.  Mouth/Throat: Oropharynx is clear and moist. No oropharyngeal exudate.  Eyes: Conjunctivae are normal. Pupils are equal,  round, and reactive to light. Right eye exhibits no discharge. Left eye exhibits no discharge.  Neck: Normal range of motion. Neck supple. No tracheal deviation present. No thyromegaly present.  Cardiovascular: Normal rate, regular rhythm and normal heart sounds.   No murmur heard. Pulmonary/Chest: Effort normal and breath sounds normal. No respiratory distress. She has no wheezes.  Musculoskeletal:  Normal gait   Lymphadenopathy:    She has no cervical adenopathy.  Neurological: She is alert and oriented to person, place, and time.  Skin: Skin is warm and dry. No rash noted.  Psychiatric: She has a normal mood and affect. Her behavior is normal. Thought content normal.          Assessment & Plan:  1. URI (upper respiratory infection) Prod cough, post nasal drip, hoarse, achy, fever. Pt is on immunosuppressive-PLQ for SLE. Advised not to fill doxy for 2 days-fill if develops chest pain, fever over 100.4. - doxycycline (VIBRA-TABS) 100 MG tablet; Take 1 tablet (100 mg total) by mouth 2 (two) times daily.  Dispense: 14 tablet; Refill: 0 - chlorpheniramine-HYDROcodone (TUSSIONEX PENNKINETIC ER) 10-8 MG/5ML LQCR; Take 5 mLs by mouth at bedtime as needed for cough.  Dispense: 115 mL; Refill: 0  2. Otitis media with effusion, bilateral Use flonase daily for at least 10 days. Stop using netty pot-use bottle instead & decrease to qd. Use mechanical means as demonstrated to help w/eustacian tube function.  3. Flaky skin in bilat ear canals. Psoriasis? Use tea tree oil diluted in olive oil "[place] tea tree oil"-apply in ear canal daily.

## 2012-12-04 NOTE — Telephone Encounter (Signed)
Imitrex is no longer on pt's med list; looks like it was discontinued at time of discharge in June?  Please advise re: refill request.

## 2012-12-23 ENCOUNTER — Telehealth: Payer: Self-pay

## 2012-12-23 NOTE — Telephone Encounter (Signed)
Patient left a message stating she filled her Oxy on 10-28,14 and that the pharmacy only filled 188 tabs not the whole 210. Pt stated the pharmacy was supposed to call and inform us of this.  [facility] called [facility] [place] and spoke to Jonny Ruiz and he states the Oxy hasn't been filled [facility] (or any [facility]) since 10-28-12 quantity 210.  I'm not sure where this was filled then?  Please advise? Drug contract on MD's desk

## 2012-12-24 NOTE — Telephone Encounter (Signed)
Patient called back regarding this. Best # [phone]

## 2012-12-24 NOTE — Telephone Encounter (Signed)
So I need to send this patient a letter stating I will no longer prescribe narcotics for her. We have been unable to trace what pharmacy she used and that is part of her drug contract. Please write a letter and I will review and sign

## 2012-12-24 NOTE — Telephone Encounter (Signed)
Patient called back regarding this. Best # [phone]. She states that this is a friends phone # and she will have this # for a 10-15 more minutes

## 2012-12-24 NOTE — Telephone Encounter (Signed)
Pt came into office. I typed the letter to give to her.  I handed the letter and copy of controlled substance contract to patient and she stated that she didn't know. I informed pt that we spoke to [facility] and they stated they hadn't nor any other [facility] filled this since 10-28-12

## 2013-01-02 ENCOUNTER — Encounter (HOSPITAL_COMMUNITY): Payer: Self-pay | Admitting: Emergency Medicine

## 2013-01-02 ENCOUNTER — Emergency Department (HOSPITAL_COMMUNITY)
Admission: EM | Admit: 2013-01-02 | Discharge: 2013-01-02 | Disposition: A | Discharge status: Home / Self Care | Payer: BC Managed Care – PPO | Attending: Emergency Medicine | Admitting: Emergency Medicine

## 2013-01-02 ENCOUNTER — Emergency Department (HOSPITAL_COMMUNITY): Payer: BC Managed Care – PPO

## 2013-01-02 DIAGNOSIS — Z792 Long term (current) use of antibiotics: Secondary | ICD-10-CM | POA: Insufficient documentation

## 2013-01-02 DIAGNOSIS — M2540 Effusion, unspecified joint: Secondary | ICD-10-CM | POA: Insufficient documentation

## 2013-01-02 DIAGNOSIS — R42 Dizziness and giddiness: Secondary | ICD-10-CM | POA: Insufficient documentation

## 2013-01-02 DIAGNOSIS — F411 Generalized anxiety disorder: Secondary | ICD-10-CM | POA: Insufficient documentation

## 2013-01-02 DIAGNOSIS — R079 Chest pain, unspecified: Secondary | ICD-10-CM

## 2013-01-02 DIAGNOSIS — Z8619 Personal history of other infectious and parasitic diseases: Secondary | ICD-10-CM | POA: Insufficient documentation

## 2013-01-02 DIAGNOSIS — R0602 Shortness of breath: Secondary | ICD-10-CM | POA: Insufficient documentation

## 2013-01-02 DIAGNOSIS — Z8739 Personal history of other diseases of the musculoskeletal system and connective tissue: Secondary | ICD-10-CM | POA: Insufficient documentation

## 2013-01-02 DIAGNOSIS — Z8709 Personal history of other diseases of the respiratory system: Secondary | ICD-10-CM | POA: Insufficient documentation

## 2013-01-02 DIAGNOSIS — M2550 Pain in unspecified joint: Secondary | ICD-10-CM | POA: Insufficient documentation

## 2013-01-02 DIAGNOSIS — Z87440 Personal history of urinary (tract) infections: Secondary | ICD-10-CM | POA: Insufficient documentation

## 2013-01-02 DIAGNOSIS — F3289 Other specified depressive episodes: Secondary | ICD-10-CM | POA: Insufficient documentation

## 2013-01-02 DIAGNOSIS — Z79899 Other long term (current) drug therapy: Secondary | ICD-10-CM | POA: Insufficient documentation

## 2013-01-02 DIAGNOSIS — R011 Cardiac murmur, unspecified: Secondary | ICD-10-CM | POA: Insufficient documentation

## 2013-01-02 DIAGNOSIS — F17200 Nicotine dependence, unspecified, uncomplicated: Secondary | ICD-10-CM | POA: Insufficient documentation

## 2013-01-02 DIAGNOSIS — R61 Generalized hyperhidrosis: Secondary | ICD-10-CM | POA: Insufficient documentation

## 2013-01-02 DIAGNOSIS — F329 Major depressive disorder, single episode, unspecified: Secondary | ICD-10-CM | POA: Insufficient documentation

## 2013-01-02 DIAGNOSIS — R51 Headache: Secondary | ICD-10-CM | POA: Insufficient documentation

## 2013-01-02 DIAGNOSIS — R0789 Other chest pain: Secondary | ICD-10-CM | POA: Insufficient documentation

## 2013-01-02 DIAGNOSIS — I10 Essential (primary) hypertension: Secondary | ICD-10-CM | POA: Insufficient documentation

## 2013-01-02 LAB — BASIC METABOLIC PANEL
BUN: 17 mg/dL (ref 6–23)
CO2: 25 mEq/L (ref 19–32)
Chloride: 103 mEq/L (ref 96–112)
Creatinine, Ser: 0.87 mg/dL (ref 0.50–1.10)
GFR calc Af Amer: >90 mL/min (ref >90)
GFR calc non Af Amer: 80 mL/min — ABNORMAL LOW (ref >90)

## 2013-01-02 LAB — CBC
HCT: 36.4 % (ref 36.0–46.0)
MCHC: 34.9 g/dL (ref 30.0–36.0)
MCV: 91.2 fL (ref 78.0–100.0)
RDW: 13.1 % (ref 11.5–15.5)
WBC: 8.2 10*3/uL (ref 4.0–10.5)

## 2013-01-02 MED ORDER — FENTANYL CITRATE 0.05 MG/ML IJ SOLN
50.0000 ug | Freq: Once | INTRAMUSCULAR | Status: AC
  Administered 2013-01-02: 50 ug via INTRAVENOUS
  Filled 2013-01-02: qty 2

## 2013-01-02 NOTE — ED Notes (Signed)
DO at bedside 

## 2013-01-02 NOTE — ED Notes (Signed)
Pt. States that chest pain started yesterday, pain in center of chest and radiates to right shoulder. Pt. Reports joint pain from lupus. Pt in no acute distress.

## 2013-01-02 NOTE — ED Provider Notes (Signed)
I saw and evaluated the patient, reviewed the resident's note and I agree with the findings and plan.  EKG Interpretation    Date/Time:  Thursday January 02 2013 11:04:43 EST Ventricular Rate:  86 PR Interval:  153 QRS Duration: 94 QT Interval:  380 QTC Calculation: 454 R Axis:   41 Text Interpretation:  Sinus rhythm RSR' in V1 or V2, right VCD or RVH No significant change was found Confirmed by Madicyn Mesina  MD, Carlosdaniel Grob ([id number]) on 01/02/2013 11:12:56 AM           Well-appearing.  Doubt ACS.  Doubt pulmonary embolism.  Labs and x-ray normal.  EKG normal sinus rhythm.  Outpatient PCP followup.  Likely stress  Lyanne Co, MD 01/02/13 [phone]

## 2013-01-02 NOTE — ED Provider Notes (Signed)
CSN: [id number]     Arrival date & time 01/02/13  1051 History   First MD Initiated Contact with Patient 01/02/13 1058     Chief Complaint  Patient presents with  . Chest Pain  . Shortness of Breath   (Consider location/radiation/quality/duration/timing/severity/associated sxs/prior Treatment) HPI Comments: Mr. Banner is a 42 year old woman with PMH of HTN, Lupus, MVP and anxiety/depression.  She presents today after nurses at [facility] noted her diastolic BP to be elevated.  She has been under increased stress recently due to her pregnant daughter's hospitalization and the loss of the infant shortly after delivery earlier today.  The patient has not slept in four days and she began to experience chest pressure, dizziness and headache the day before yesterday.  The chest pressure is intermittent, lasting 1-2 minutes, non-radiating, non-pleuritic, not brought on by exertion or relieved by rest.  She says deep breathing helps relieve it.  She also reports diaphoresis but denies N/V, abdominal pain, change in bowel or bladder, change in vision.  She has also experienced joint pain/swelling and a rash 1 week ago which she attributed to the start of a lupus flare.  Patient is a 42 y.o. female presenting with chest pain and shortness of breath.  Chest Pain Associated symptoms: diaphoresis, dizziness and headache   Associated symptoms: no abdominal pain, no nausea, no shortness of breath and not vomiting   Shortness of Breath Associated symptoms: chest pain, diaphoresis and headaches   Associated symptoms: no abdominal pain and no vomiting     Past Medical History  Diagnosis Date  . Arthritis     hands, hips, knees, back feet  . Depression   . Lupus 2004  . PONV (postoperative nausea and vomiting)   . Syncope, cardiogenic 2002    treated with toprol  . Anxiety   . Heart murmur   . Chicken pox as a child  . Hypertension   . Tachycardia 06/29/2011  . Anxiety and depression 01/04/2011   . Headache disorder 05/16/2011  . Pain, dental 07/19/2011  . Pedal edema 08/14/2011  . Sinusitis acute 08/29/2011  . Knee pain, right 11/12/2011  . UTI (lower urinary tract infection) 11/27/2011  . Preventative health care 07/07/2012   Past Surgical History  Procedure Laterality Date  . Cholecystectomy  2005  . Breast surgery  1995    lumpectomy of left breast- benign  . Right wrist nerve repair - 2008  2008    fell on nail, repair  . Abdominal hysterectomy  2010    had uterus left ovary removed 2010, right ovary removed 8/12   Family History  Problem Relation Age of Onset  . Hyperlipidemia Mother   . Hypertension Mother   . Stroke Mother     X 5 mini  . Cancer Mother 35    cervical  . Heart disease Mother   . Hypertension Father   . Heart disease Father     cad with stent  . Arthritis Father     2 blown discs  . Stroke Maternal Grandmother   . Lupus Maternal Grandmother   . Heart disease Paternal Grandfather    History  Substance Use Topics  . Smoking status: Current Some Day Smoker -- 0.25 packs/day for 10 years    Types: Cigarettes  . Smokeless tobacco: Never Used  . Alcohol Use: No   OB History   Grav Para Term Preterm Abortions TAB SAB Ect Mult Living  Review of Systems  Constitutional: Positive for diaphoresis.  Eyes: Negative for visual disturbance.  Respiratory: Negative for shortness of breath.   Cardiovascular: Positive for chest pain. Negative for leg swelling.  Gastrointestinal: Negative for nausea, vomiting, abdominal pain, diarrhea and constipation.  Genitourinary: Negative for dysuria and frequency.  Musculoskeletal: Positive for arthralgias and joint swelling.  Neurological: Positive for dizziness and headaches.    Allergies  Morphine and related; Tylenol; and Codeine  Home Medications   Current Outpatient Rx  Name  Route  Sig  Dispense  Refill  . albuterol (PROVENTIL HFA;VENTOLIN HFA) 108 (90 BASE) MCG/ACT inhaler    Inhalation   Inhale 2 puffs into the lungs every 4 (four) hours as needed for wheezing. For exercise induced asthma   1 Inhaler   5   . chlorpheniramine-HYDROcodone (TUSSIONEX PENNKINETIC ER) 10-8 MG/5ML LQCR   Oral   Take 5 mLs by mouth at bedtime as needed for cough.   115 mL   0   . diazepam (VALIUM) 10 MG tablet      TAKE 1 TABLET BY MOUTH EVERY 12 HOURS AS NEEDED FOR ANXIETY   40 tablet   1   . doxycycline (VIBRA-TABS) 100 MG tablet   Oral   Take 1 tablet (100 mg total) by mouth 2 (two) times daily.   14 tablet   0   . esomeprazole (NEXIUM) 40 MG capsule   Oral   Take 1 capsule (40 mg total) by mouth daily before breakfast.   30 capsule   2   . Estradiol 0.75 MG/1.25 GM (0.06%) topical gel   Transdermal   Place 1.25 g onto the skin daily.   1 Bottle   12   . hydrochlorothiazide (MICROZIDE) 12.5 MG capsule   Oral   Take 1 capsule (12.5 mg total) by mouth daily.   30 capsule   4   . lisinopril (PRINIVIL,ZESTRIL) 10 MG tablet   Oral   Take 1 tablet (10 mg total) by mouth 2 (two) times daily.   60 tablet   3   . metoprolol succinate (TOPROL-XL) 100 MG 24 hr tablet   Oral   Take 1 tablet (100 mg total) by mouth daily. In PM   30 tablet   4   . Multiple Vitamin (MULITIVITAMIN WITH MINERALS) TABS   Oral   Take 1 tablet by mouth daily.         . Oxycodone HCl 10 MG TABS   Oral   Take 1 tablet (10 mg total) by mouth every 3 (three) hours as needed.   210 tablet   0   . potassium chloride (K-DUR) 10 MEQ tablet   Oral   Take 1 tablet (10 mEq total) by mouth daily.   30 tablet   4   . Probiotic Product (MISC INTESTINAL FLORA REGULAT) CHEW      Digestive Health probiotic gummies by Schiff daily   60 tablet   5   . SUMAtriptan (IMITREX) 50 MG tablet      TAKE 1 TABLET (50 MG TOTAL) BY MOUTH EVERY 2 (TWO) HOURS AS NEEDED FOR MIGRAINE.   9 tablet   2   . venlafaxine XR (EFFEXOR XR) 75 MG 24 hr capsule   Oral   Take 3 capsules (225 mg total) by  mouth daily.   90 capsule   2   . vitamin C (ASCORBIC ACID) 500 MG tablet   Oral   Take 500 mg by mouth daily.  BP 136/104  Pulse 89  Temp(Src) 98.2 F (36.8 C) (Oral)  Resp 16  SpO2 98% Physical Exam  Constitutional: She is oriented to person, place, and time. She appears well-developed and well-nourished. No distress.  HENT:  Head: Normocephalic and atraumatic.  Mouth/Throat: Oropharynx is clear and moist. No oropharyngeal exudate.  Eyes: EOM are normal. Pupils are equal, round, and reactive to light. Right eye exhibits no discharge. Left eye exhibits no discharge. No scleral icterus.  Neck: Neck supple.  Cardiovascular: Normal rate, regular rhythm and normal heart sounds.   Pulmonary/Chest: Effort normal and breath sounds normal. No respiratory distress. She has no wheezes. She has no rales.  Abdominal: Soft. Bowel sounds are normal. She exhibits no distension. There is no tenderness. There is no guarding.  Musculoskeletal: Normal range of motion. She exhibits no edema and no tenderness.  Neurological: She is alert and oriented to person, place, and time. No cranial nerve deficit.  Skin: Skin is warm. She is not diaphoretic.  Psychiatric: She has a normal mood and affect. Her behavior is normal.    ED Course  Procedures (including critical care time) Labs Review Labs Reviewed  CBC  BASIC METABOLIC PANEL  PRO B NATRIURETIC PEPTIDE   Imaging Review No results found.  EKG Interpretation    Date/Time:  Thursday January 02 2013 11:04:43 EST Ventricular Rate:  86 PR Interval:  153 QRS Duration: 94 QT Interval:  380 QTC Calculation: 454 R Axis:   41 Text Interpretation:  Sinus rhythm RSR' in V1 or V2, right VCD or RVH No significant change was found Confirmed by CAMPOS  MD, KEVIN ([id number]) on 01/02/2013 11:12:56 AM            MDM  42 year old woman with PMH of lupus and hypertension presenting with elevated BP and chest pressure.  Also, under  significant stress given her pregnant daughter's hospitalization and the loss of her infant shortly after delivery this morning.  Chest pressure relieved by deep breathing and elevated BP likely stress related.  Will need to rule out ACS, PE.  Obtain labs, troponin, EKG, CXR.  ACS ruled out given negative troponin and no acute ischemia on EKG.  PE unlikely given non-pleuritic CP, absence of tachycardia or hypoxia, Wells score 0.  Pneumonia unlikely given lack of URI symptoms and negative CXR.  Her chest pain is likely related to the recent stress and loss she has experienced.  Her symptoms have improved during ED stay and she is stable for discharge home and close follow-up with her PCP.    Evelena Peat, DO 01/02/13 1525

## 2013-01-03 ENCOUNTER — Telehealth: Payer: Self-pay | Admitting: Family Medicine

## 2013-01-03 NOTE — Telephone Encounter (Signed)
Please schedule appt

## 2013-01-03 NOTE — Telephone Encounter (Signed)
Reviewed ED note. bp 130s over 80s

## 2013-01-03 NOTE — Telephone Encounter (Signed)
Can follow up next week or two

## 2013-01-03 NOTE — Telephone Encounter (Signed)
Pt is request to see Dr. Abner Greenspan today. Was seen in er last night for elevated BP 178/122. I hold her Dr Abner Greenspan was full today and offered her appt with Malva Cogan, she refused.

## 2013-01-06 ENCOUNTER — Encounter (HOSPITAL_BASED_OUTPATIENT_CLINIC_OR_DEPARTMENT_OTHER): Payer: Self-pay | Admitting: Emergency Medicine

## 2013-01-06 ENCOUNTER — Emergency Department (HOSPITAL_BASED_OUTPATIENT_CLINIC_OR_DEPARTMENT_OTHER)
Admission: EM | Admit: 2013-01-06 | Discharge: 2013-01-06 | Disposition: A | Discharge status: Home / Self Care | Payer: BC Managed Care – PPO | Attending: Emergency Medicine | Admitting: Emergency Medicine

## 2013-01-06 DIAGNOSIS — R609 Edema, unspecified: Secondary | ICD-10-CM | POA: Insufficient documentation

## 2013-01-06 DIAGNOSIS — Z79899 Other long term (current) drug therapy: Secondary | ICD-10-CM | POA: Insufficient documentation

## 2013-01-06 DIAGNOSIS — M1710 Unilateral primary osteoarthritis, unspecified knee: Secondary | ICD-10-CM | POA: Insufficient documentation

## 2013-01-06 DIAGNOSIS — Z8619 Personal history of other infectious and parasitic diseases: Secondary | ICD-10-CM | POA: Insufficient documentation

## 2013-01-06 DIAGNOSIS — F411 Generalized anxiety disorder: Secondary | ICD-10-CM | POA: Insufficient documentation

## 2013-01-06 DIAGNOSIS — M19079 Primary osteoarthritis, unspecified ankle and foot: Secondary | ICD-10-CM | POA: Insufficient documentation

## 2013-01-06 DIAGNOSIS — R011 Cardiac murmur, unspecified: Secondary | ICD-10-CM | POA: Insufficient documentation

## 2013-01-06 DIAGNOSIS — M19049 Primary osteoarthritis, unspecified hand: Secondary | ICD-10-CM | POA: Insufficient documentation

## 2013-01-06 DIAGNOSIS — F329 Major depressive disorder, single episode, unspecified: Secondary | ICD-10-CM | POA: Insufficient documentation

## 2013-01-06 DIAGNOSIS — F419 Anxiety disorder, unspecified: Secondary | ICD-10-CM

## 2013-01-06 DIAGNOSIS — F439 Reaction to severe stress, unspecified: Secondary | ICD-10-CM

## 2013-01-06 DIAGNOSIS — F3289 Other specified depressive episodes: Secondary | ICD-10-CM | POA: Insufficient documentation

## 2013-01-06 DIAGNOSIS — F17200 Nicotine dependence, unspecified, uncomplicated: Secondary | ICD-10-CM | POA: Insufficient documentation

## 2013-01-06 DIAGNOSIS — I10 Essential (primary) hypertension: Secondary | ICD-10-CM | POA: Insufficient documentation

## 2013-01-06 DIAGNOSIS — R51 Headache: Secondary | ICD-10-CM | POA: Insufficient documentation

## 2013-01-06 DIAGNOSIS — R450 Nervousness: Secondary | ICD-10-CM | POA: Insufficient documentation

## 2013-01-06 DIAGNOSIS — Z8669 Personal history of other diseases of the nervous system and sense organs: Secondary | ICD-10-CM | POA: Insufficient documentation

## 2013-01-06 DIAGNOSIS — F430 Acute stress reaction: Secondary | ICD-10-CM | POA: Insufficient documentation

## 2013-01-06 DIAGNOSIS — M1610 Unilateral primary osteoarthritis, unspecified hip: Secondary | ICD-10-CM | POA: Insufficient documentation

## 2013-01-06 DIAGNOSIS — Z87440 Personal history of urinary (tract) infections: Secondary | ICD-10-CM | POA: Insufficient documentation

## 2013-01-06 DIAGNOSIS — M329 Systemic lupus erythematosus, unspecified: Secondary | ICD-10-CM | POA: Insufficient documentation

## 2013-01-06 MED ORDER — LORAZEPAM 1 MG PO TABS: 1.0000 mg | ORAL_TABLET | Freq: Three times a day (TID) | ORAL | Status: DC | PRN

## 2013-01-06 MED ORDER — LORAZEPAM 1 MG PO TABS
1.0000 mg | ORAL_TABLET | Freq: Once | ORAL | Status: AC
  Administered 2013-01-06: 1 mg via ORAL
  Filled 2013-01-06: qty 1

## 2013-01-06 NOTE — ED Provider Notes (Signed)
CSN: [id number]     Arrival date & time 01/06/13  1936 History   First MD Initiated Contact with Patient 01/06/13 1943     Chief Complaint  Patient presents with  . Hypertension   (Consider location/radiation/quality/duration/timing/severity/associated sxs/prior Treatment) HPI Comments: Patient is a 42 year old female with an extensive past medical history including hypertension, lupus, anxiety and depression who presents to the emergency department with concerns of high blood pressure. Patient was seen on December 4 at [facility] for the same, had a cardiac workup which was unremarkable. Patient states she has been under a lot of increased stress over the past 5 days, her daughter recently gave birth to a baby boy with Trisomy-18, the baby passed away after about 10 minutes, patient has been sitting at her daughter's hospital bedside ever since. States she's been getting headaches, a paramedic has been checking her blood pressure, today her blood pressure had a diastolic of 118 and she was advised to go to the emergency department. She took her blood pressure medications today as prescribed, hydrochlorothiazide, lisinopril and metoprolol. Denies chest pain, dizziness, vision changes, nausea or vomiting. She is feeling very anxious and stressed.  Patient is a 42 y.o. female presenting with hypertension. The history is provided by the patient.  Hypertension Associated symptoms include headaches.    Past Medical History  Diagnosis Date  . Arthritis     hands, hips, knees, back feet  . Depression   . Lupus 2004  . PONV (postoperative nausea and vomiting)   . Syncope, cardiogenic 2002    treated with toprol  . Anxiety   . Heart murmur   . Chicken pox as a child  . Hypertension   . Tachycardia 06/29/2011  . Anxiety and depression 01/04/2011  . Headache disorder 05/16/2011  . Pain, dental 07/19/2011  . Pedal edema 08/14/2011  . Sinusitis acute 08/29/2011  . Knee pain, right 11/12/2011  . UTI  (lower urinary tract infection) 11/27/2011  . Preventative health care 07/07/2012   Past Surgical History  Procedure Laterality Date  . Cholecystectomy  2005  . Breast surgery  1995    lumpectomy of left breast- benign  . Right wrist nerve repair - 2008  2008    fell on nail, repair  . Abdominal hysterectomy  2010    had uterus left ovary removed 2010, right ovary removed 8/12   Family History  Problem Relation Age of Onset  . Hyperlipidemia Mother   . Hypertension Mother   . Stroke Mother     X 5 mini  . Cancer Mother 39    cervical  . Heart disease Mother   . Hypertension Father   . Heart disease Father     cad with stent  . Arthritis Father     2 blown discs  . Stroke Maternal Grandmother   . Lupus Maternal Grandmother   . Heart disease Paternal Grandfather    History  Substance Use Topics  . Smoking status: Current Some Day Smoker -- 0.25 packs/day for 10 years    Types: Cigarettes  . Smokeless tobacco: Never Used  . Alcohol Use: No   OB History   Grav Para Term Preterm Abortions TAB SAB Ect Mult Living                 Review of Systems  Neurological: Positive for headaches.  Psychiatric/Behavioral: The patient is nervous/anxious.   All other systems reviewed and are negative.    Allergies  Morphine and related; Tylenol;  and Codeine  Home Medications   Current Outpatient Rx  Name  Route  Sig  Dispense  Refill  . albuterol (PROVENTIL HFA;VENTOLIN HFA) 108 (90 BASE) MCG/ACT inhaler   Inhalation   Inhale 2 puffs into the lungs every 4 (four) hours as needed for wheezing. For exercise induced asthma   1 Inhaler   5   . esomeprazole (NEXIUM) 40 MG capsule   Oral   Take 1 capsule (40 mg total) by mouth daily before breakfast.   30 capsule   2   . Estradiol 0.75 MG/1.25 GM (0.06%) topical gel   Transdermal   Place 1.25 g onto the skin daily.   1 Bottle   12   . hydrochlorothiazide (MICROZIDE) 12.5 MG capsule   Oral   Take 1 capsule (12.5 mg  total) by mouth daily.   30 capsule   4   . hydroxychloroquine (PLAQUENIL) 200 MG tablet   Oral   Take 100 mg by mouth daily.         Marland Kitchen lisinopril (PRINIVIL,ZESTRIL) 10 MG tablet   Oral   Take 1 tablet (10 mg total) by mouth 2 (two) times daily.   60 tablet   3   . metoprolol succinate (TOPROL-XL) 100 MG 24 hr tablet   Oral   Take 1 tablet (100 mg total) by mouth daily. In PM   30 tablet   4   . Multiple Vitamin (MULITIVITAMIN WITH MINERALS) TABS   Oral   Take 1 tablet by mouth daily.         . potassium chloride (K-DUR) 10 MEQ tablet   Oral   Take 1 tablet (10 mEq total) by mouth daily.   30 tablet   4   . Probiotic Product (MISC INTESTINAL FLORA REGULAT) CHEW      Digestive Health probiotic gummies by Schiff daily   60 tablet   5   . venlafaxine XR (EFFEXOR XR) 75 MG 24 hr capsule   Oral   Take 3 capsules (225 mg total) by mouth daily.   90 capsule   2    BP 142/104  Pulse 72  Temp(Src) 98.3 F (36.8 C) (Oral)  Resp 20  Ht 5\' 6"  (1.676 m)  Wt 160 lb (72.576 kg)  BMI 25.84 kg/m2  SpO2 100% Physical Exam  Nursing note and vitals reviewed. Constitutional: She is oriented to person, place, and time. She appears well-developed and well-nourished. No distress.  HENT:  Head: Normocephalic and atraumatic.  Mouth/Throat: Oropharynx is clear and moist.  Eyes: Conjunctivae and EOM are normal. Pupils are equal, round, and reactive to light.  Neck: Normal range of motion. Neck supple.  Cardiovascular: Normal rate, regular rhythm, normal heart sounds and intact distal pulses.   Pulmonary/Chest: Effort normal and breath sounds normal.  Abdominal: Soft. Bowel sounds are normal. There is no tenderness.  Musculoskeletal: Normal range of motion. She exhibits no edema.  Neurological: She is alert and oriented to person, place, and time. She has normal strength. No cranial nerve deficit or sensory deficit. She displays a negative Romberg sign. Coordination normal.   Skin: Skin is warm and dry. She is not diaphoretic.  Psychiatric: She has a normal mood and affect. Her behavior is normal.    ED Course  Procedures (including critical care time) Labs Review Labs Reviewed - No data to display Imaging Review No results found.  EKG Interpretation   None       MDM   1. Hypertension  2. Anxiety   3. Stress     Pt presenting with HTN. She is well appearing and in NAD. BP 142/104. Has a headache, no other symptoms present. Had cardiac workup on 12/4 which was unremarkable. She is under increased stress and very anxious. Plan to given ativan and recheck blood pressure. Case discussed with attending Dr. Rubin Payor who agrees with plan of care. 8:52 PM BP 136/87 after ativan. She is feeling more relaxed and headache is improving. She is stable for discharge. Rx for 15 ativan given, she will discuss this with her PCP. Return precautions given. Patient states understanding of treatment care plan and is agreeable.   Trevor Mace, PA-C 01/06/13 2053

## 2013-01-06 NOTE — ED Notes (Signed)
Hypertension. Was seen at [facility] a few days ago for same.

## 2013-01-06 NOTE — ED Provider Notes (Signed)
Medical screening examination/treatment/procedure(s) were performed by non-physician practitioner and as supervising physician I was immediately available for consultation/collaboration.  EKG Interpretation   None        Damain Broadus R. Khalfani Weideman, MD 01/06/13 2331 

## 2013-01-06 NOTE — Telephone Encounter (Signed)
Left message for patient to return my call.

## 2013-01-14 ENCOUNTER — Ambulatory Visit: Payer: BC Managed Care – PPO | Admitting: Family Medicine

## 2013-01-24 ENCOUNTER — Ambulatory Visit (INDEPENDENT_AMBULATORY_CARE_PROVIDER_SITE_OTHER): Payer: BC Managed Care – PPO | Admitting: Family Medicine

## 2013-01-24 ENCOUNTER — Encounter: Payer: Self-pay | Admitting: Family Medicine

## 2013-01-24 VITALS — BP 122/102 | HR 90 | Temp 98.2°F | Ht 65.5 in | Wt 165.1 lb

## 2013-01-24 DIAGNOSIS — R000 Tachycardia, unspecified: Secondary | ICD-10-CM

## 2013-01-24 DIAGNOSIS — F329 Major depressive disorder, single episode, unspecified: Secondary | ICD-10-CM

## 2013-01-24 DIAGNOSIS — F17200 Nicotine dependence, unspecified, uncomplicated: Secondary | ICD-10-CM

## 2013-01-24 DIAGNOSIS — I10 Essential (primary) hypertension: Secondary | ICD-10-CM

## 2013-01-24 DIAGNOSIS — F341 Dysthymic disorder: Secondary | ICD-10-CM

## 2013-01-24 MED ORDER — OLANZAPINE 2.5 MG PO TABS: 2.5000 mg | ORAL_TABLET | Freq: Every day | ORAL | Status: DC

## 2013-01-24 MED ORDER — LORAZEPAM 0.5 MG PO TABS: 0.5000 mg | ORAL_TABLET | Freq: Every day | ORAL | Status: DC | PRN

## 2013-01-24 MED ORDER — VENLAFAXINE HCL ER 75 MG PO CP24: 150.0000 mg | ORAL_CAPSULE | Freq: Every day | ORAL | Status: DC

## 2013-01-24 MED ORDER — METOPROLOL SUCCINATE ER 50 MG PO TB24: 50.0000 mg | ORAL_TABLET | Freq: Every day | ORAL | Status: DC

## 2013-01-24 NOTE — Progress Notes (Signed)
Pre visit review using our clinic review tool, if applicable. No additional management support is needed unless otherwise documented below in the visit note. 

## 2013-01-24 NOTE — Patient Instructions (Signed)
DASH Diet The DASH diet stands for "Dietary Approaches to Stop Hypertension." It is a healthy eating plan that has been shown to reduce high blood pressure (hypertension) in as little as 14 days, while also possibly providing other significant health benefits. These other health benefits include reducing the risk of breast cancer after menopause and reducing the risk of type 2 diabetes, heart disease, colon cancer, and stroke. Health benefits also include weight loss and slowing kidney failure in patients with chronic kidney disease.  DIET GUIDELINES  Limit salt (sodium). Your diet should contain less than 1500 mg of sodium daily.  Limit refined or processed carbohydrates. Your diet should include mostly whole grains. Desserts and added sugars should be used sparingly.  Include small amounts of heart-healthy fats. These types of fats include nuts, oils, and tub margarine. Limit saturated and trans fats. These fats have been shown to be harmful in the body. CHOOSING FOODS  The following food groups are based on a 2000 calorie diet. See your Registered Dietitian for individual calorie needs. Grains and Grain Products (6 to 8 servings daily)  Eat More Often: Whole-wheat bread, brown rice, whole-grain or wheat pasta, quinoa, popcorn without added fat or salt (air popped).  Eat Less Often: White bread, white pasta, white rice, cornbread. Vegetables (4 to 5 servings daily)  Eat More Often: Fresh, frozen, and canned vegetables. Vegetables may be raw, steamed, roasted, or grilled with a minimal amount of fat.  Eat Less Often/Avoid: Creamed or fried vegetables. Vegetables in a cheese sauce. Fruit (4 to 5 servings daily)  Eat More Often: All fresh, canned (in natural juice), or frozen fruits. Dried fruits without added sugar. One hundred percent fruit juice ( cup [237 mL] daily).  Eat Less Often: Dried fruits with added sugar. Canned fruit in light or heavy syrup. [employer], Fish, and Poultry (2  servings or less daily. One serving is 3 to 4 oz [85-114 g]).  Eat More Often: Ninety percent or leaner ground beef, tenderloin, sirloin. Round cuts of beef, chicken breast, [place] breast. All fish. Grill, bake, or broil your meat. Nothing should be fried.  Eat Less Often/Avoid: Fatty cuts of meat, [place], or chicken leg, thigh, or wing. Fried cuts of meat or fish. Dairy (2 to 3 servings)  Eat More Often: Low-fat or fat-free milk, low-fat plain or light yogurt, reduced-fat or part-skim cheese.  Eat Less Often/Avoid: Milk (whole, 2%).Whole milk yogurt. Full-fat cheeses. Nuts, Seeds, and Legumes (4 to 5 servings per week)  Eat More Often: All without added salt.  Eat Less Often/Avoid: Salted nuts and seeds, canned beans with added salt. Fats and Sweets (limited)  Eat More Often: Vegetable oils, tub margarines without trans fats, sugar-free gelatin. Mayonnaise and salad dressings.  Eat Less Often/Avoid: Coconut oils, palm oils, butter, stick margarine, cream, half and half, cookies, candy, pie. FOR MORE INFORMATION The Dash Diet Eating Plan: www.dashdiet.org Document Released: 01/05/2011 Document Revised: 04/10/2011 Document Reviewed: 01/05/2011 [facility] Patient Information 2014 [place], [place]. Grief Reaction Grief is a normal response to the death of someone close to you. Feelings of fear, anger, and guilt can affect almost everyone who loses someone they love. Symptoms of depression are also common. These include problems with sleep, loss of appetite, and lack of energy. These grief reaction symptoms often last for weeks to months after a loss. They may also return during special times that remind you of the person you lost, such as an anniversary or birthday. Anxiety, insomnia, irritability, and deep depression  may last beyond the period of normal grief. If you experience these feelings for 6 months or longer, you may have clinical depression. Clinical depression requires further medical  attention. If you think that you have clinical depression, you should contact your caregiver. If you have a history of depression and or a family history of depression, you are at greater risk of clinical depression. You are also at greater risk of developing clinical depression if the loss was traumatic or the loss was of someone with whom you had unresolved issues.  A grief reaction can become complicated by being blocked. This means being unable to cry or express extreme emotions. This may prolong the grieving period and worsen the emotional effects of the loss. Mourning is a natural event in human life. A healthy grief reaction is one that is not blocked . It requires a time of sadness and readjustment.It is very important to share your sorrow and fear with others, especially close friends and family. Professional counselors and clergy can also help you process your grief. Document Released: 01/16/2005 Document Revised: 04/10/2011 Document Reviewed: 09/26/2005 [facility] Patient Information 2014 [place], [place].

## 2013-01-24 NOTE — Telephone Encounter (Signed)
Pt was in today and said she would like to clarify the conversation on 12-23-12. Pt states she was never offered an appt with Selena Batten? Pt stated she just wants to clarify that she would never turn down an appt.

## 2013-01-26 ENCOUNTER — Encounter: Payer: Self-pay | Admitting: Family Medicine

## 2013-01-26 NOTE — Assessment & Plan Note (Signed)
Increase Toprol to 150 mg daily.

## 2013-01-26 NOTE — Assessment & Plan Note (Signed)
Unfortunately still smoking, encouraged cessation

## 2013-01-26 NOTE — Progress Notes (Signed)
Patient ID: Rebecca Boyle, female   DOB: 04-Dec-1970, 42 y.o.   MRN: [id number] Rebecca Boyle [id number] 04/05/1970 01/26/2013      Progress Note-Follow Up  Subjective  Chief Complaint  Chief Complaint  Patient presents with  . Follow-up    on BP    HPI  Patient is a 42 year old Caucasian female who is in today for followup after being in the emergency room. She's been struggling with high blood pressure notes her pressure this morning was 122/126. She has diastolics consistently over 100. She has been under a great deal of stress. Her grandson was just born and unfortunately.Marland Kitchen He was born with Edwards chromosome. She is tearful in the visit. She reports having flushing as well as insomnia she denies suicidal ideation. No flare and headaches. No chest pain or palpitations  Past Medical History  Diagnosis Date  . Arthritis     hands, hips, knees, back feet  . Depression   . Lupus 2004  . PONV (postoperative nausea and vomiting)   . Syncope, cardiogenic 2002    treated with toprol  . Anxiety   . Heart murmur   . Chicken pox as a child  . Hypertension   . Tachycardia 06/29/2011  . Anxiety and depression 01/04/2011  . Headache disorder 05/16/2011  . Pain, dental 07/19/2011  . Pedal edema 08/14/2011  . Sinusitis acute 08/29/2011  . Knee pain, right 11/12/2011  . UTI (lower urinary tract infection) 11/27/2011  . Preventative health care 07/07/2012    Past Surgical History  Procedure Laterality Date  . Cholecystectomy  2005  . Breast surgery  1995    lumpectomy of left breast- benign  . Right wrist nerve repair - 2008  2008    fell on nail, repair  . Abdominal hysterectomy  2010    had uterus left ovary removed 2010, right ovary removed 8/12    Family History  Problem Relation Age of Onset  . Hyperlipidemia Mother   . Hypertension Mother   . Stroke Mother     X 5 mini  . Cancer Mother 68    cervical  . Heart disease Mother   . Hypertension Father   . Heart  disease Father     cad with stent  . Arthritis Father     2 blown discs  . Stroke Maternal Grandmother   . Lupus Maternal Grandmother   . Heart disease Paternal Grandfather     History   Social History  . Marital Status: Legally Separated    Spouse Name: N/A    Number of Children: N/A  . Years of Education: college   Occupational History  . Not on file.   Social History Main Topics  . Smoking status: Current Some Day Smoker -- 0.25 packs/day for 10 years    Types: Cigarettes  . Smokeless tobacco: Never Used  . Alcohol Use: No  . Drug Use: No  . Sexual Activity: No   Other Topics Concern  . 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    Smokes 2 cigarettes a day   Denies ETOH    Current Outpatient Prescriptions on File Prior to Visit  Medication Sig Dispense Refill  . albuterol (PROVENTIL HFA;VENTOLIN HFA) 108 (90 BASE) MCG/ACT inhaler Inhale 2 puffs into the lungs every  4 (four) hours as needed for wheezing. For exercise induced asthma  1 Inhaler  5  . esomeprazole (NEXIUM) 40 MG capsule Take 1 capsule (40 mg total) by mouth daily before breakfast.  30 capsule  2  . Estradiol 0.75 MG/1.25 GM (0.06%) topical gel Place 1.25 g onto the skin daily.  1 Bottle  12  . hydrochlorothiazide (MICROZIDE) 12.5 MG capsule Take 1 capsule (12.5 mg total) by mouth daily.  30 capsule  4  . hydroxychloroquine (PLAQUENIL) 200 MG tablet Take 100 mg by mouth daily.      Marland Kitchen lisinopril (PRINIVIL,ZESTRIL) 10 MG tablet Take 1 tablet (10 mg total) by mouth 2 (two) times daily.  60 tablet  3  . metoprolol succinate (TOPROL-XL) 100 MG 24 hr tablet Take 1 tablet (100 mg total) by mouth daily. In PM  30 tablet  4  . Multiple Vitamin (MULITIVITAMIN WITH MINERALS) TABS Take 1 tablet by mouth daily.      . potassium chloride (K-DUR) 10 MEQ tablet Take 1  tablet (10 mEq total) by mouth daily.  30 tablet  4  . Probiotic Product (MISC INTESTINAL FLORA REGULAT) CHEW Digestive Health probiotic gummies by Schiff daily  60 tablet  5   No current facility-administered medications on file prior to visit.    Allergies  Allergen Reactions  . Morphine And Related Shortness Of Breath and Itching  . Tylenol [Acetaminophen] Rash    Pt states she has liver damage, and is also taking plaquenil  . Codeine Itching and Nausea And Vomiting    Review of Systems  Review of Systems  Constitutional: Negative for fever and malaise/fatigue.  HENT: Negative for congestion.   Eyes: Negative for discharge.  Respiratory: Negative for shortness of breath.   Cardiovascular: Negative for chest pain, palpitations and leg swelling.  Gastrointestinal: Negative for nausea, abdominal pain and diarrhea.  Genitourinary: Negative for dysuria.  Musculoskeletal: Negative for falls.  Skin: Negative for rash.  Neurological: Negative for loss of consciousness and headaches.  Endo/Heme/Allergies: Negative for polydipsia.  Psychiatric/Behavioral: Positive for depression. Negative for suicidal ideas. The patient is nervous/anxious and has insomnia.     Objective  BP 122/102  Pulse 90  Temp(Src) 98.2 F (36.8 C) (Oral)  Ht 5' 5.5" (1.664 m)  Wt 165 lb 1.3 oz (74.88 kg)  BMI 27.04 kg/m2  SpO2 98%  Physical Exam  Physical Exam  Constitutional: She is oriented to person, place, and time and well-developed, well-nourished, and in no distress. No distress.  HENT:  Head: Normocephalic and atraumatic.  Eyes: Conjunctivae are normal.  Neck: Neck supple. No thyromegaly present.  Cardiovascular: Normal rate, regular rhythm and normal heart sounds.   No murmur heard. Pulmonary/Chest: Effort normal and breath sounds normal. She has no wheezes.  Abdominal: She exhibits no distension and no mass.  Musculoskeletal: She exhibits no edema.  Lymphadenopathy:    She has no  cervical adenopathy.  Neurological: She is alert and oriented to person, place, and time.  Skin: Skin is warm and dry. No rash noted. She is not diaphoretic.  Psychiatric: Memory, affect and judgment normal.    Lab Results  Component Value Date   TSH 0.44 06/29/2011   Lab Results  Component Value Date   WBC 8.2 01/02/2013   HGB 12.7 01/02/2013   HCT 36.4 01/02/2013   MCV 91.2 01/02/2013   PLT 248 01/02/2013   Lab Results  Component Value Date   CREATININE 0.87 01/02/2013   BUN 17 01/02/2013  NA 140 01/02/2013   K 3.5 01/02/2013   CL 103 01/02/2013   CO2 25 01/02/2013   Lab Results  Component Value Date   ALT 56* 06/29/2011   AST 35 06/29/2011   ALKPHOS 82 06/29/2011   BILITOT 0.3 06/29/2011   Lab Results  Component Value Date   CHOL 205* 06/29/2011   Lab Results  Component Value Date   HDL 47.30 06/29/2011   No results found for this basename: [facility]   Lab Results  Component Value Date   TRIG 111.0 06/29/2011   Lab Results  Component Value Date   CHOLHDL 4 06/29/2011     Assessment & Plan  Tobacco use disorder Unfortunately still smoking, encouraged cessation  Tachycardia Mild, will increase Toprol  HTN (hypertension) Increase Toprol to 150 mg daily  Anxiety and depression Grandson was just born and died with Edwards syndrome. She is very tearful and has trouble concentrating and sleeping will add Zyprexa 2.5 mg qhs. Bipolar screen was negative.

## 2013-01-26 NOTE — Assessment & Plan Note (Signed)
Mild, will increase Toprol

## 2013-01-26 NOTE — Assessment & Plan Note (Signed)
Rebecca Boyle was just born and died with Edwards syndrome. She is very tearful and has trouble concentrating and sleeping will add Zyprexa 2.5 mg qhs. Bipolar screen was negative.

## 2013-02-04 ENCOUNTER — Telehealth: Payer: Self-pay | Admitting: Family Medicine

## 2013-02-04 NOTE — Telephone Encounter (Signed)
OK to increase Zyprexa to 5 mg qhs and return in next 1-2 weeks to discuss further options

## 2013-02-04 NOTE — Telephone Encounter (Signed)
Please advise 

## 2013-02-04 NOTE — Telephone Encounter (Signed)
The new medicine zyprexa has worked well has evened out the moods and the depression.  She is still having some difficulty with anxiety  Please call the patient and advise what to do

## 2013-02-05 NOTE — Telephone Encounter (Signed)
Left detailed message on cell# and to call if any questions. Advised pt to take 2 of current tablets and we can send new rx for 5mg  tabs at her follow up on 02/27/13 if Provider decides to keep her on that dose.

## 2013-02-05 NOTE — Addendum Note (Signed)
Addended by: Mervin KungFERGERSON, Desiray Orchard A on: 02/05/2013 04:24 PM   Modules accepted: Orders

## 2013-02-27 ENCOUNTER — Encounter: Payer: Self-pay | Admitting: Family Medicine

## 2013-02-27 ENCOUNTER — Ambulatory Visit (INDEPENDENT_AMBULATORY_CARE_PROVIDER_SITE_OTHER): Payer: BC Managed Care – PPO | Admitting: Family Medicine

## 2013-02-27 VITALS — BP 110/84 | HR 78 | Temp 98.0°F | Ht 65.5 in | Wt 169.1 lb

## 2013-02-27 DIAGNOSIS — F341 Dysthymic disorder: Secondary | ICD-10-CM

## 2013-02-27 DIAGNOSIS — F419 Anxiety disorder, unspecified: Secondary | ICD-10-CM

## 2013-02-27 DIAGNOSIS — F17200 Nicotine dependence, unspecified, uncomplicated: Secondary | ICD-10-CM

## 2013-02-27 DIAGNOSIS — R000 Tachycardia, unspecified: Secondary | ICD-10-CM

## 2013-02-27 DIAGNOSIS — F329 Major depressive disorder, single episode, unspecified: Secondary | ICD-10-CM

## 2013-02-27 DIAGNOSIS — F32A Depression, unspecified: Secondary | ICD-10-CM

## 2013-02-27 DIAGNOSIS — M329 Systemic lupus erythematosus, unspecified: Secondary | ICD-10-CM

## 2013-02-27 MED ORDER — LORAZEPAM 0.5 MG PO TABS: 0.5000 mg | ORAL_TABLET | Freq: Two times a day (BID) | ORAL | Status: DC | PRN

## 2013-02-27 MED ORDER — OXYCODONE HCL 5 MG PO TABS: 5.0000 mg | ORAL_TABLET | Freq: Four times a day (QID) | ORAL | Status: DC | PRN

## 2013-02-27 MED ORDER — METHYLPREDNISOLONE (PAK) 4 MG PO TABS: ORAL_TABLET | ORAL | Status: DC

## 2013-02-27 NOTE — Progress Notes (Signed)
Pre visit review using our clinic review tool, if applicable. No additional management support is needed unless otherwise documented below in the visit note. 

## 2013-02-27 NOTE — Patient Instructions (Signed)
Lupus Lupus (also called systemic lupus erythematosus, SLE) is a disorder of the body's natural defense system (immune system). In lupus, the immune system attacks various areas of the body (autoimmune disease). CAUSES The cause is unknown. However, lupus runs in families. Certain genes can make you more likely to develop lupus. It is 10 times more common in women than in men. Lupus is also more common in African Americans and Asians. Other factors also play a role, such as viruses (Epstein-Barr virus, EBV), stress, hormones, cigarette smoke, and certain drugs. SYMPTOMS Lupus can affect many parts of the body, including the joints, skin, kidneys, lungs, heart, nervous system, and blood vessels. The signs and symptoms of lupus differ from person to person. The disease can range from mild to life-threatening. Typical features of lupus include:  Butterfly-shaped rash over the face.  Arthritis involving one or more joints.  Kidney disease.  Fever, weight loss, hair loss, fatigue.  Poor circulation in the fingers and toes (Raynaud's disease).  Chest pain when taking deep breaths. Abdominal pain may also occur.  Skin rash in areas exposed to the sun.  Sores in the mouth and nose. DIAGNOSIS Diagnosing lupus can take a long time and is often difficult. An exam and an accurate account of your symptoms and health problems is very important. Blood tests are necessary, though no single test can confirm or rule out lupus. Most people with lupus test positive for antinuclear antibodies (ANA) on a blood test. Additional blood tests, a urine test (urinalysis), and sometimes a kidney or skin tissue sample (biopsy) can help to confirm or rule out lupus. TREATMENT There is no cure for lupus. Your caregiver will develop a treatment plan based on your age, sex, health, symptoms, and lifestyle. The goals are to prevent flares, to treat them when they do occur, and to minimize organ damage and complications. How  the disease may affect each person varies widely. Most people with lupus can live normal lives, but this disorder must be carefully monitored. Treatment must be adjusted as necessary to prevent serious complications. Medicines used for treatment:  Nonsteroidal anti-inflammatory drugs (NSAIDs) decrease inflammation and can help with chest pain, joint pain, and fevers. Examples include ibuprofen and naproxen.  Antimalarial drugs were designed to treat malaria. They also treat fatigue, joint pain, skin rashes, and inflammation of the lungs in patients with lupus.  Corticosteroids are powerful hormones that rapidly suppress inflammation. The lowest dose with the highest benefit will be chosen. They can be given by cream, pills, injections, and through the vein (intravenously).  Immunosuppressive drugs block the making of immune cells. They may be used for kidney or nerve disease. HOME CARE INSTRUCTIONS  Exercise. Low-impact activities can usually help keep joints flexible without being too strenuous.  Rest after periods of exercise.  Avoid excessive sun exposure.  Follow proper nutrition and take supplements as recommended by your caregiver.  Stress management can be helpful. SEEK MEDICAL CARE IF:  You have increased fatigue.  You develop pain.  You develop a rash.  You have an oral temperature above 102 F (38.9 C).  You develop abdominal discomfort.  You develop a headache.  You experience dizziness. FOR MORE INFORMATION National Institute of Neurological Disorders and Stroke: www.ninds.nih.gov American College of Rheumatology: www.rheumatology.org National Institute of Arthritis and Musculoskeletal and Skin Diseases: www.niams.nih.gov Document Released: 01/06/2002 Document Revised: 04/10/2011 Document Reviewed: 04/29/2009 ExitCare Patient Information 2014 [employer].  

## 2013-02-28 ENCOUNTER — Telehealth: Payer: Self-pay | Admitting: Family Medicine

## 2013-02-28 NOTE — Telephone Encounter (Signed)
Relevant patient education assigned to patient using Emmi. ° °

## 2013-03-02 ENCOUNTER — Encounter: Payer: Self-pay | Admitting: Family Medicine

## 2013-03-02 NOTE — Progress Notes (Signed)
Patient ID: Rebecca Boyle, female   DOB: [birth year], 43 y.o.   MRN: [id number] DONYAE KOHN [id number] 29-Mar-1970 03/02/2013      Progress Note-Follow Up  Subjective  Chief Complaint  Chief Complaint  Patient presents with  . Follow-up    4 week    HPI  Patient is a 43 year old Caucasian female who is in today for followup. She still struggling with the loss of her only grandchild. She is tearful but feels she is managing okay with her current meds. She is having a lupus flare and is awaiting an appointment with [facility] rheumatology program. She describes diffuse pain in joints and muscles as well. Denies rash, fevers, headache, chest pain, palpitations or shortness of breath. No GI or GU complaints.  Past Medical History  Diagnosis Date  . Arthritis     hands, hips, knees, back feet  . Depression   . Lupus 2004  . PONV (postoperative nausea and vomiting)   . Syncope, cardiogenic 2002    treated with toprol  . Anxiety   . Heart murmur   . Chicken pox as a child  . Hypertension   . Tachycardia 06/29/2011  . Anxiety and depression 01/04/2011  . Headache disorder 05/16/2011  . Pain, dental 07/19/2011  . Pedal edema 08/14/2011  . Sinusitis acute 08/29/2011  . Knee pain, right 11/12/2011  . UTI (lower urinary tract infection) 11/27/2011  . Preventative health care 07/07/2012    Past Surgical History  Procedure Laterality Date  . Cholecystectomy  2005  . Breast surgery  1995    lumpectomy of left breast- benign  . Right wrist nerve repair - 2008  2008    fell on nail, repair  . Abdominal hysterectomy  2010    had uterus left ovary removed 2010, right ovary removed 8/12    Family History  Problem Relation Age of Onset  . Hyperlipidemia Mother   . Hypertension Mother   . Stroke Mother     X 5 mini  . Cancer Mother 5    cervical  . Heart disease Mother   . Hypertension Father   . Heart disease Father     cad with stent  . Arthritis Father     2  blown discs  . Stroke Maternal Grandmother   . Lupus Maternal Grandmother   . Heart disease Paternal Grandfather     History   Social History  . Marital Status: Legally Separated    Spouse Name: N/A    Number of Children: N/A  . Years of Education: college   Occupational History  . Not on file.   Social History Main Topics  . Smoking status: Current Some Day Smoker -- 0.25 packs/day for 10 years    Types: Cigarettes  . Smokeless tobacco: Never Used  . Alcohol Use: No  . Drug Use: No  . Sexual Activity: No   Other Topics Concern  . 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    Smokes 2 cigarettes a day   Denies ETOH    Current Outpatient Prescriptions on File Prior to Visit  Medication Sig Dispense Refill  . albuterol (PROVENTIL HFA;VENTOLIN HFA) 108 (90 BASE) MCG/ACT inhaler Inhale 2 puffs into the lungs every 4 (four) hours as needed for wheezing. For exercise  induced asthma  1 Inhaler  5  . esomeprazole (NEXIUM) 40 MG capsule Take 1 capsule (40 mg total) by mouth daily before breakfast.  30 capsule  2  . Estradiol 0.75 MG/1.25 GM (0.06%) topical gel Place 1.25 g onto the skin daily.  1 Bottle  12  . hydrochlorothiazide (MICROZIDE) 12.5 MG capsule Take 1 capsule (12.5 mg total) by mouth daily.  30 capsule  4  . hydroxychloroquine (PLAQUENIL) 200 MG tablet Take 100 mg by mouth daily.      Marland Kitchen lisinopril (PRINIVIL,ZESTRIL) 10 MG tablet Take 1 tablet (10 mg total) by mouth 2 (two) times daily.  60 tablet  3  . metoprolol succinate (TOPROL-XL) 100 MG 24 hr tablet Take 1 tablet (100 mg total) by mouth daily. In PM  30 tablet  4  . metoprolol succinate (TOPROL-XL) 50 MG 24 hr tablet Take 1 tablet (50 mg total) by mouth daily. In AM  30 tablet  3  . Multiple Vitamin (MULITIVITAMIN WITH MINERALS) TABS Take 1 tablet by mouth daily.       Marland Kitchen OLANZapine (ZYPREXA) 2.5 MG tablet Take 5 mg by mouth at bedtime.      . potassium chloride (K-DUR) 10 MEQ tablet Take 1 tablet (10 mEq total) by mouth daily.  30 tablet  4  . Probiotic Product (MISC INTESTINAL FLORA REGULAT) CHEW Digestive Health probiotic gummies by Schiff daily  60 tablet  5  . venlafaxine XR (EFFEXOR XR) 75 MG 24 hr capsule Take 2 capsules (150 mg total) by mouth daily.  60 capsule  2   No current facility-administered medications on file prior to visit.    Allergies  Allergen Reactions  . Morphine And Related Shortness Of Breath and Itching  . Tylenol [Acetaminophen] Rash    Pt states she has liver damage, and is also taking plaquenil  . Codeine Itching and Nausea And Vomiting    Review of Systems  Review of Systems  Constitutional: Negative for fever and malaise/fatigue.  HENT: Negative for congestion.   Eyes: Negative for discharge.  Respiratory: Negative for shortness of breath.   Cardiovascular: Negative for chest pain, palpitations and leg swelling.  Gastrointestinal: Negative for nausea, abdominal pain and diarrhea.  Genitourinary: Negative for dysuria.  Musculoskeletal: Negative for falls.  Skin: Negative for rash.  Neurological: Negative for loss of consciousness and headaches.  Endo/Heme/Allergies: Negative for polydipsia.  Psychiatric/Behavioral: Positive for depression. Negative for suicidal ideas. The patient is nervous/anxious. The patient does not have insomnia.     Objective  BP 110/84  Pulse 78  Temp(Src) 98 F (36.7 C) (Oral)  Ht 5' 5.5" (1.664 m)  Wt 169 lb 1.9 oz (76.712 kg)  BMI 27.70 kg/m2  SpO2 97%  Physical Exam  Physical Exam  Constitutional: She is oriented to person, place, and time and well-developed, well-nourished, and in no distress. No distress.  HENT:  Head: Normocephalic and atraumatic.  Eyes: Conjunctivae are normal.  Neck: Neck supple. No thyromegaly present.  Cardiovascular: Normal rate, regular rhythm  and normal heart sounds.   No murmur heard. Pulmonary/Chest: Effort normal and breath sounds normal. She has no wheezes.  Abdominal: She exhibits no distension and no mass.  Musculoskeletal: She exhibits no edema.  Lymphadenopathy:    She has no cervical adenopathy.  Neurological: She is alert and oriented to person, place, and time.  Skin: Skin is warm and dry. No rash noted. She is not diaphoretic.  Psychiatric: Memory, affect and judgment normal.  tearful  Lab Results  Component Value Date   TSH 0.44 06/29/2011   Lab Results  Component Value Date   WBC 8.2 01/02/2013   HGB 12.7 01/02/2013   HCT 36.4 01/02/2013   MCV 91.2 01/02/2013   PLT 248 01/02/2013   Lab Results  Component Value Date   CREATININE 0.87 01/02/2013   BUN 17 01/02/2013   NA 140 01/02/2013   K 3.5 01/02/2013   CL 103 01/02/2013   CO2 25 01/02/2013   Lab Results  Component Value Date   ALT 56* 06/29/2011   AST 35 06/29/2011   ALKPHOS 82 06/29/2011   BILITOT 0.3 06/29/2011   Lab Results  Component Value Date   CHOL 205* 06/29/2011   Lab Results  Component Value Date   HDL 47.30 06/29/2011   No results found for this basename: [facility]   Lab Results  Component Value Date   TRIG 111.0 06/29/2011   Lab Results  Component Value Date   CHOLHDL 4 06/29/2011     Assessment & Plan  SLE (systemic lupus erythematosus) Previously seen by Dr Jon Billingsevishwar now [facility]. Experiencing a pain crisis. Given a medrol dosepak and a small amount of Oxycodone to use sparingly. We have notified patinet that she will need to obtain her pain meds from Rheumtology once they take over her care.   Anxiety and depression Still struggling with grief over loosing her only grand child. She feels the Zyprexa has helped some with the increase and she is tolerating her effexor. No further changes today.  Tachycardia Well controlled, no changes  Tobacco use disorder Unfortunately continues to smoke,  encouraged to attempt complete cessation.

## 2013-03-02 NOTE — Assessment & Plan Note (Signed)
Previously seen by Dr Jon Billingsevishwar now [facility] rheumatology soon. Experiencing a pain crisis. Given a medrol dosepak and a small amount of Oxycodone to use sparingly. We have notified patinet that she will need to obtain her pain meds from Rheumtology once they take over her care.

## 2013-03-02 NOTE — Assessment & Plan Note (Signed)
Still struggling with grief over loosing her only grand child. She feels the Zyprexa has helped some with the increase and she is tolerating her effexor. No further changes today.

## 2013-03-02 NOTE — Assessment & Plan Note (Addendum)
Unfortunately continues to smoke, encouraged to attempt complete cessation. Counseled for 3+ minutes

## 2013-03-02 NOTE — Assessment & Plan Note (Signed)
Well controlled, no changes 

## 2013-03-26 ENCOUNTER — Telehealth: Payer: Self-pay

## 2013-03-26 NOTE — Telephone Encounter (Signed)
FYIHubert Azure:  [facility] called and states pt is supposed to have dental surgery tomorrow (weather permitting). Pt states she is not in a controlled substance contract with Dr Abner GreenspanBlyth or a pain clinic.   I spoke to Dr Abner GreenspanBlyth and she gave me a verbal that pt is correct.  The surgeons office also stated that the dental office that recommended the patient to them called to worn the surgeon that pt seems to be "after" pain medication and will state she can't take Hydrocodone only Oxy.

## 2013-03-28 ENCOUNTER — Telehealth: Payer: Self-pay | Admitting: Family Medicine

## 2013-03-28 MED ORDER — OLANZAPINE 2.5 MG PO TABS: 5.0000 mg | ORAL_TABLET | Freq: Every day | ORAL | Status: DC

## 2013-03-28 NOTE — Telephone Encounter (Signed)
Refill sent.

## 2013-03-28 NOTE — Telephone Encounter (Signed)
OK to send zyprex 2.5 mg tabs- two tabs at bedtime, #60 [facility] [place]

## 2013-03-28 NOTE — Telephone Encounter (Signed)
She was in on the 29th and Dr B [facility] [place]  [facility] say [facility] never received the RX  Patient is out and requests your [facility] [place]  She does not want to deal with the [facility] store as there have been several problems

## 2013-04-07 ENCOUNTER — Ambulatory Visit: Payer: BC Managed Care – PPO | Admitting: Family Medicine

## 2013-04-12 ENCOUNTER — Other Ambulatory Visit: Payer: Self-pay | Admitting: Family Medicine

## 2013-04-23 ENCOUNTER — Telehealth: Payer: Self-pay | Admitting: Family Medicine

## 2013-04-23 DIAGNOSIS — F329 Major depressive disorder, single episode, unspecified: Secondary | ICD-10-CM

## 2013-04-23 DIAGNOSIS — F419 Anxiety disorder, unspecified: Principal | ICD-10-CM

## 2013-04-23 MED ORDER — LORAZEPAM 0.5 MG PO TABS
0.5000 mg | ORAL_TABLET | Freq: Two times a day (BID) | ORAL | Status: DC | PRN
Start: 2013-04-23 — End: 2013-06-17

## 2013-04-23 NOTE — Telephone Encounter (Signed)
Patient is requesting a new rx of ativan

## 2013-04-23 NOTE — Telephone Encounter (Signed)
Last rx provided on 02/27/13, #60 x 1 refill.  Next refill should not be due until 04/27/13. Printed Rx with notation not to fill before 04/27/13 and forwarded to Provider for signature.

## 2013-04-24 NOTE — Telephone Encounter (Signed)
Faxed RX 

## 2013-05-06 ENCOUNTER — Encounter (INDEPENDENT_AMBULATORY_CARE_PROVIDER_SITE_OTHER): Payer: BC Managed Care – PPO | Admitting: General Surgery

## 2013-05-06 ENCOUNTER — Ambulatory Visit (INDEPENDENT_AMBULATORY_CARE_PROVIDER_SITE_OTHER): Payer: BC Managed Care – PPO | Admitting: Family

## 2013-05-06 ENCOUNTER — Encounter: Payer: Self-pay | Admitting: Family

## 2013-05-06 ENCOUNTER — Telehealth: Payer: Self-pay | Admitting: Family Medicine

## 2013-05-06 VITALS — BP 130/84 | HR 56 | Temp 98.4°F | Wt 175.0 lb

## 2013-05-06 DIAGNOSIS — F419 Anxiety disorder, unspecified: Secondary | ICD-10-CM

## 2013-05-06 DIAGNOSIS — M329 Systemic lupus erythematosus, unspecified: Secondary | ICD-10-CM

## 2013-05-06 DIAGNOSIS — F329 Major depressive disorder, single episode, unspecified: Secondary | ICD-10-CM

## 2013-05-06 DIAGNOSIS — L0591 Pilonidal cyst without abscess: Secondary | ICD-10-CM

## 2013-05-06 DIAGNOSIS — F341 Dysthymic disorder: Secondary | ICD-10-CM

## 2013-05-06 DIAGNOSIS — IMO0002 Reserved for concepts with insufficient information to code with codable children: Secondary | ICD-10-CM

## 2013-05-06 MED ORDER — CEPHALEXIN 500 MG PO CAPS: 500.0000 mg | ORAL_CAPSULE | Freq: Three times a day (TID) | ORAL | Status: DC

## 2013-05-06 MED ORDER — OLANZAPINE 5 MG PO TABS: 5.0000 mg | ORAL_TABLET | Freq: Every day | ORAL | Status: DC

## 2013-05-06 MED ORDER — OXYCODONE HCL 5 MG PO TABS: 5.0000 mg | ORAL_TABLET | Freq: Four times a day (QID) | ORAL | Status: DC | PRN

## 2013-05-06 NOTE — Progress Notes (Signed)
Subjective:    Patient ID: Rebecca Boyle, female    DOB: 1970/10/04, 43 y.o.   MRN: [id number]  HPI  Ms. Sigmund is a 43 yr old female who presents today with concerns re: cyst.  Reports that she has had for 1 year.  Reports  That the  "Cyst" it has started weeping, running down.  Trouble sitting due to pain.  Reports flare up of her lupus which is causing joint pain.    Depression- lost her grandchild a few months back. The child was born with trisomy 16 and only lived for 5 minutes. She is helping her daughter and they are both grieving from this loss. Requesting refill on zyprex.    Review of Systems See HPI  Past Medical History  Diagnosis Date  . Arthritis     hands, hips, knees, back feet  . Depression   . Lupus 2004  . PONV (postoperative nausea and vomiting)   . Syncope, cardiogenic 2002    treated with toprol  . Anxiety   . Heart murmur   . Chicken pox as a child  . Hypertension   . Tachycardia 06/29/2011  . Anxiety and depression 01/04/2011  . Headache disorder 05/16/2011  . Pain, dental 07/19/2011  . Pedal edema 08/14/2011  . Sinusitis acute 08/29/2011  . Knee pain, right 11/12/2011  . UTI (lower urinary tract infection) 11/27/2011  . Preventative health care 07/07/2012    History   Social History  . Marital Status: Legally Separated    Spouse Name: N/A    Number of Children: N/A  . Years of Education: college   Occupational History  . Not on file.   Social History Main Topics  . Smoking status: Current Some Day Smoker -- 0.25 packs/day for 10 years    Types: Cigarettes  . Smokeless tobacco: Never Used  . Alcohol Use: No  . Drug Use: No  . Sexual Activity: No   Other Topics Concern  . 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    Smokes 2 cigarettes a day   Denies ETOH    Past Surgical History   Procedure Laterality Date  . Cholecystectomy  2005  . Breast surgery  1995    lumpectomy of left breast- benign  . Right wrist nerve repair - 2008  2008    fell on nail, repair  . Abdominal hysterectomy  2010    had uterus left ovary removed 2010, right ovary removed 8/12    Family History  Problem Relation Age of Onset  . Hyperlipidemia Mother   . Hypertension Mother   . Stroke Mother     X 5 mini  . Cancer Mother 59    cervical  . Heart disease Mother   . Hypertension Father   . Heart disease Father     cad with stent  . Arthritis Father     2 blown discs  . Stroke Maternal Grandmother   . Lupus Maternal Grandmother   . Heart disease Paternal Grandfather     Allergies  Allergen Reactions  . Morphine And Related Shortness Of Breath and Itching  . Tylenol [Acetaminophen] Rash    Pt states she has liver damage, and is also taking plaquenil  . Codeine Itching and Nausea And Vomiting  Current Outpatient Prescriptions on File Prior to Visit  Medication Sig Dispense Refill  . albuterol (PROVENTIL HFA;VENTOLIN HFA) 108 (90 BASE) MCG/ACT inhaler Inhale 2 puffs into the lungs every 4 (four) hours as needed for wheezing. For exercise induced asthma  1 Inhaler  5  . esomeprazole (NEXIUM) 40 MG capsule Take 1 capsule (40 mg total) by mouth daily before breakfast.  30 capsule  2  . Estradiol 0.75 MG/1.25 GM (0.06%) topical gel Place 1.25 g onto the skin daily.  1 Bottle  12  . hydrochlorothiazide (MICROZIDE) 12.5 MG capsule Take 1 capsule (12.5 mg total) by mouth daily.  30 capsule  4  . hydroxychloroquine (PLAQUENIL) 200 MG tablet Take 100 mg by mouth daily.      Marland Kitchen. lisinopril (PRINIVIL,ZESTRIL) 10 MG tablet Take 1 tablet (10 mg total) by mouth 2 (two) times daily.  60 tablet  3  . LORazepam (ATIVAN) 0.5 MG tablet Take 1 tablet (0.5 mg total) by mouth 2 (two) times daily as needed for anxiety or sleep.  60 tablet  1  . methylPREDNIsolone (MEDROL DOSPACK) 4 MG tablet follow  package directions  21 tablet  1  . metoprolol succinate (TOPROL-XL) 100 MG 24 hr tablet Take 1 tablet (100 mg total) by mouth daily. In PM  30 tablet  4  . metoprolol succinate (TOPROL-XL) 50 MG 24 hr tablet Take 1 tablet (50 mg total) by mouth daily. In AM  30 tablet  3  . Multiple Vitamin (MULITIVITAMIN WITH MINERALS) TABS Take 1 tablet by mouth daily.      Marland Kitchen. OLANZapine (ZYPREXA) 2.5 MG tablet Take 2 tablets (5 mg total) by mouth at bedtime.  60 tablet  0  . oxyCODONE (ROXICODONE) 5 MG immediate release tablet Take 1 tablet (5 mg total) by mouth every 6 (six) hours as needed for severe pain.  30 tablet  0  . potassium chloride (K-DUR) 10 MEQ tablet Take 1 tablet (10 mEq total) by mouth daily.  30 tablet  4  . Probiotic Product (MISC INTESTINAL FLORA REGULAT) CHEW Digest[facility] [employer] probiotic gummies by Schiff daily  60 tablet  5  . SUMAtriptan (IMITREX) 50 MG tablet TAKE 1 TABLET (50 MG TOTAL) BY MOUTH EVERY 2 (TWO) HOURS AS NEEDED FOR MIGRAINE.  9 tablet  0  . venlafaxine XR (EFFEXOR XR) 75 MG 24 hr capsule Take 2 capsules (150 mg total) by mouth daily.  60 capsule  2   No current facility-administered medications on file prior to visit.    BP 130/84  Pulse 56  Temp(Src) 98.4 F (36.9 C)  Wt 175 lb (79.379 kg)  SpO2 97%       Objective:   Physical Exam  Constitutional: She is oriented to person, place, and time. She appears well-developed and well-nourished. No distress.  Neurological: She is alert and oriented to person, place, and time.  Skin:  Tender area above anus in gluteal fold without significant induration or erythema.    Psychiatric: She has a normal mood and affect. Her behavior is normal. Judgment and thought content normal.          Assessment & Plan:

## 2013-05-06 NOTE — Assessment & Plan Note (Signed)
Reports flare up of joint pain.  Requests refill of pain med.  30 tabs oxycodone provided to pt.

## 2013-05-06 NOTE — Telephone Encounter (Signed)
Relevant patient education assigned to patient using Emmi. ° °

## 2013-05-06 NOTE — Assessment & Plan Note (Signed)
Doing ok on zyprexa, requests refill.

## 2013-05-06 NOTE — Assessment & Plan Note (Signed)
Will refer to surgeon for further evaluation. Rx with keflex.

## 2013-05-06 NOTE — Patient Instructions (Addendum)
Please start keflex, you will be contacted about your referral to the surgeon. Call if symptoms worsen, or if they do not improve prior to your visit with the surgeon.

## 2013-05-07 ENCOUNTER — Telehealth: Payer: Self-pay | Admitting: Family Medicine

## 2013-05-07 NOTE — Telephone Encounter (Signed)
Noted! Thank you

## 2013-05-07 NOTE — Telephone Encounter (Signed)
Referral was scheduled w/ [facility] for yesterday. I have been unable to contact patient, have left multiple messages with no return call. Was told yesterday to wait out front for referral info, patient told front desk that she was going to gets meds filled in the pharmacy and would be back up, patient never returned. Did send patient an email.

## 2013-05-26 ENCOUNTER — Other Ambulatory Visit: Payer: Self-pay | Admitting: Family Medicine

## 2013-05-26 NOTE — Telephone Encounter (Signed)
Pt was last seen by Dr Abner GreenspanBlyth in January and advised a 6 week f/u which would have been around the middle of March.  Please call pt to arrange follow up. 30 day supply x 1 refill of metoprolol sent to pharmacy.

## 2013-05-28 ENCOUNTER — Telehealth: Payer: Self-pay | Admitting: Family Medicine

## 2013-05-28 NOTE — Telephone Encounter (Signed)
Requesting refill of Imitrex.

## 2013-05-29 ENCOUNTER — Telehealth: Payer: Self-pay | Admitting: Family Medicine

## 2013-05-29 DIAGNOSIS — N390 Urinary tract infection, site not specified: Secondary | ICD-10-CM

## 2013-05-29 MED ORDER — SACCHAROMYCES BOULARDII 250 MG PO CAPS: 250.0000 mg | ORAL_CAPSULE | Freq: Every day | ORAL | Status: DC

## 2013-05-29 MED ORDER — SUMATRIPTAN SUCCINATE 50 MG PO TABS: 50.0000 mg | ORAL_TABLET | ORAL | Status: DC | PRN

## 2013-05-29 NOTE — Telephone Encounter (Signed)
I will allow her Voltaren gel 4 gram 4 x daily to knees prn pain, disp# 5 tubes, 3 rf. We may get kick back with prior auth.

## 2013-05-29 NOTE — Telephone Encounter (Signed)
Can send  In rx for Florastor 1 cap daily, disp #30 with 5 rf. Voltaren gel for what? Knee pain? If so I will write need to know what else she has tried? To get it approved

## 2013-05-29 NOTE — Telephone Encounter (Signed)
Imitrex is already done.  Please advise what probiotic can be ordered  Please advise voltaren gel? I don't see this on pts med list or med history list?

## 2013-05-29 NOTE — Telephone Encounter (Signed)
RX sent

## 2013-05-29 NOTE — Telephone Encounter (Signed)
Appointment scheduled for 06/17/13 °

## 2013-05-29 NOTE — Telephone Encounter (Signed)
Left detailed message informing patient of medication refill and to call our office to schedule appointment °

## 2013-05-29 NOTE — Telephone Encounter (Signed)
Patient states she uses the voltaren gel (got this from someone awhile ago) for her joint pain (knees, ankles, hands) since she is no longer taking the pain medication.  Please advise?

## 2013-05-29 NOTE — Telephone Encounter (Signed)
Refill-probiotic  Refill-voltaren gel  Refill-sumatriptan

## 2013-05-30 MED ORDER — DICLOFENAC SODIUM 1 % TD GEL: 4.0000 g | Freq: Four times a day (QID) | TRANSDERMAL | Status: DC | PRN

## 2013-06-03 ENCOUNTER — Telehealth: Payer: Self-pay | Admitting: Family Medicine

## 2013-06-03 MED ORDER — OLANZAPINE 5 MG PO TABS: 5.0000 mg | ORAL_TABLET | Freq: Every day | ORAL | Status: DC

## 2013-06-03 NOTE — Telephone Encounter (Signed)
Requesting refill on OLANZapine (ZYPREXA) 5 MG tablet

## 2013-06-16 ENCOUNTER — Other Ambulatory Visit: Payer: Self-pay | Admitting: Family Medicine

## 2013-06-17 ENCOUNTER — Encounter: Payer: Self-pay | Admitting: Family Medicine

## 2013-06-17 ENCOUNTER — Ambulatory Visit (INDEPENDENT_AMBULATORY_CARE_PROVIDER_SITE_OTHER): Payer: BC Managed Care – PPO | Admitting: Family Medicine

## 2013-06-17 VITALS — BP 118/82 | HR 62 | Temp 99.0°F | Ht 65.5 in | Wt 171.0 lb

## 2013-06-17 DIAGNOSIS — K219 Gastro-esophageal reflux disease without esophagitis: Secondary | ICD-10-CM

## 2013-06-17 DIAGNOSIS — F419 Anxiety disorder, unspecified: Secondary | ICD-10-CM

## 2013-06-17 DIAGNOSIS — I10 Essential (primary) hypertension: Secondary | ICD-10-CM

## 2013-06-17 DIAGNOSIS — K089 Disorder of teeth and supporting structures, unspecified: Secondary | ICD-10-CM

## 2013-06-17 DIAGNOSIS — F329 Major depressive disorder, single episode, unspecified: Secondary | ICD-10-CM

## 2013-06-17 DIAGNOSIS — R000 Tachycardia, unspecified: Secondary | ICD-10-CM

## 2013-06-17 DIAGNOSIS — F341 Dysthymic disorder: Secondary | ICD-10-CM

## 2013-06-17 DIAGNOSIS — M329 Systemic lupus erythematosus, unspecified: Secondary | ICD-10-CM

## 2013-06-17 MED ORDER — LISINOPRIL 10 MG PO TABS: 10.0000 mg | ORAL_TABLET | Freq: Two times a day (BID) | ORAL | Status: DC

## 2013-06-17 MED ORDER — ESOMEPRAZOLE MAGNESIUM 40 MG PO CPDR: 40.0000 mg | DELAYED_RELEASE_CAPSULE | Freq: Every day | ORAL | Status: DC

## 2013-06-17 MED ORDER — OLANZAPINE 10 MG PO TABS: 10.0000 mg | ORAL_TABLET | Freq: Every day | ORAL | Status: DC

## 2013-06-17 MED ORDER — LORAZEPAM 0.5 MG PO TABS: 0.5000 mg | ORAL_TABLET | Freq: Two times a day (BID) | ORAL | Status: DC | PRN

## 2013-06-17 MED ORDER — VENLAFAXINE HCL ER 75 MG PO CP24
150.0000 mg | ORAL_CAPSULE | Freq: Every day | ORAL | Status: DC
Start: 2013-06-17 — End: 2013-10-02

## 2013-06-17 NOTE — Progress Notes (Signed)
Pre visit review using our clinic review tool, if applicable. No additional management support is needed unless otherwise documented below in the visit note. 

## 2013-06-17 NOTE — Telephone Encounter (Signed)
RX was wrote on 04-23-13 (with a do not fill until 04-27-13) quantity 60 with 1 refill  This is not due until 06-27-13

## 2013-06-17 NOTE — Patient Instructions (Signed)

## 2013-06-22 ENCOUNTER — Encounter: Payer: Self-pay | Admitting: Family Medicine

## 2013-06-22 DIAGNOSIS — K089 Disorder of teeth and supporting structures, unspecified: Secondary | ICD-10-CM | POA: Insufficient documentation

## 2013-06-22 NOTE — Assessment & Plan Note (Signed)
Well controlled, no changes to meds. Encouraged heart healthy diet such as the DASH diet and exercise as tolerated.  °

## 2013-06-22 NOTE — Assessment & Plan Note (Signed)
Her daughter is doing better and she has responded well to Zyprexa will continue current meds

## 2013-06-22 NOTE — Assessment & Plan Note (Signed)
Well controlled on current mes.

## 2013-06-22 NOTE — Progress Notes (Signed)
Patient ID: Rebecca Boyle, female   DOB: 07/05/1970, 43 y.o.   MRN: [id number] Rebecca Boyle [id number] 04/28/1970 06/22/2013      Progress Note-Follow Up  Subjective  Chief Complaint  Chief Complaint  Patient presents with  . Medication Refill    HPI  Patient is a 43 year old female in today for routine medical care. Patient  Denies CP/palp/SOB/HA/congestion/fevers/GI or GU c/o. Taking meds as prescribedis in today for followup. Is doing fairly well with the Zyprexa. Her daughter is doing better so her anxiety and depression are all improved. Her biggest concern presently is dental. She has chronic dental pain essentially scheduled to have all of her teeth pulled, bone grafts placed in implants and falls. No fevers or chills no signs of systemic illness at the present time.  Past Medical History  Diagnosis Date  . Arthritis     hands, hips, knees, back feet  . Depression   . Lupus 2004  . PONV (postoperative nausea and vomiting)   . Syncope, cardiogenic 2002    treated with toprol  . Anxiety   . Heart murmur   . Chicken pox as a child  . Hypertension   . Tachycardia 06/29/2011  . Anxiety and depression 01/04/2011  . Headache disorder 05/16/2011  . Pain, dental 07/19/2011  . Pedal edema 08/14/2011  . Sinusitis acute 08/29/2011  . Knee pain, right 11/12/2011  . UTI (lower urinary tract infection) 11/27/2011  . Preventative health care 07/07/2012  . Poor dentition 06/22/2013    Past Surgical History  Procedure Laterality Date  . Cholecystectomy  2005  . Breast surgery  1995    lumpectomy of left breast- benign  . Right wrist nerve repair - 2008  2008    fell on nail, repair  . Abdominal hysterectomy  2010    had uterus left ovary removed 2010, right ovary removed 8/12    Family History  Problem Relation Age of Onset  . Hyperlipidemia Mother   . Hypertension Mother   . Stroke Mother     X 5 mini  . Cancer Mother [id number]    cervical  . Heart disease Mother   .  Hypertension Father   . Heart disease Father     cad with stent  . Arthritis Father     2 blown discs  . Stroke Maternal Grandmother   . Lupus Maternal Grandmother   . Heart disease Paternal Grandfather     History   Social History  . Marital Status: Legally Separated    Spouse Name: N/A    Number of Children: N/A  . Years of Education: college   Occupational History  . Not on file.   Social History Main Topics  . Smoking status: Current Some Day Smoker -- 0.25 packs/day for 10 years    Types: Cigarettes  . Smokeless tobacco: Never Used  . Alcohol Use: No  . Drug Use: No  . Sexual Activity: No   Other Topics Concern  . 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    Smokes 2 cigarettes a day   Denies ETOH    Current Outpatient Prescriptions on File Prior to Visit  Medication Sig Dispense Refill  . albuterol (PROVENTIL HFA;VENTOLIN HFA) 108 (90 BASE) MCG/ACT inhaler Inhale 2 puffs into the  lungs every 4 (four) hours as needed for wheezing. For exercise induced asthma  1 Inhaler  5  . diclofenac sodium (VOLTAREN) 1 % GEL Apply 4 g topically 4 (four) times daily as needed.  5 Tube  3  . Estradiol 0.75 MG/1.25 GM (0.06%) topical gel Place 1.25 g onto the skin daily.  1 Bottle  12  . hydroxychloroquine (PLAQUENIL) 200 MG tablet Take 100 mg by mouth daily.      . metoprolol succinate (TOPROL-XL) 100 MG 24 hr tablet Take 1 tablet (100 mg total) by mouth daily. In PM  30 tablet  4  . metoprolol succinate (TOPROL-XL) 50 MG 24 hr tablet TAKE 1 TABLET (50 MG TOTAL) BY MOUTH DAILY IN THE MORNING  30 tablet  1  . Multiple Vitamin (MULITIVITAMIN WITH MINERALS) TABS Take 1 tablet by mouth daily.      Marland Kitchen oxyCODONE (ROXICODONE) 5 MG immediate release tablet Take 1 tablet (5 mg total) by mouth every 6 (six) hours as needed for severe pain.   30 tablet  0  . saccharomyces boulardii (FLORASTOR) 250 MG capsule Take 1 capsule (250 mg total) by mouth daily.  30 capsule  5  . SUMAtriptan (IMITREX) 50 MG tablet Take 1 tablet (50 mg total) by mouth every 2 (two) hours as needed for migraine or headache. May repeat in 2 hours if headache persists or recurs.  9 tablet  0   No current facility-administered medications on file prior to visit.    Allergies  Allergen Reactions  . Morphine And Related Shortness Of Breath and Itching  . Tylenol [Acetaminophen] Rash    Pt states she has liver damage, and is also taking plaquenil  . Codeine Itching and Nausea And Vomiting    Review of Systems  Review of Systems  Constitutional: Negative for fever and malaise/fatigue.  HENT: Negative for congestion.   Eyes: Negative for discharge.  Respiratory: Negative for shortness of breath.   Cardiovascular: Negative for chest pain, palpitations and leg swelling.  Gastrointestinal: Negative for nausea, abdominal pain and diarrhea.  Genitourinary: Negative for dysuria.  Musculoskeletal: Positive for back pain and myalgias. Negative for falls.  Skin: Negative.  Negative for rash.  Neurological: Negative for loss of consciousness and headaches.  Endo/Heme/Allergies: Negative for polydipsia.  Psychiatric/Behavioral: Negative for depression and suicidal ideas. The patient is not nervous/anxious and does not have insomnia.     Objective  BP 118/82  Pulse 62  Temp(Src) 99 F (37.2 C) (Oral)  Ht 5' 5.5" (1.664 m)  Wt 171 lb 0.6 oz (77.583 kg)  BMI 28.02 kg/m2  SpO2 96%  Physical Exam  Physical Exam  Constitutional: She is oriented to person, place, and time and well-developed, well-nourished, and in no distress. No distress.  HENT:  Head: Normocephalic and atraumatic.  Eyes: Conjunctivae are normal.  Neck: Neck supple. No thyromegaly present.  Cardiovascular: Normal rate, regular rhythm and normal heart sounds.   No murmur  heard. Pulmonary/Chest: Effort normal and breath sounds normal. She has no wheezes.  Abdominal: She exhibits no distension and no mass.  Musculoskeletal: She exhibits no edema.  Lymphadenopathy:    She has no cervical adenopathy.  Neurological: She is alert and oriented to person, place, and time.  Skin: Skin is warm and dry. No rash noted. She is not diaphoretic.  Psychiatric: Memory, affect and judgment normal.    Lab Results  Component Value Date   TSH 0.44 06/29/2011   Lab Results  Component Value Date  WBC 8.2 01/02/2013   HGB 12.7 01/02/2013   HCT 36.4 01/02/2013   MCV 91.2 01/02/2013   PLT 248 01/02/2013   Lab Results  Component Value Date   CREATININE 0.87 01/02/2013   BUN 17 01/02/2013   NA 140 01/02/2013   K 3.5 01/02/2013   CL 103 01/02/2013   CO2 25 01/02/2013   Lab Results  Component Value Date   ALT 56* 06/29/2011   AST 35 06/29/2011   ALKPHOS 82 06/29/2011   BILITOT 0.3 06/29/2011   Lab Results  Component Value Date   CHOL 205* 06/29/2011   Lab Results  Component Value Date   HDL 47.30 06/29/2011   No results found for this basename: [facility]   Lab Results  Component Value Date   TRIG 111.0 06/29/2011   Lab Results  Component Value Date   CHOLHDL 4 06/29/2011     Assessment & Plan  HTN (hypertension) Well controlled, no changes to meds. Encouraged heart healthy diet such as the DASH diet and exercise as tolerated.   Tachycardia Well controlled on current mes.   Poor dentition Is scheduled to have all of her teeth pulled, then 8 bone grafts and 4 implants and all false teeth then attached. Is both anxious and ready to proceed due to persistent pain and difficulties  Anxiety and depression Her daughter is doing better and she has responded well to Zyprexa will continue current meds  SLE (systemic lupus erythematosus) stable

## 2013-06-22 NOTE — Assessment & Plan Note (Signed)
Is scheduled to have all of her teeth pulled, then 8 bone grafts and 4 implants and all false teeth then attached. Is both anxious and ready to proceed due to persistent pain and difficulties

## 2013-06-22 NOTE — Assessment & Plan Note (Signed)
stable °

## 2013-06-24 ENCOUNTER — Telehealth: Payer: Self-pay | Admitting: Family Medicine

## 2013-06-24 DIAGNOSIS — I10 Essential (primary) hypertension: Secondary | ICD-10-CM

## 2013-06-24 MED ORDER — METOPROLOL SUCCINATE ER 50 MG PO TB24: 50.0000 mg | ORAL_TABLET | Freq: Every day | ORAL | Status: DC

## 2013-06-24 NOTE — Telephone Encounter (Signed)
[facility] [place] [facility] in [facility]. We don't have one at [address] in [place]

## 2013-06-24 NOTE — Telephone Encounter (Signed)
Refill- [facility]oprolol  [facility] [address] in [place]

## 2013-06-25 ENCOUNTER — Telehealth: Payer: Self-pay | Admitting: *Deleted

## 2013-06-25 DIAGNOSIS — I10 Essential (primary) hypertension: Secondary | ICD-10-CM

## 2013-06-25 MED ORDER — METOPROLOL SUCCINATE ER 100 MG PO TB24: 100.0000 mg | ORAL_TABLET | Freq: Every day | ORAL | Status: DC

## 2013-06-25 NOTE — Telephone Encounter (Signed)
Refill request from [facility] for metoprolol 100mg . Refill sent.

## 2013-07-10 ENCOUNTER — Telehealth: Payer: Self-pay | Admitting: Family Medicine

## 2013-07-10 MED ORDER — SUMATRIPTAN SUCCINATE 50 MG PO TABS: 50.0000 mg | ORAL_TABLET | ORAL | Status: DC | PRN

## 2013-07-10 NOTE — Telephone Encounter (Signed)
Please advise refill? Last RX was done on 05-29-13 quantity 9 with 0 refills

## 2013-07-10 NOTE — Telephone Encounter (Signed)
Refill-[facility]atriptan  [facility] [address] [facility] [place], [place]

## 2013-07-10 NOTE — Telephone Encounter (Signed)
OK to refill Sumatriptan same strength, same sig, #9 with 1 rf

## 2013-08-12 ENCOUNTER — Telehealth: Payer: Self-pay | Admitting: Family Medicine

## 2013-08-12 MED ORDER — METOPROLOL SUCCINATE ER 100 MG PO TB24: 100.0000 mg | ORAL_TABLET | Freq: Every day | ORAL | Status: DC

## 2013-08-12 NOTE — Telephone Encounter (Signed)
Refill history indicates that metoprolol 50mg  rx sent 06/24/13 should have 3 more refills. 100mg  Rx have no remaining refills. Sent refill for 100mg  Rx.

## 2013-08-12 NOTE — Telephone Encounter (Signed)
[facility]rolol  [facility] [address] [facility] [place] [place]

## 2013-08-21 ENCOUNTER — Other Ambulatory Visit: Payer: Self-pay | Admitting: Family Medicine

## 2013-09-04 ENCOUNTER — Other Ambulatory Visit: Payer: Self-pay | Admitting: Family Medicine

## 2013-09-17 ENCOUNTER — Other Ambulatory Visit: Payer: Self-pay | Admitting: Family Medicine

## 2013-09-17 MED ORDER — OLANZAPINE 10 MG PO TABS: 10.0000 mg | ORAL_TABLET | Freq: Every day | ORAL | Status: DC

## 2013-09-17 NOTE — Telephone Encounter (Signed)
Informed patient of medication refill and she scheduled appointment for 10/26/13

## 2013-09-17 NOTE — Telephone Encounter (Signed)
Rx sent to pharmacy. Pt is due for f/u appt. Please call to schedule. Thank you LDM

## 2013-09-25 ENCOUNTER — Ambulatory Visit: Payer: BC Managed Care – PPO | Admitting: Family Medicine

## 2013-10-02 ENCOUNTER — Ambulatory Visit (INDEPENDENT_AMBULATORY_CARE_PROVIDER_SITE_OTHER): Payer: BC Managed Care – PPO | Admitting: Family Medicine

## 2013-10-02 ENCOUNTER — Encounter: Payer: Self-pay | Admitting: Family Medicine

## 2013-10-02 VITALS — BP 138/90 | HR 76 | Temp 97.6°F | Ht 65.5 in | Wt 176.2 lb

## 2013-10-02 DIAGNOSIS — M25569 Pain in unspecified knee: Secondary | ICD-10-CM

## 2013-10-02 DIAGNOSIS — G43909 Migraine, unspecified, not intractable, without status migrainosus: Secondary | ICD-10-CM

## 2013-10-02 DIAGNOSIS — I10 Essential (primary) hypertension: Secondary | ICD-10-CM

## 2013-10-02 DIAGNOSIS — IMO0002 Reserved for concepts with insufficient information to code with codable children: Secondary | ICD-10-CM

## 2013-10-02 DIAGNOSIS — K219 Gastro-esophageal reflux disease without esophagitis: Secondary | ICD-10-CM

## 2013-10-02 DIAGNOSIS — F419 Anxiety disorder, unspecified: Principal | ICD-10-CM

## 2013-10-02 DIAGNOSIS — R000 Tachycardia, unspecified: Secondary | ICD-10-CM

## 2013-10-02 DIAGNOSIS — M25561 Pain in right knee: Secondary | ICD-10-CM

## 2013-10-02 DIAGNOSIS — J40 Bronchitis, not specified as acute or chronic: Secondary | ICD-10-CM

## 2013-10-02 DIAGNOSIS — Z90710 Acquired absence of both cervix and uterus: Secondary | ICD-10-CM

## 2013-10-02 DIAGNOSIS — M329 Systemic lupus erythematosus, unspecified: Secondary | ICD-10-CM

## 2013-10-02 DIAGNOSIS — F341 Dysthymic disorder: Secondary | ICD-10-CM

## 2013-10-02 DIAGNOSIS — F329 Major depressive disorder, single episode, unspecified: Secondary | ICD-10-CM

## 2013-10-02 DIAGNOSIS — M25562 Pain in left knee: Secondary | ICD-10-CM

## 2013-10-02 MED ORDER — ALBUTEROL SULFATE HFA 108 (90 BASE) MCG/ACT IN AERS: 2.0000 | INHALATION_SPRAY | RESPIRATORY_TRACT | Status: DC | PRN

## 2013-10-02 MED ORDER — OXYCODONE HCL 5 MG PO TABS: 5.0000 mg | ORAL_TABLET | Freq: Four times a day (QID) | ORAL | Status: DC | PRN

## 2013-10-02 MED ORDER — ESOMEPRAZOLE MAGNESIUM 40 MG PO CPDR
40.0000 mg | DELAYED_RELEASE_CAPSULE | Freq: Every day | ORAL | Status: DC
Start: 2013-10-02 — End: 2013-12-31

## 2013-10-02 MED ORDER — LISINOPRIL 10 MG PO TABS: 10.0000 mg | ORAL_TABLET | Freq: Two times a day (BID) | ORAL | Status: DC

## 2013-10-02 MED ORDER — DICLOFENAC SODIUM 1 % TD GEL: 4.0000 g | Freq: Four times a day (QID) | TRANSDERMAL | Status: DC | PRN

## 2013-10-02 MED ORDER — LORAZEPAM 0.5 MG PO TABS: 0.5000 mg | ORAL_TABLET | Freq: Two times a day (BID) | ORAL | Status: DC | PRN

## 2013-10-02 MED ORDER — SUMATRIPTAN SUCCINATE 50 MG PO TABS: 50.0000 mg | ORAL_TABLET | ORAL | Status: DC | PRN

## 2013-10-02 MED ORDER — VENLAFAXINE HCL ER 75 MG PO CP24: 150.0000 mg | ORAL_CAPSULE | Freq: Every day | ORAL | Status: DC

## 2013-10-02 MED ORDER — ESTRADIOL 0.75 MG/1.25 GM (0.06%) TD GEL: 1.2500 g | Freq: Every day | TRANSDERMAL | Status: DC

## 2013-10-02 MED ORDER — METOPROLOL SUCCINATE ER 50 MG PO TB24: 150.0000 mg | ORAL_TABLET | Freq: Every day | ORAL | Status: DC

## 2013-10-02 MED ORDER — OLANZAPINE 10 MG PO TABS
10.0000 mg | ORAL_TABLET | Freq: Every day | ORAL | Status: DC
Start: 2013-10-02 — End: 2013-12-31

## 2013-10-02 NOTE — Progress Notes (Signed)
Pre visit review using our clinic review tool, if applicable. No additional management support is needed unless otherwise documented below in the visit note. 

## 2013-10-05 ENCOUNTER — Other Ambulatory Visit: Payer: Self-pay | Admitting: Family Medicine

## 2013-10-06 ENCOUNTER — Encounter: Payer: Self-pay | Admitting: Family Medicine

## 2013-10-06 DIAGNOSIS — K219 Gastro-esophageal reflux disease without esophagitis: Secondary | ICD-10-CM | POA: Insufficient documentation

## 2013-10-06 NOTE — Assessment & Plan Note (Signed)
Avoid offending foods, start probiotics. Do not eat large meals in late evening and consider raising head of bed.  

## 2013-10-06 NOTE — Progress Notes (Signed)
Patient ID: Rebecca Boyle, female   DOB: [birth year], 43 y.o.   MRN: [id number] Rebecca Boyle [id number] [birth year] 10/06/2013      Progress Note-Follow Up  Subjective  Chief Complaint  Chief Complaint  Patient presents with  . Follow-up    HPI  Patient is a 43 year old female in today for routine medical care. Doing well. Is continuing to struggle with chronic pain both myalgias and arthritis. Follows closely with rheumatology for her lupus but has numerous other pain complaints as well. Has had recent respiratory symptoms but is improving. Has a mild nonproductive cough. Depression anxiety or adequately controlled on a combination of venlafaxine and Zyprexa. Denies CP/palp/SOB/HA/congestion/fevers/GI or GU c/o. Taking meds as prescribed  Past Medical History  Diagnosis Date  . Arthritis     hands, hips, knees, back feet  . Depression   . Lupus 2004  . PONV (postoperative nausea and vomiting)   . Syncope, cardiogenic 2002    treated with toprol  . Anxiety   . Heart murmur   . Chicken pox as a child  . Hypertension   . Tachycardia 06/29/2011  . Anxiety and depression 01/04/2011  . Headache disorder 05/16/2011  . Pain, dental 07/19/2011  . Pedal edema 08/14/2011  . Sinusitis acute 08/29/2011  . Knee pain, right 11/12/2011  . UTI (lower urinary tract infection) 11/27/2011  . Preventative health care 07/07/2012  . Poor dentition 06/22/2013    Past Surgical History  Procedure Laterality Date  . Cholecystectomy  2005  . Breast surgery  1995    lumpectomy of left breast- benign  . Right wrist nerve repair - 2008  2008    fell on nail, repair  . Abdominal hysterectomy  2010    had uterus left ovary removed 2010, right ovary removed 8/12    Family History  Problem Relation Age of Onset  . Hyperlipidemia Mother   . Hypertension Mother   . Stroke Mother     X 5 mini  . Cancer Mother 10    cervical  . Heart disease Mother   . Hypertension Father   . Heart disease  Father     cad with stent  . Arthritis Father     2 blown discs  . Stroke Maternal Grandmother   . Lupus Maternal Grandmother   . Heart disease Paternal Grandfather     History   Social History  . Marital Status: Legally Separated    Spouse Name: N/A    Number of Children: N/A  . Years of Education: college   Occupational History  . Not on file.   Social History Main Topics  . Smoking status: Current Some Day Smoker -- 0.25 packs/day for 10 years    Types: Cigarettes  . Smokeless tobacco: Never Used  . Alcohol Use: No  . Drug Use: No  . Sexual Activity: No   Other Topics Concern  . 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    Smokes 2 cigarettes a day   Denies ETOH    Current Outpatient Prescriptions on File Prior to Visit  Medication Sig Dispense Refill  . hydroxychloroquine (PLAQUENIL) 200 MG tablet Take 100 mg by mouth daily.      . Multiple Vitamin (MULITIVITAMIN WITH MINERALS) TABS Take 1 tablet by mouth  daily.      . saccharomyces boulardii (FLORASTOR) 250 MG capsule Take 1 capsule (250 mg total) by mouth daily.  30 capsule  5   No current facility-administered medications on file prior to visit.    Allergies  Allergen Reactions  . Morphine And Related Shortness Of Breath and Itching  . Tylenol [Acetaminophen] Rash    Pt states she has liver damage, and is also taking plaquenil  . Codeine Itching and Nausea And Vomiting    Review of Systems  Review of Systems  Constitutional: Positive for malaise/fatigue. Negative for fever.  HENT: Negative for congestion.   Eyes: Negative for discharge.  Respiratory: Positive for cough. Negative for shortness of breath.   Cardiovascular: Negative for chest pain, palpitations and leg swelling.  Gastrointestinal: Negative for nausea, abdominal pain and diarrhea.   Genitourinary: Negative for dysuria.  Musculoskeletal: Positive for back pain and myalgias. Negative for falls.  Skin: Negative for rash.  Neurological: Negative for loss of consciousness and headaches.  Endo/Heme/Allergies: Negative for polydipsia.  Psychiatric/Behavioral: Positive for depression. Negative for suicidal ideas. The patient is not nervous/anxious and does not have insomnia.     Objective  BP 138/90  Pulse 76  Temp(Src) 97.6 F (36.4 C) (Oral)  Ht 5' 5.5" (1.664 m)  Wt 176 lb 3.2 oz (79.924 kg)  BMI 28.86 kg/m2  SpO2 97%  Physical Exam  Physical Exam  Constitutional: She is oriented to person, place, and time and well-developed, well-nourished, and in no distress. No distress.  HENT:  Head: Normocephalic and atraumatic.  Eyes: Conjunctivae are normal.  Neck: Neck supple. No thyromegaly present.  Cardiovascular: Normal rate, regular rhythm and normal heart sounds.   No murmur heard. Pulmonary/Chest: Effort normal and breath sounds normal. She has no wheezes.  Abdominal: She exhibits no distension and no mass.  Musculoskeletal: She exhibits no edema.  Lymphadenopathy:    She has no cervical adenopathy.  Neurological: She is alert and oriented to person, place, and time.  Skin: Skin is warm and dry. No rash noted. She is not diaphoretic.  Psychiatric: Memory, affect and judgment normal.    Lab Results  Component Value Date   TSH 0.44 06/29/2011   Lab Results  Component Value Date   WBC 8.2 01/02/2013   HGB 12.7 01/02/2013   HCT 36.4 01/02/2013   MCV 91.2 01/02/2013   PLT 248 01/02/2013   Lab Results  Component Value Date   CREATININE 0.87 01/02/2013   BUN 17 01/02/2013   NA 140 01/02/2013   K 3.5 01/02/2013   CL 103 01/02/2013   CO2 25 01/02/2013   Lab Results  Component Value Date   ALT 56* 06/29/2011   AST 35 06/29/2011   ALKPHOS 82 06/29/2011   BILITOT 0.3 06/29/2011   Lab Results  Component Value Date   CHOL 205* 06/29/2011   Lab Results   Component Value Date   HDL 47.30 06/29/2011   No results found for this basename: LDLCALC   Lab Results  Component Value Date   TRIG 111.0 06/29/2011   Lab Results  Component Value Date   CHOLHDL 4 06/29/2011     Assessment & Plan  Anxiety and depression Given refill on Venlafaxine, is about to loose insurance and is switching to a provider that accepts Medicaid, this will be her last visit today  Tachycardia RRR today  HTN (hypertension) Well controlled, no changes to meds. Encouraged heart healthy diet such as the DASH diet and exercise as tolerated.  SLE (systemic lupus erythematosus) Tolerating symptoms following with Dr Jon Billings. Given last rx for pain meds today  Gastroesophageal reflux disease without esophagitis Avoid offending foods, start probiotics. Do not eat large meals in late evening and consider raising head of bed.

## 2013-10-06 NOTE — Assessment & Plan Note (Signed)
RRR today 

## 2013-10-06 NOTE — Assessment & Plan Note (Signed)
Tolerating symptoms following with Dr Jon Billings. Given last rx for pain meds today

## 2013-10-06 NOTE — Assessment & Plan Note (Signed)
Well controlled, no changes to meds. Encouraged heart healthy diet such as the DASH diet and exercise as tolerated.  °

## 2013-10-06 NOTE — Assessment & Plan Note (Signed)
Given refill on Venlafaxine, is about to loose insurance and is switching to a provider that accepts Medicaid, this will be her last visit today

## 2013-10-11 ENCOUNTER — Emergency Department (HOSPITAL_BASED_OUTPATIENT_CLINIC_OR_DEPARTMENT_OTHER)
Admission: EM | Admit: 2013-10-11 | Discharge: 2013-10-12 | Disposition: A | Discharge status: Home / Self Care | Payer: BC Managed Care – PPO | Attending: Emergency Medicine | Admitting: Emergency Medicine

## 2013-10-11 ENCOUNTER — Encounter (HOSPITAL_BASED_OUTPATIENT_CLINIC_OR_DEPARTMENT_OTHER): Payer: Self-pay | Admitting: Emergency Medicine

## 2013-10-11 ENCOUNTER — Emergency Department (HOSPITAL_BASED_OUTPATIENT_CLINIC_OR_DEPARTMENT_OTHER): Payer: BC Managed Care – PPO

## 2013-10-11 DIAGNOSIS — Z8709 Personal history of other diseases of the respiratory system: Secondary | ICD-10-CM | POA: Diagnosis not present

## 2013-10-11 DIAGNOSIS — F3289 Other specified depressive episodes: Secondary | ICD-10-CM | POA: Diagnosis not present

## 2013-10-11 DIAGNOSIS — Z87440 Personal history of urinary (tract) infections: Secondary | ICD-10-CM | POA: Diagnosis not present

## 2013-10-11 DIAGNOSIS — M1710 Unilateral primary osteoarthritis, unspecified knee: Secondary | ICD-10-CM | POA: Insufficient documentation

## 2013-10-11 DIAGNOSIS — Z79899 Other long term (current) drug therapy: Secondary | ICD-10-CM | POA: Diagnosis not present

## 2013-10-11 DIAGNOSIS — Z90710 Acquired absence of both cervix and uterus: Secondary | ICD-10-CM | POA: Insufficient documentation

## 2013-10-11 DIAGNOSIS — Z9089 Acquired absence of other organs: Secondary | ICD-10-CM | POA: Insufficient documentation

## 2013-10-11 DIAGNOSIS — IMO0002 Reserved for concepts with insufficient information to code with codable children: Secondary | ICD-10-CM

## 2013-10-11 DIAGNOSIS — Z8619 Personal history of other infectious and parasitic diseases: Secondary | ICD-10-CM | POA: Insufficient documentation

## 2013-10-11 DIAGNOSIS — I10 Essential (primary) hypertension: Secondary | ICD-10-CM | POA: Insufficient documentation

## 2013-10-11 DIAGNOSIS — M19049 Primary osteoarthritis, unspecified hand: Secondary | ICD-10-CM | POA: Insufficient documentation

## 2013-10-11 DIAGNOSIS — R112 Nausea with vomiting, unspecified: Secondary | ICD-10-CM | POA: Diagnosis not present

## 2013-10-11 DIAGNOSIS — Z8719 Personal history of other diseases of the digestive system: Secondary | ICD-10-CM | POA: Insufficient documentation

## 2013-10-11 DIAGNOSIS — F329 Major depressive disorder, single episode, unspecified: Secondary | ICD-10-CM | POA: Diagnosis not present

## 2013-10-11 DIAGNOSIS — M1610 Unilateral primary osteoarthritis, unspecified hip: Secondary | ICD-10-CM | POA: Insufficient documentation

## 2013-10-11 DIAGNOSIS — R011 Cardiac murmur, unspecified: Secondary | ICD-10-CM | POA: Diagnosis not present

## 2013-10-11 DIAGNOSIS — F411 Generalized anxiety disorder: Secondary | ICD-10-CM | POA: Diagnosis not present

## 2013-10-11 DIAGNOSIS — R109 Unspecified abdominal pain: Secondary | ICD-10-CM | POA: Insufficient documentation

## 2013-10-11 DIAGNOSIS — F17200 Nicotine dependence, unspecified, uncomplicated: Secondary | ICD-10-CM | POA: Insufficient documentation

## 2013-10-11 NOTE — ED Notes (Signed)
Pt reports RLQ pain suddenly in the past few hours. Worse with movement. Nausea/vomiting. No fevers.

## 2013-10-11 NOTE — ED Provider Notes (Signed)
CSN: [id number]     Arrival date & time 10/11/13  2115 History  This chart was scribed for Jie Stickels Smitty Cords, MD by Modena Jansky, ED Scribe. This patient was seen in room MH07/MH07 and the patient's care was started at 12:00 AM.  Chief Complaint  Patient presents with  . Abdominal Pain   Patient is a 43 y.o. female presenting with abdominal pain. The history is provided by the patient. No language interpreter was used.  Abdominal Pain Pain location:  RLQ Pain quality: sharp   Pain radiates to:  Does not radiate Pain severity:  Moderate Onset quality:  Sudden Duration:  4 hours Timing:  Constant Progression:  Worsening Chronicity:  New Context: not trauma   Relieved by:  None tried Worsened by:  Movement Ineffective treatments:  None tried Associated symptoms: nausea and vomiting   Associated symptoms: no diarrhea, no dysuria, no fever and no hematuria   Risk factors: no recent hospitalization    HPI Comments: Rebecca Boyle is a 43 y.o. female who presents to the Emergency Department complaining of constant moderate abdominal pain that started about a few hours ago. She reports that 30 minutes the non radiating pain worsened suddenly. She states that the pain is exacerbated by movement. She reports associated nausea and emesis. She reports that she has an upcoming appendectomy. She states that she has a hx of hysterectomy. She denies any inability to pass gas, fever, diarrhea, dysuria, hematuria, or trouble urinating.     Past Medical History  Diagnosis Date  . Arthritis     hands, hips, knees, back feet  . Depression   . Lupus 2004  . PONV (postoperative nausea and vomiting)   . Syncope, cardiogenic 2002    treated with toprol  . Anxiety   . Heart murmur   . Chicken pox as a child  . Hypertension   . Tachycardia 06/29/2011  . Anxiety and depression 01/04/2011  . Headache disorder 05/16/2011  . Pain, dental 07/19/2011  . Pedal edema 08/14/2011  . Sinusitis acute  08/29/2011  . Knee pain, right 11/12/2011  . UTI (lower urinary tract infection) 11/27/2011  . Preventative health care 07/07/2012  . Poor dentition 06/22/2013   Past Surgical History  Procedure Laterality Date  . Cholecystectomy  2005  . Breast surgery  1995    lumpectomy of left breast- benign  . Right wrist nerve repair - 2008  2008    fell on nail, repair  . Abdominal hysterectomy  2010    had uterus left ovary removed 2010, right ovary removed 8/12   Family History  Problem Relation Age of Onset  . Hyperlipidemia Mother   . Hypertension Mother   . Stroke Mother     X 5 mini  . Cancer Mother 30    cervical  . Heart disease Mother   . Hypertension Father   . Heart disease Father     cad with stent  . Arthritis Father     2 blown discs  . Stroke Maternal Grandmother   . Lupus Maternal Grandmother   . Heart disease Paternal Grandfather    History  Substance Use Topics  . Smoking status: Current Some Day Smoker -- 0.25 packs/day for 10 years    Types: Cigarettes  . Smokeless tobacco: Never Used  . Alcohol Use: No   OB History   Grav Para Term Preterm Abortions TAB SAB Ect Mult Living  Review of Systems  Constitutional: Negative for fever.  Gastrointestinal: Positive for nausea, vomiting and abdominal pain. Negative for diarrhea.  Genitourinary: Negative for dysuria, hematuria and difficulty urinating.  All other systems reviewed and are negative.  Allergies  Morphine and related; Tylenol; and Codeine  Home Medications   Prior to Admission medications   Medication Sig Start Date End Date Taking? Authorizing Provider  albuterol (PROVENTIL HFA;VENTOLIN HFA) 108 (90 BASE) MCG/ACT inhaler Inhale 2 puffs into the lungs every 4 (four) hours as needed for wheezing. For exercise induced asthma 10/02/13   Bradd Canary, MD  diclofenac sodium (VOLTAREN) 1 % GEL Apply 4 g topically 4 (four) times daily as needed. 10/02/13   Bradd Canary, MD  esomeprazole  (NEXIUM) 40 MG capsule Take 1 capsule (40 mg total) by mouth daily before breakfast. 10/02/13   Bradd Canary, MD  Estradiol 0.75 MG/1.25 GM (0.06%) topical gel Place 1.25 g onto the skin daily. 10/02/13   Bradd Canary, MD  hydroxychloroquine (PLAQUENIL) 200 MG tablet Take 100 mg by mouth daily.    Historical Provider, MD  lisinopril (PRINIVIL,ZESTRIL) 10 MG tablet Take 1 tablet (10 mg total) by mouth 2 (two) times daily. 10/02/13   Bradd Canary, MD  LORazepam (ATIVAN) 0.5 MG tablet Take 1 tablet (0.5 mg total) by mouth 2 (two) times daily as needed for anxiety. 10/02/13   Bradd Canary, MD  metoprolol succinate (TOPROL-XL) 50 MG 24 hr tablet Take 3 tablets (150 mg total) by mouth daily. Take with or immediately following a meal. 10/02/13   Bradd Canary, MD  Multiple Vitamin (MULITIVITAMIN WITH MINERALS) TABS Take 1 tablet by mouth daily.    Historical Provider, MD  OLANZapine (ZYPREXA) 10 MG tablet Take 1 tablet (10 mg total) by mouth daily. 10/02/13   Bradd Canary, MD  oxyCODONE (ROXICODONE) 5 MG immediate release tablet Take 1 tablet (5 mg total) by mouth every 6 (six) hours as needed for severe pain. 10/02/13   Bradd Canary, MD  saccharomyces boulardii (FLORASTOR) 250 MG capsule Take 1 capsule (250 mg total) by mouth daily. 05/29/13   Bradd Canary, MD  SUMAtriptan (IMITREX) 50 MG tablet Take 1 tablet (50 mg total) by mouth every 2 (two) hours as needed for migraine or headache. May repeat in 2 hours if headache persists or recurs. 10/02/13   Bradd Canary, MD  venlafaxine XR (EFFEXOR XR) 75 MG 24 hr capsule Take 2 capsules (150 mg total) by mouth daily. 10/02/13   Bradd Canary, MD   There were no vitals taken for this visit. Physical Exam  Nursing note and vitals reviewed. Constitutional: She is oriented to person, place, and time. She appears well-developed and well-nourished. No distress.  HENT:  Head: Normocephalic and atraumatic.  Mouth/Throat: Oropharynx is clear and moist.  Eyes: Pupils  are equal, round, and reactive to light.  Neck: Normal range of motion. Neck supple. No tracheal deviation present.  Cardiovascular: Normal rate, regular rhythm and normal heart sounds.  Exam reveals no gallop and no friction rub.   No murmur heard. Pulmonary/Chest: Effort normal and breath sounds normal. No respiratory distress. She has no wheezes. She has no rales.  Abdominal: Soft. She exhibits no distension. Bowel sounds are increased. There is no rebound, no guarding and negative Murphy's sign.  Hyperactive bowel sounds.   Musculoskeletal: Normal range of motion.  Neurological: She is alert and oriented to person, place, and time. She has normal reflexes.  Skin: Skin is warm and dry.  Psychiatric: She has a normal mood and affect. Her behavior is normal.    ED Course  Procedures (including critical care time) DIAGNOSTIC STUDIES:   COORDINATION OF CARE: 12:04 AM- Pt advised of plan for treatment which includes medication, radiology, and labs and pt agrees.  Labs Review Labs Reviewed  CBC WITH DIFFERENTIAL  URINALYSIS, ROUTINE W REFLEX MICROSCOPIC  BASIC METABOLIC PANEL    Imaging Review No results found.   EKG Interpretation None      MDM   Final diagnoses:  None    Appendix is normal there is a lot of gas on exam and CT.  Will treat for same.  Follow up with your family doctor for ongoing care.    I personally performed the services described in this documentation, which was scribed in my presence. The recorded information has been reviewed and is accurate.      Jasmine Awe, MD 10/12/13 [phone]

## 2013-10-12 ENCOUNTER — Encounter (HOSPITAL_BASED_OUTPATIENT_CLINIC_OR_DEPARTMENT_OTHER): Payer: Self-pay | Admitting: Emergency Medicine

## 2013-10-12 LAB — BASIC METABOLIC PANEL
ANION GAP: 16 — AB (ref 5–15)
BUN: 13 mg/dL (ref 6–23)
CHLORIDE: 103 meq/L (ref 96–112)
CO2: 23 meq/L (ref 19–32)
Calcium: 9.9 mg/dL (ref 8.4–10.5)
Creatinine, Ser: 0.8 mg/dL (ref 0.50–1.10)
GFR calc Af Amer: >90 mL/min (ref >90)
GFR calc non Af Amer: 88 mL/min — ABNORMAL LOW (ref >90)
GLUCOSE: 91 mg/dL (ref 70–99)
Potassium: 3.8 mEq/L (ref 3.7–5.3)
SODIUM: 142 meq/L (ref 137–147)

## 2013-10-12 LAB — URINALYSIS, ROUTINE W REFLEX MICROSCOPIC
Bilirubin Urine: NEGATIVE
GLUCOSE, UA: NEGATIVE mg/dL
HGB URINE DIPSTICK: NEGATIVE
Ketones, ur: NEGATIVE mg/dL
LEUKOCYTES UA: NEGATIVE
Nitrite: NEGATIVE
PH: 6 (ref 5.0–8.0)
Protein, ur: NEGATIVE mg/dL
Specific Gravity, Urine: 1.027 (ref 1.005–1.030)
Urobilinogen, UA: 0.2 mg/dL (ref 0.0–1.0)

## 2013-10-12 LAB — CBC WITH DIFFERENTIAL/PLATELET
Basophils Absolute: 0 10*3/uL (ref 0.0–0.1)
Basophils Relative: 0 % (ref 0–1)
Eosinophils Absolute: 0.1 10*3/uL (ref 0.0–0.7)
Eosinophils Relative: 1 % (ref 0–5)
HCT: 40.7 % (ref 36.0–46.0)
HEMOGLOBIN: 13.6 g/dL (ref 12.0–15.0)
LYMPHS ABS: 3.9 10*3/uL (ref 0.7–4.0)
LYMPHS PCT: 43 % (ref 12–46)
MCH: 31.2 pg (ref 26.0–34.0)
MCHC: 33.4 g/dL (ref 30.0–36.0)
MCV: 93.3 fL (ref 78.0–100.0)
MONOS PCT: 9 % (ref 3–12)
Monocytes Absolute: 0.9 10*3/uL (ref 0.1–1.0)
Neutro Abs: 4.2 10*3/uL (ref 1.7–7.7)
Neutrophils Relative %: 47 % (ref 43–77)
PLATELETS: 192 10*3/uL (ref 150–400)
RBC: 4.36 MIL/uL (ref 3.87–5.11)
RDW: 13.7 % (ref 11.5–15.5)
WBC: 9.1 10*3/uL (ref 4.0–10.5)

## 2013-10-12 MED ORDER — DICYCLOMINE HCL 20 MG PO TABS: 20.0000 mg | ORAL_TABLET | Freq: Two times a day (BID) | ORAL | Status: DC

## 2013-10-12 MED ORDER — IOHEXOL 300 MG/ML  SOLN
50.0000 mL | Freq: Once | INTRAMUSCULAR | Status: AC | PRN
  Administered 2013-10-11: 50 mL via ORAL

## 2013-10-12 MED ORDER — FENTANYL CITRATE 0.05 MG/ML IJ SOLN
100.0000 ug | Freq: Once | INTRAMUSCULAR | Status: AC
  Administered 2013-10-12: 100 ug via INTRAVENOUS
  Filled 2013-10-12: qty 2

## 2013-10-12 MED ORDER — SODIUM CHLORIDE 0.9 % IV BOLUS (SEPSIS)
500.0000 mL | Freq: Once | INTRAVENOUS | Status: AC
  Administered 2013-10-12: 500 mL via INTRAVENOUS

## 2013-10-12 MED ORDER — ONDANSETRON HCL 4 MG/2ML IJ SOLN
4.0000 mg | Freq: Once | INTRAMUSCULAR | Status: AC
  Administered 2013-10-12: 4 mg via INTRAVENOUS
  Filled 2013-10-12: qty 2

## 2013-10-12 MED ORDER — IOHEXOL 300 MG/ML  SOLN
100.0000 mL | Freq: Once | INTRAMUSCULAR | Status: AC | PRN
  Administered 2013-10-12: 100 mL via INTRAVENOUS

## 2013-10-13 ENCOUNTER — Other Ambulatory Visit: Payer: Self-pay | Admitting: Family Medicine

## 2013-10-13 DIAGNOSIS — F32A Depression, unspecified: Secondary | ICD-10-CM

## 2013-10-13 DIAGNOSIS — F329 Major depressive disorder, single episode, unspecified: Secondary | ICD-10-CM

## 2013-10-13 DIAGNOSIS — F419 Anxiety disorder, unspecified: Principal | ICD-10-CM

## 2013-10-14 MED ORDER — VENLAFAXINE HCL ER 75 MG PO CP24: 150.0000 mg | ORAL_CAPSULE | Freq: Every day | ORAL | Status: DC

## 2013-10-14 NOTE — Telephone Encounter (Signed)
Was done on 10-02-13

## 2013-10-16 ENCOUNTER — Telehealth: Payer: Self-pay | Admitting: *Deleted

## 2013-10-16 NOTE — Telephone Encounter (Signed)
Received prior authorization request form for Sumatriptan Succ  via fax from [facility].  Authorization was done by phone on (10/15/13).  Awaiting aprroval.//AB/CMA

## 2013-10-16 NOTE — Telephone Encounter (Signed)
We resent PA with quantity dropped to 6 tabs monthly  Waiting on response

## 2013-10-16 NOTE — Telephone Encounter (Signed)
FYI:  Imitrex was denied

## 2013-10-24 ENCOUNTER — Ambulatory Visit: Payer: BC Managed Care – PPO | Admitting: Family Medicine

## 2013-11-03 ENCOUNTER — Other Ambulatory Visit: Payer: Self-pay | Admitting: Family Medicine

## 2013-11-06 NOTE — Telephone Encounter (Signed)
Called and spoke with the pharmacy [facility]) and was informed the Sumatriptan Succ 50mg  was approved.//AB/CMA

## 2013-12-08 ENCOUNTER — Other Ambulatory Visit: Payer: Self-pay | Admitting: Family Medicine

## 2013-12-30 ENCOUNTER — Ambulatory Visit: Payer: BC Managed Care – PPO | Admitting: Nurse Practitioner

## 2013-12-31 ENCOUNTER — Encounter: Payer: Self-pay | Admitting: Nurse Practitioner

## 2013-12-31 ENCOUNTER — Ambulatory Visit (INDEPENDENT_AMBULATORY_CARE_PROVIDER_SITE_OTHER): Payer: BC Managed Care – PPO | Admitting: Nurse Practitioner

## 2013-12-31 VITALS — BP 133/89 | HR 91 | Temp 98.1°F | Resp 18 | Ht 65.5 in | Wt 178.0 lb

## 2013-12-31 DIAGNOSIS — Z0000 Encounter for general adult medical examination without abnormal findings: Secondary | ICD-10-CM

## 2013-12-31 DIAGNOSIS — F418 Other specified anxiety disorders: Secondary | ICD-10-CM

## 2013-12-31 DIAGNOSIS — F329 Major depressive disorder, single episode, unspecified: Secondary | ICD-10-CM

## 2013-12-31 DIAGNOSIS — M329 Systemic lupus erythematosus, unspecified: Secondary | ICD-10-CM

## 2013-12-31 DIAGNOSIS — Z23 Encounter for immunization: Secondary | ICD-10-CM

## 2013-12-31 DIAGNOSIS — K219 Gastro-esophageal reflux disease without esophagitis: Secondary | ICD-10-CM

## 2013-12-31 DIAGNOSIS — I10 Essential (primary) hypertension: Secondary | ICD-10-CM

## 2013-12-31 DIAGNOSIS — F32A Depression, unspecified: Secondary | ICD-10-CM

## 2013-12-31 DIAGNOSIS — F419 Anxiety disorder, unspecified: Secondary | ICD-10-CM

## 2013-12-31 MED ORDER — OLANZAPINE 10 MG PO TABS: 10.0000 mg | ORAL_TABLET | Freq: Every day | ORAL | Status: DC

## 2013-12-31 MED ORDER — LISINOPRIL 10 MG PO TABS: 10.0000 mg | ORAL_TABLET | Freq: Two times a day (BID) | ORAL | Status: DC

## 2013-12-31 MED ORDER — LORAZEPAM 0.5 MG PO TABS: 0.5000 mg | ORAL_TABLET | Freq: Two times a day (BID) | ORAL | Status: DC | PRN

## 2013-12-31 MED ORDER — ESOMEPRAZOLE MAGNESIUM 20 MG PO CPDR: 20.0000 mg | DELAYED_RELEASE_CAPSULE | Freq: Every day | ORAL | Status: DC

## 2013-12-31 NOTE — Progress Notes (Signed)
Pre visit review using our clinic review tool, if applicable. No additional management support is needed unless otherwise documented below in the visit note. 

## 2013-12-31 NOTE — Progress Notes (Signed)
Subjective:     Rebecca Boyle is a 43 y.o. female presents to transition care from another [place] office. Today she is requesting refills for lisinopril, effexor, & zyprexa. She is new pt to me, although I saw her for acute visit (URI) 1 yr ago.  Rebecca Boyle has been treated for SLE for many years, by Rebecca Boyle. She is transitioning care to [facility]. She is on plaquenil 200 mg bid. She had dilated eye exam a few mos ago to screen for plaquenil toxicity. She reports no abnormalities. Last SLE flare was about 4 mos ago involving painful swollen joints of hands & knees, rash & fever. With past flares, additional symptoms included sores in mouth & rash after sun exposure. There is no Hx of kidney involvement. She has Hx of arrhythmia & is on toprol. She reports occasional self-limiting episodes of palpitations lasting few seconds w/no accompanying symptoms. It os possible that she has CNS involvement as she has long Hx of anxiety & depression, and chronic use of prescription narcotics-last filled script: 11/24/13 oxycodone 10 mg 20 tabs from an oral surgeon (Rebecca Boyle) according to [facility] Controlled Substance Report. She had TAH 4ya for cysts on ovaries. She is on HRT. She takes high dose nexium qd w/no c/o reflux symptoms. She uses imitrex about once/month for [facility] w/relief.  The following portions of the patient's history were reviewed and updated as appropriate: allergies, current medications, past medical history, past social history, past surgical history and problem list.  Review of Systems Constitutional: negative for fatigue and fevers Eyes: negative for visual disturbance Respiratory: negative for cough Cardiovascular: positive for irregular heart beat, negative for chest pressure/discomfort, lower extremity edema and near-syncope Gastrointestinal: negative for change in bowel habits, constipation, diarrhea and reflux symptoms Integument/breast: negative for rash and skin  lesion(s) Musculoskeletal:negative for arthralgias and myalgias Neurological: negative for headaches Behavioral/Psych: expresses difficult time after daughter gave birth to baby w/trisomy. 11 mos ago    Objective:    BP 133/89 mmHg  Pulse 91  Temp(Src) 98.1 F (36.7 C) (Oral)  Resp 18  Ht 5' 5.5" (1.664 m)  Wt 178 lb (80.74 kg)  BMI 29.16 kg/m2  SpO2 97% BP 133/89 mmHg  Pulse 91  Temp(Src) 98.1 F (36.7 C) (Oral)  Resp 18  Ht 5' 5.5" (1.664 m)  Wt 178 lb (80.74 kg)  BMI 29.16 kg/m2  SpO2 97% General appearance: alert, cooperative, appears stated age and no distress Head: Normocephalic, without obvious abnormality, atraumatic Eyes: negative findings: lids and lashes normal and conjunctivae and sclerae normal Neck: no adenopathy, no carotid bruit, supple, symmetrical, trachea midline and thyroid not enlarged, symmetric, no tenderness/mass/nodules Lungs: clear to auscultation bilaterally Heart: regular rate and rhythm, S1, S2 normal, no murmur, click, rub or gallop Extremities: extremities normal, atraumatic, no cyanosis or edema and no swollen joints in hands or wrists Pulses: 2+ and symmetric Skin: Skin color, texture, turgor normal. No rashes or lesions Lymph nodes: no cervical or [place] LAD    Assessment:Plan   1. Depression Will refill scripts until she can see [facility], appt may be Feb. - Ambulatory referral to Psychiatry  2. Anxiety and depression - LORazepam (ATIVAN) 0.5 MG tablet; Take 1 tablet (0.5 mg total) by mouth 2 (two) times daily as needed for anxiety.  Dispense: 60 tablet; Refill: 1 - OLANZapine (ZYPREXA) 10 MG tablet; Take 1 tablet (10 mg total) by mouth daily.  Dispense: 90 tablet; Refill: 0  3. Essential hypertension Well controlled Continue  daily exercise - lisinopril (PRINIVIL,ZESTRIL) 10 MG tablet; Take 1 tablet (10 mg total) by mouth 2 (two) times daily.  Dispense: 180 tablet; Refill: 1 - Comprehensive metabolic panel; Future  4. Gastroesophageal reflux  disease without esophagitis Decrease dose. Check mag - esomeprazole (NEXIUM) 20 MG capsule; Take 1 capsule (20 mg total) by mouth daily before breakfast.  Dispense: 90 capsule; Refill: 1  5. Needs flu shot - Flu Vaccine QUAD 36+ mos PF IM (Fluarix Quad PF)  6. Systemic lupus erythematosus Last flare 4 mos ago Continue PLQ Transitioning care from [facility] to [facility] - CBC with Differential; Future - Comprehensive metabolic panel; Future - Microalbumin / creatinine urine ratio; Future - Lipid panel; Future - Sedimentation rate; Future - ANA w/Reflex if Positive; Future  7. Preventative health care - Vit D  25 hydroxy (rtn osteoporosis monitoring); Future - Vitamin B12; Future - Hemoglobin A1c; Future - T4, free; Future - TSH; Future Needs MMG Pt to ret for fasting labs.

## 2013-12-31 NOTE — Patient Instructions (Signed)
I have referred you to behavioral health in [place]. They will be able to see you in February. If your insurance changes before then, You will need to see the PCP assigned to you & get new referral if needed.  Continue with daily exercise.  Please return for fasting labs.  Make appointment with [facility].  Nice to see you!

## 2014-02-13 ENCOUNTER — Ambulatory Visit (INDEPENDENT_AMBULATORY_CARE_PROVIDER_SITE_OTHER): Payer: BLUE CROSS/BLUE SHIELD | Admitting: Psychiatry

## 2014-02-13 ENCOUNTER — Encounter (HOSPITAL_COMMUNITY): Payer: Self-pay | Admitting: Psychiatry

## 2014-02-13 VITALS — BP 138/89 | HR 88 | Ht 66.0 in | Wt 158.0 lb

## 2014-02-13 DIAGNOSIS — F331 Major depressive disorder, recurrent, moderate: Secondary | ICD-10-CM

## 2014-02-13 DIAGNOSIS — F329 Major depressive disorder, single episode, unspecified: Secondary | ICD-10-CM

## 2014-02-13 DIAGNOSIS — F410 Panic disorder [episodic paroxysmal anxiety] without agoraphobia: Secondary | ICD-10-CM

## 2014-02-13 DIAGNOSIS — F419 Anxiety disorder, unspecified: Secondary | ICD-10-CM

## 2014-02-13 MED ORDER — VENLAFAXINE HCL ER 37.5 MG PO CP24: 37.5000 mg | ORAL_CAPSULE | Freq: Every day | ORAL | Status: DC

## 2014-02-13 MED ORDER — LORAZEPAM 0.5 MG PO TABS: ORAL_TABLET | ORAL | Status: DC

## 2014-02-13 MED ORDER — ESCITALOPRAM OXALATE 5 MG PO TABS: 5.0000 mg | ORAL_TABLET | Freq: Every day | ORAL | Status: DC

## 2014-02-13 NOTE — Patient Instructions (Signed)
Taper down effexor and stop in one month. Start lexapro 5mg  today. Slowly taper down ativan to one tablet a day.

## 2014-02-13 NOTE — Progress Notes (Signed)
Patient ID: Rebecca PicketStephanie H Boyle, female   DOB: 03/16/1970, 44 y.o.   MRN: [id number]  [facility]   Rebecca PicketStephanie H Boyle [id number] 44 y.o.  02/13/2014 12:31 PM  Chief Complaint:  Depression and anxiety.  History of Present Illness:   Patient Presents for Initial Evaluation with symptoms of depression . Referred by Dr. Valera Castleprimary care physician. For medication management. States that she's been doing reasonably with her current medications and regarding for depression but she has history of depression including anhedonia, decreased sleep withdrawn behavior disturbed sleep energy appetite and crying spells.  Aggravating factors include her daughter lost her baby because of trisomy. That upset the patient also at that happened in December. Other daughter had threatened suicide she was having difficult time in the community she is a homosexual and she was getting that threats from the community.  She also takes care of her mom.  Modifying factors; she is very close to her family and supportive fianc and her daughter's. She works as a Designer, jewelleryregistered nurse in the past, But not as of now.  She endorses panic-like symptoms at times and last panic attack was a few months ago including disturb the mentation and confusion and the palpitations including shortness. She says Ativan does help she understands she is not supposed to increase the dose of the medication next graph there is no psychotic symptoms currently or in the past is no manic symptoms currently or in the past  She believes the medication is working but her primary care physician wanted the psychiatrist to handle these  This substancesofabuse.  No history of physical or sexual trauma  Past medication use has been Lexapro that is helpful but that it was changed to Effexor. She believes that Effexor may be raising her blood pressure. She also understands olanzapine may not be a good choice for  depression but she likes it because it helps her mood symptoms and also for sleep and she does not want to change their respective she understands that it has some concerns and side effects. She did gain weight on olanzapine but as of now she does not want to change it.     Past Psychiatric History/Hospitalization(s)  Age [age]s she was admitted at: Because of depression. She has been on different medications since then. There is no clear indication of what is the cause of depression at that time. Most of suicide patient also has never attempted suicide  Hospitalization for psychiatric illness: Yes History of Electroconvulsive Shock Therapy: No Prior Suicide Attempts: No  Medical History; Past Medical History  Diagnosis Date  . Arthritis     hands, hips, knees, back feet  . Depression   . Lupus 2004  . PONV (postoperative nausea and vomiting)   . Syncope, cardiogenic 2002    treated with toprol  . Anxiety   . Heart murmur   . Chicken pox as a child  . Hypertension   . Tachycardia 06/29/2011  . Anxiety and depression 01/04/2011  . Headache disorder 05/16/2011  . Pain, dental 07/19/2011  . Pedal edema 08/14/2011  . Sinusitis acute 08/29/2011  . Knee pain, right 11/12/2011  . UTI (lower urinary tract infection) 11/27/2011  . Preventative health care 07/07/2012  . Poor dentition 06/22/2013    Allergies: Allergies  Allergen Reactions  . Morphine And Related Shortness Of Breath and Itching  . Tylenol [Acetaminophen] Rash    Pt states she has liver damage, and is also taking plaquenil  . Codeine  Itching and Nausea And Vomiting    Medications: Outpatient Encounter Prescriptions as of 02/13/2014  Medication Sig  . albuterol (PROVENTIL HFA;VENTOLIN HFA) 108 (90 BASE) MCG/ACT inhaler Inhale 2 puffs into the lungs every 4 (four) hours as needed for wheezing. For exercise induced asthma  . diclofenac sodium (VOLTAREN) 1 % GEL Apply 4 g topically 4 (four) times daily as needed.  .  dicyclomine (BENTYL) 20 MG tablet Take 1 tablet (20 mg total) by mouth 2 (two) times daily. (Patient not taking: Reported on 12/31/2013)  . escitalopram (LEXAPRO) 5 MG tablet Take 1 tablet (5 mg total) by mouth daily.  Marland Kitchen esomeprazole (NEXIUM) 20 MG capsule Take 1 capsule (20 mg total) by mouth daily before breakfast.  . Estradiol 0.75 MG/1.25 GM (0.06%) topical gel Place 1.25 g onto the skin daily.  . hydroxychloroquine (PLAQUENIL) 200 MG tablet Take 100 mg by mouth daily.  Marland Kitchen lisinopril (PRINIVIL,ZESTRIL) 10 MG tablet Take 1 tablet (10 mg total) by mouth 2 (two) times daily.  Marland Kitchen LORazepam (ATIVAN) 0.5 MG tablet Start January 26 or after.  . metoprolol succinate (TOPROL-XL) 50 MG 24 hr tablet Take 3 tablets (150 mg total) by mouth daily. Take with or immediately following a meal.  . Multiple Vitamin (MULITIVITAMIN WITH MINERALS) TABS Take 1 tablet by mouth daily.  Marland Kitchen OLANZapine (ZYPREXA) 10 MG tablet Take 1 tablet (10 mg total) by mouth daily.  Marland Kitchen oxyCODONE (ROXICODONE) 5 MG immediate release tablet Take 1 tablet (5 mg total) by mouth every 6 (six) hours as needed for severe pain.  Marland Kitchen saccharomyces boulardii (FLORASTOR) 250 MG capsule Take 1 capsule (250 mg total) by mouth daily. (Patient not taking: Reported on 12/31/2013)  . SUMAtriptan (IMITREX) 50 MG tablet Take 1 tablet (50 mg total) by mouth every 2 (two) hours as needed for migraine or headache. May repeat in 2 hours if headache persists or recurs.  . venlafaxine XR (EFFEXOR XR) 75 MG 24 hr capsule Take 2 capsules (150 mg total) by mouth daily.  Marland Kitchen venlafaxine XR (EFFEXOR-XR) 37.5 MG 24 hr capsule Take 1 capsule (37.5 mg total) by mouth daily with breakfast.  . [DISCONTINUED] LORazepam (ATIVAN) 0.5 MG tablet Take 1 tablet (0.5 mg total) by mouth 2 (two) times daily as needed for anxiety.     Substance Abuse History:   Family History; Family History  Problem Relation Age of Onset  . Hyperlipidemia Mother   . Hypertension Mother   . Stroke  Mother     X 5 mini  . Cancer Mother 31    cervical  . Heart disease Mother   . Hypertension Father   . Heart disease Father     cad with stent  . Arthritis Father     2 blown discs  . Stroke Maternal Grandmother   . Lupus Maternal Grandmother   . Heart disease Paternal Grandfather       Biopsychosocial History:   She grew up with her parents. There is no physical or sexual trauma. 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 She is unmarried and past but because of infidelity it ended. She has 2 daughters age 22 and 21 when she is very close with and also close with her dad as of now. Currently she is in a relationship and has a fianc.   Labs:  No results found for this or any previous visit (from the past 2160 hour(s)).     Musculoskeletal: Strength &  Muscle Tone: within normal limits Gait & Station: normal Patient leans: N/A  Mental Status Examination;   Psychiatric Specialty Exam: Physical Exam  Constitutional: She appears well-developed and well-nourished. No distress.    Review of Systems  Constitutional: Negative.   Cardiovascular: Negative for chest pain.  Gastrointestinal: Negative for nausea.  Skin: Negative for rash.  Psychiatric/Behavioral: Positive for depression. Negative for suicidal ideas and substance abuse. The patient is not nervous/anxious.     Blood pressure 138/89, pulse 88, height  (1.676 m), weight 158 lb (71.668 kg).Body mass index is 25.51 kg/(m^2).  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 Panic disorder without agoraphobia  Axis II: Deferred  Axis III:  Past Medical History  Diagnosis Date  . Arthritis     hands, hips, knees, back feet  . Depression   . Lupus 2004  . PONV (postoperative nausea and vomiting)   . Syncope, cardiogenic 2002    treated with toprol  . Anxiety   . Heart murmur   . Chicken pox as a child  . Hypertension   . Tachycardia 06/29/2011  . Anxiety and depression 01/04/2011  . Headache disorder 05/16/2011  . Pain, dental 07/19/2011  . Pedal edema 08/14/2011  . Sinusitis acute 08/29/2011  . Knee pain, right 11/12/2011  . UTI (lower urinary tract infection) 11/27/2011  . Preventative health care 07/07/2012  . Poor dentition 06/22/2013    Axis IV: Psychosocial.    Treatment Plan and Summary: She is concerned about his blood pressure with Effexor and we will slowly taper down and she will be taking 75 mg for the next 2 weeks and then 37.5 mg for the next 2 weeks and hence discontinue.  We will start on Lexapro 5 mg as of now. I cautioned again about Zyprexa as of now she will continue and then lived probably may decide about cutting down and discussing other alternative. She remains satisfied with the plan Pertinent Labs and Relevant Prior Notes reviewed. Medication Side effects, benefits and risks reviewed/discussed with Patient. Time given for patient to respond and asks questions regarding the Diagnosis and Medications. Safety concerns and to report to ER if suicidal or call 911. Relevant Medications refilled or called in to pharmacy. Discussed weight maintenance and Sleep Hygiene. Follow up with Primary care provider in regards to Medical conditions. Recommend compliance with medications and follow up office  appointments. Discussed to avail opportunity to consider or/and continue Individual therapy with Counselor. Greater than 50% of time was spend in counseling and coordination of care with the patient.  Schedule for Follow up visit in 4 weeks  or call in earlier as necessary.   Thresa Ross, MD 02/13/2014

## 2014-02-23 ENCOUNTER — Other Ambulatory Visit: Payer: Self-pay | Admitting: *Deleted

## 2014-02-23 DIAGNOSIS — I10 Essential (primary) hypertension: Secondary | ICD-10-CM

## 2014-02-23 NOTE — Telephone Encounter (Signed)
pls call pt: Ask if she has an apt w/ another provider. She said her ins. Was changing to medicaid & she may be assigned to new provider.

## 2014-02-23 NOTE — Telephone Encounter (Signed)
Pharmacy sent request for refill on metoprolol with a 90 day supply. Rx has never been filled by provider. Please advise?

## 2014-02-24 ENCOUNTER — Telehealth (HOSPITAL_COMMUNITY): Payer: Self-pay | Admitting: *Deleted

## 2014-02-24 MED ORDER — METOPROLOL SUCCINATE ER 50 MG PO TB24: 150.0000 mg | ORAL_TABLET | Freq: Every day | ORAL | Status: DC

## 2014-02-24 NOTE — Telephone Encounter (Signed)
pls schedule OV in March. I refilled metoprolol.

## 2014-02-24 NOTE — Telephone Encounter (Signed)
Junious Dresseronnie from [facility] called for instruction for Pt Ativan. Return call and spoke with Michele McalpinePhil, per Dr. Gilmore LarocheAkhtar, pt will take 1 tab PO B.I.D prn for anxiety.  Pharmacy will call pt when prescription is ready.

## 2014-02-24 NOTE — Telephone Encounter (Signed)
Patient stated that she does not have appt for new pcp. Patient stated that she is still covered by her insurance.

## 2014-03-03 NOTE — Telephone Encounter (Signed)
Appt scheduled

## 2014-03-16 ENCOUNTER — Ambulatory Visit (HOSPITAL_COMMUNITY): Payer: Self-pay | Admitting: Psychiatry

## 2014-03-25 ENCOUNTER — Telehealth: Payer: Self-pay | Admitting: *Deleted

## 2014-03-25 ENCOUNTER — Telehealth: Payer: Self-pay | Admitting: Nurse Practitioner

## 2014-03-25 ENCOUNTER — Other Ambulatory Visit: Payer: Self-pay | Admitting: Nurse Practitioner

## 2014-03-25 DIAGNOSIS — I10 Essential (primary) hypertension: Secondary | ICD-10-CM

## 2014-03-25 MED ORDER — LISINOPRIL 10 MG PO TABS: 10.0000 mg | ORAL_TABLET | Freq: Two times a day (BID) | ORAL | Status: DC

## 2014-03-25 MED ORDER — LISINOPRIL 20 MG PO TABS: 10.0000 mg | ORAL_TABLET | Freq: Two times a day (BID) | ORAL | Status: DC

## 2014-03-25 NOTE — Telephone Encounter (Signed)
[facility]

## 2014-03-25 NOTE — Telephone Encounter (Signed)
[facility] [place] [facility] that pt's lisinopril be switched to 20 mg 1 tab qd instead of10 mg 2 tab bid, due to insurance coverage. Please advise?

## 2014-03-27 ENCOUNTER — Telehealth (HOSPITAL_COMMUNITY): Payer: Self-pay

## 2014-03-27 DIAGNOSIS — F419 Anxiety disorder, unspecified: Principal | ICD-10-CM

## 2014-03-27 DIAGNOSIS — F32A Depression, unspecified: Secondary | ICD-10-CM

## 2014-03-27 DIAGNOSIS — F329 Major depressive disorder, single episode, unspecified: Secondary | ICD-10-CM

## 2014-03-27 MED ORDER — OLANZAPINE 10 MG PO TABS: 10.0000 mg | ORAL_TABLET | Freq: Every day | ORAL | Status: DC

## 2014-03-27 MED ORDER — LORAZEPAM 0.5 MG PO TABS: ORAL_TABLET | ORAL | Status: DC

## 2014-03-27 NOTE — Telephone Encounter (Signed)
PT's appt was cancelled due to the weather. She will be coming in on the 29th but she is out of medication now. Please call in LORazepam (ATIVAN) 0.5 MG tablet and OLANZapine (ZYPREXA) 10 MG tablet  [facility] [place]

## 2014-03-27 NOTE — Telephone Encounter (Signed)
[facility]apine [facility]. Lorazepam printed and will be faxed to pharmacy.

## 2014-03-30 ENCOUNTER — Encounter (INDEPENDENT_AMBULATORY_CARE_PROVIDER_SITE_OTHER): Payer: Self-pay

## 2014-03-30 ENCOUNTER — Encounter (HOSPITAL_COMMUNITY): Payer: Self-pay | Admitting: Psychiatry

## 2014-03-30 ENCOUNTER — Ambulatory Visit (INDEPENDENT_AMBULATORY_CARE_PROVIDER_SITE_OTHER): Payer: BLUE CROSS/BLUE SHIELD | Admitting: Psychiatry

## 2014-03-30 VITALS — BP 134/83 | HR 88 | Ht 66.0 in | Wt 173.0 lb

## 2014-03-30 DIAGNOSIS — F330 Major depressive disorder, recurrent, mild: Secondary | ICD-10-CM

## 2014-03-30 DIAGNOSIS — F329 Major depressive disorder, single episode, unspecified: Secondary | ICD-10-CM

## 2014-03-30 DIAGNOSIS — F331 Major depressive disorder, recurrent, moderate: Secondary | ICD-10-CM

## 2014-03-30 DIAGNOSIS — F419 Anxiety disorder, unspecified: Principal | ICD-10-CM

## 2014-03-30 DIAGNOSIS — F4310 Post-traumatic stress disorder, unspecified: Secondary | ICD-10-CM

## 2014-03-30 DIAGNOSIS — F410 Panic disorder [episodic paroxysmal anxiety] without agoraphobia: Secondary | ICD-10-CM | POA: Diagnosis not present

## 2014-03-30 MED ORDER — ESCITALOPRAM OXALATE 10 MG PO TABS: 10.0000 mg | ORAL_TABLET | Freq: Every day | ORAL | Status: DC
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
## 2014-03-31 ENCOUNTER — Ambulatory Visit: Payer: Self-pay | Admitting: Nurse Practitioner

## 2014-04-20 ENCOUNTER — Telehealth (HOSPITAL_COMMUNITY): Payer: Self-pay

## 2014-04-20 DIAGNOSIS — F329 Major depressive disorder, single episode, unspecified: Secondary | ICD-10-CM

## 2014-04-20 DIAGNOSIS — F419 Anxiety disorder, unspecified: Principal | ICD-10-CM

## 2014-04-20 NOTE — Telephone Encounter (Signed)
LORazepam (ATIVAN) 0.5 MG tablet OLANZapine (ZYPREXA) 10 MG tablet  PT needs both scripts filled.

## 2014-04-21 MED ORDER — LORAZEPAM 0.5 MG PO TABS: ORAL_TABLET | ORAL | Status: DC

## 2014-04-21 MED ORDER — OLANZAPINE 10 MG PO TABS: 10.0000 mg | ORAL_TABLET | Freq: Every day | ORAL | Status: DC

## 2014-04-21 NOTE — Telephone Encounter (Signed)
Pt called for a refill for Ativan 0.5mg  and Zyprexa 10mg . Per Dr. Gilmore LarocheAkhtar, pt is authorized for Ativan 0.5mg , Qty 60 and Zyprexa 10mg , Qty 30. [facility] Called and informed pt of prescriptions status. Pt verbally states and shows understanding. Pt has a f/u appt on 4/4.

## 2014-04-30 ENCOUNTER — Encounter (HOSPITAL_COMMUNITY): Payer: Self-pay | Admitting: Licensed Clinical Social Worker

## 2014-04-30 ENCOUNTER — Ambulatory Visit (INDEPENDENT_AMBULATORY_CARE_PROVIDER_SITE_OTHER): Payer: BLUE CROSS/BLUE SHIELD | Admitting: Licensed Clinical Social Worker

## 2014-04-30 DIAGNOSIS — F3341 Major depressive disorder, recurrent, in partial remission: Secondary | ICD-10-CM | POA: Diagnosis not present

## 2014-04-30 DIAGNOSIS — F4310 Post-traumatic stress disorder, unspecified: Secondary | ICD-10-CM
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
## 2014-05-04 ENCOUNTER — Ambulatory Visit (HOSPITAL_COMMUNITY): Payer: Self-pay | Admitting: Psychiatry

## 2014-05-13 ENCOUNTER — Other Ambulatory Visit: Payer: Self-pay

## 2014-05-13 DIAGNOSIS — I10 Essential (primary) hypertension: Secondary | ICD-10-CM

## 2014-05-13 MED ORDER — LISINOPRIL 20 MG PO TABS: 10.0000 mg | ORAL_TABLET | Freq: Two times a day (BID) | ORAL | Status: DC

## 2014-05-13 NOTE — Telephone Encounter (Signed)
LMOVM for patient to call back.

## 2014-05-13 NOTE — Telephone Encounter (Signed)
Please Advise Refill Request? Refill request for- Lisinopril 20mg  (wanting a 90 day supply) Last filled by MD on - 03/25/14 Last Appt - 12/31/13        Next Appt - none scheduled [facility]

## 2014-05-13 NOTE — Telephone Encounter (Signed)
I will send 90 day supply.  pls call pt: Advise She needs appt in June. Also ask who she is seeing for Lupus? & request most recent 1 yr records.

## 2014-05-14 NOTE — Telephone Encounter (Signed)
Patient has an appointment on 05/15/14 @ 10:45am

## 2014-05-15 ENCOUNTER — Ambulatory Visit (INDEPENDENT_AMBULATORY_CARE_PROVIDER_SITE_OTHER): Payer: BLUE CROSS/BLUE SHIELD | Admitting: Nurse Practitioner

## 2014-05-15 ENCOUNTER — Encounter: Payer: Self-pay | Admitting: Nurse Practitioner

## 2014-05-15 VITALS — BP 126/86 | HR 64 | Temp 98.2°F | Ht 66.0 in | Wt 174.0 lb

## 2014-05-15 DIAGNOSIS — M329 Systemic lupus erythematosus, unspecified: Secondary | ICD-10-CM

## 2014-05-15 DIAGNOSIS — Z114 Encounter for screening for human immunodeficiency virus [HIV]: Secondary | ICD-10-CM

## 2014-05-15 DIAGNOSIS — I10 Essential (primary) hypertension: Secondary | ICD-10-CM | POA: Diagnosis not present

## 2014-05-15 DIAGNOSIS — Z0000 Encounter for general adult medical examination without abnormal findings: Secondary | ICD-10-CM | POA: Diagnosis not present

## 2014-05-15 DIAGNOSIS — K219 Gastro-esophageal reflux disease without esophagitis: Secondary | ICD-10-CM | POA: Diagnosis not present

## 2014-05-15 LAB — COMPREHENSIVE METABOLIC PANEL
ALT: 68 U/L — ABNORMAL HIGH (ref 0–35)
AST: 44 U/L — AB (ref 0–37)
Albumin: 4.3 g/dL (ref 3.5–5.2)
Alkaline Phosphatase: 92 U/L (ref 39–117)
BUN: 9 mg/dL (ref 6–23)
CHLORIDE: 107 meq/L (ref 96–112)
CO2: 26 mEq/L (ref 19–32)
Calcium: 9.6 mg/dL (ref 8.4–10.5)
Creatinine, Ser: 0.74 mg/dL (ref 0.40–1.20)
GFR: 90.2 mL/min (ref >60.00)
Glucose, Bld: 95 mg/dL (ref 70–99)
POTASSIUM: 4.2 meq/L (ref 3.5–5.1)
SODIUM: 139 meq/L (ref 135–145)
Total Bilirubin: 0.7 mg/dL (ref 0.2–1.2)
Total Protein: 7.2 g/dL (ref 6.0–8.3)

## 2014-05-15 LAB — CBC WITH DIFFERENTIAL/PLATELET
Basophils Absolute: 0 10*3/uL (ref 0.0–0.1)
Basophils Relative: 0.5 % (ref 0.0–3.0)
EOS ABS: 0.1 10*3/uL (ref 0.0–0.7)
EOS PCT: 2.4 % (ref 0.0–5.0)
HEMATOCRIT: 42.9 % (ref 36.0–46.0)
Hemoglobin: 14.5 g/dL (ref 12.0–15.0)
Lymphocytes Relative: 43.8 % (ref 12.0–46.0)
Lymphs Abs: 2.1 10*3/uL (ref 0.7–4.0)
MCHC: 33.9 g/dL (ref 30.0–36.0)
MCV: 90.4 fl (ref 78.0–100.0)
MONOS PCT: 7.7 % (ref 3.0–12.0)
Monocytes Absolute: 0.4 10*3/uL (ref 0.1–1.0)
Neutro Abs: 2.1 10*3/uL (ref 1.4–7.7)
Neutrophils Relative %: 45.6 % (ref 43.0–77.0)
PLATELETS: 208 10*3/uL (ref 150.0–400.0)
RBC: 4.74 Mil/uL (ref 3.87–5.11)
RDW: 14.3 % (ref 11.5–15.5)
WBC: 4.7 10*3/uL (ref 4.0–10.5)

## 2014-05-15 LAB — SEDIMENTATION RATE: Sed Rate: 13 mm/hr (ref 0–22)

## 2014-05-15 LAB — HEMOGLOBIN A1C: Hgb A1c MFr Bld: 5.8 % (ref 4.6–6.5)

## 2014-05-15 LAB — VITAMIN D 25 HYDROXY (VIT D DEFICIENCY, FRACTURES): VITD: 20.24 ng/mL — AB (ref 30.00–100.00)

## 2014-05-15 LAB — TSH: TSH: 0.82 u[IU]/mL (ref 0.35–4.50)

## 2014-05-15 LAB — MICROALBUMIN / CREATININE URINE RATIO
Creatinine,U: 118.4 mg/dL
Microalb Creat Ratio: 0.6 mg/g (ref 0.0–30.0)

## 2014-05-15 LAB — URINALYSIS, MICROSCOPIC ONLY
RBC / HPF: NONE SEEN (ref 0–?)
WBC UA: NONE SEEN (ref 0–?)

## 2014-05-15 LAB — LIPID PANEL
CHOL/HDL RATIO: 6
Cholesterol: 217 mg/dL — ABNORMAL HIGH (ref 0–200)
HDL: 36.9 mg/dL — ABNORMAL LOW (ref >39.00)
LDL Cholesterol: 157 mg/dL — ABNORMAL HIGH (ref 0–99)
NonHDL: 180.1
Triglycerides: 116 mg/dL (ref 0.0–149.0)
VLDL: 23.2 mg/dL (ref 0.0–40.0)

## 2014-05-15 LAB — VITAMIN B12: Vitamin B-12: 406 pg/mL (ref 211–911)

## 2014-05-15 MED ORDER — ESOMEPRAZOLE MAGNESIUM 20 MG PO CPDR: DELAYED_RELEASE_CAPSULE | ORAL | Status: DC

## 2014-05-15 MED ORDER — ALIGN 4 MG PO CAPS: 1.0000 | ORAL_CAPSULE | Freq: Every day | ORAL | Status: DC

## 2014-05-15 MED ORDER — METOPROLOL SUCCINATE ER 50 MG PO TB24: 150.0000 mg | ORAL_TABLET | Freq: Every day | ORAL | Status: DC

## 2014-05-15 NOTE — Progress Notes (Signed)
Subjective:     Rebecca PicketStephanie H Boyle is a 44 y.o. female presents for f/u HTN & reflux. She has Hx SLE, last flare involving rash was 8 mos ago. She is not seeing rheumatology, but is taking PLQ 200 mg qd. SLE was diagnosed by Rebecca SavoyShaili Boyle. HTN: well controlled. No SE current meds. Toprol & lisinopril. No recent dose changes.  RF: overweight, SLE, SSRI use. No end organ damage. RefluX: Decreased nexium to 20 mg from 40 mg at last OV. No symptoms on lower dose. Discussed food triggers. Discussed decreasing to 3-4 times/ week dosing, potentially d/c if can control symptoms with H2 blocker or diet. Discussed benefits of starting probiotic. SLE: no evidence of kidney involvement in past labs. Joints involved-hands, knees, hips. Persistently swollen & achy hands & feet. Occasional mouth lesion. Sun exposure rash. Depression. Last eye exam 6 mos ago. No evidence of PLQ retinopathy. Hx of cardio-mediated syncope for which she is on toprol. Echo 2014, nml. CXR 2014 nml. Denies cough, SOB, palpitations, fever, weight loss.  The following portions of the patient's history were reviewed and updated as appropriate: allergies, current medications, past medical history, past social history, past surgical history and problem list.  Review of Systems Pertinent items are noted in HPI.    Objective:    BP 126/86 mmHg  Pulse 64  Temp(Src) 98.2 F (36.8 C) (Oral)  Ht 5\' 6"  (1.676 m)  Wt 174 lb (78.926 kg)  BMI 28.10 kg/m2  SpO2 98% BP 126/86 mmHg  Pulse 64  Temp(Src) 98.2 F (36.8 C) (Oral)  Ht 5\' 6"  (1.676 m)  Wt 174 lb (78.926 kg)  BMI 28.10 kg/m2  SpO2 98% General appearance: alert, cooperative, appears stated age and no distress Head: Normocephalic, without obvious abnormality, atraumatic Eyes: negative findings: lids and lashes normal and conjunctivae and sclerae normal Throat: lips, mucosa, and tongue normal; teeth and gums normal  Upper plate, appears to be screwed in Neck: no adenopathy, no  carotid bruit, supple, symmetrical, trachea midline and thyroid not enlarged, symmetric, no tenderness/mass/nodules Lungs: clear to auscultation bilaterally Heart: regular rate and rhythm, S1, S2 normal, no murmur, click, rub or gallop Extremities: edema diffuse edema hands as seen in CREST Pulses: 2+ and symmetric Skin: Skin color, texture, turgor normal. No rashes or lesions Lymph nodes: Cervical adenopathy: none and Supraclavicular adenopathy: none Neurologic: Grossly normal    Assessment:Plan   1. Essential hypertension Well controlled No med changes - metoprolol succinate (TOPROL-XL) 50 MG 24 hr tablet; Take 3 tablets (150 mg total) by mouth daily. Take with or immediately following a meal.  Dispense: 270 tablet; Refill: 0 - Lipid panel - Comprehensive metabolic panel  2. Gastroesophageal reflux disease without esophagitis Decrease nexium, wean off if possible Discussed diet triggers - esomeprazole (NEXIUM) 20 MG capsule; Take 1T PO 3 to 4 times/week  Dispense: 90 capsule; Refill: 0 - Probiotic Product (ALIGN) 4 MG CAPS; Take 1 capsule by mouth daily.  Dispense: 90 capsule; Refill: 0 - H. pylori antibody, IgG  3. Screening for HIV (human immunodeficiency virus) - HIV antibody  4. Systemic lupus Will need to see rheum if has flare or labs indicate flare. - Antinuclear Antibodies, IFA - Sedimentation rate - Comprehensive metabolic panel - CBC with Differential/Platelet - C3 and C4 - Urine Microscopic - Microalbumin / creatinine urine ratio  5. Preventative health care - Hemoglobin A1c - Lipid panel - Vit D  25 hydroxy (rtn osteoporosis monitoring) - Vitamin B12 - TSH  F/u 6 mos

## 2014-05-15 NOTE — Patient Instructions (Addendum)
My office will call with lab results.  Start probiotic. Take daily for 3 months.   Decrease nexium to 2 to 3 times weekly for 2 weeks. If no return of symptoms, Take as needed. Avoid food triggers: coffee, alcohol, oily foods, overeating, refined carbs, tomato/spicy.  Do not lay down within 2 hours of eating.  Use 30 spf sunscreen or higher at all times when in sun, wear hat, sleeves, etc. Sunlight flares lupus.  Limit salt to 2400 mg daily-this may help with hand swelling. Drink water-about 48 oz daily. Eat fresh fruit daily-berries & citrus.  See me in 6 mos. Nice to see you!

## 2014-05-15 NOTE — Progress Notes (Signed)
Pre visit review using our clinic review tool, if applicable. No additional management support is needed unless otherwise documented below in the visit note. 

## 2014-05-16 LAB — C3 AND C4
C3 Complement: 165 mg/dL (ref 90–180)
C4 Complement: 22 mg/dL (ref 10–40)

## 2014-05-16 LAB — HIV ANTIBODY (ROUTINE TESTING W REFLEX): HIV 1&2 Ab, 4th Generation: NONREACTIVE

## 2014-05-18 ENCOUNTER — Telehealth: Payer: Self-pay | Admitting: Nurse Practitioner

## 2014-05-18 ENCOUNTER — Telehealth (HOSPITAL_COMMUNITY): Payer: Self-pay | Admitting: *Deleted

## 2014-05-18 DIAGNOSIS — R748 Abnormal levels of other serum enzymes: Secondary | ICD-10-CM

## 2014-05-18 DIAGNOSIS — R7303 Prediabetes: Secondary | ICD-10-CM

## 2014-05-18 DIAGNOSIS — F419 Anxiety disorder, unspecified: Principal | ICD-10-CM

## 2014-05-18 DIAGNOSIS — E559 Vitamin D deficiency, unspecified: Secondary | ICD-10-CM

## 2014-05-18 DIAGNOSIS — F329 Major depressive disorder, single episode, unspecified: Secondary | ICD-10-CM

## 2014-05-18 LAB — H. PYLORI ANTIBODY, IGG: H PYLORI IGG: NEGATIVE

## 2014-05-18 NOTE — Telephone Encounter (Signed)
pls call pt: Advise Labs are pretty good. A few concerns: Vit D too low. Sent script for 12 weeks She is prediabetic, so very important to avoid sugar & refined grains to decrease risk. Cut anything that is sweet when you eat or drink it except fresh fruit. Cut out refined grains- bread, rolls, biscuits, bagels, muffins, pasta and cereals that have less than 4 gm fiber per serving. Exercise 30 minutes daily-take a walking is good. Liver enzymes slightly high. Avoid alcoholic beverages & tylenol/acetaminophen. Take ibuporphen if needed. Bad cholesterol is too high. Cut out cheese. Limit meat to 3 to 4 times week. Eat fruits, vegetables, beans, nuts, & seeds. Lupus labs are nml, still waiting on ANA. Sched OV if questions. Sched lab appt in 12 weeks to check A1c & vit D, & repeat liver enzymes.

## 2014-05-18 NOTE — Telephone Encounter (Signed)
Called and informed patient of results. Patient has no questions and will CB to schedule lab appt.   Told patient to CB with any questions.

## 2014-05-18 NOTE — Telephone Encounter (Signed)
Pt called for a refill for LORazepam (ATIVAN) 0.5 MG tablet and OLANZapine (ZYPREXA) 10 MG tablet. Please call pt once prescription are ready.

## 2014-05-19 LAB — ANTINUCLEAR ANTIBODIES, IFA: ANA TITER 1: NEGATIVE

## 2014-05-20 ENCOUNTER — Telehealth: Payer: Self-pay | Admitting: Nurse Practitioner

## 2014-05-20 NOTE — Telephone Encounter (Signed)
LMOVM-identified about lab results. Patient to call back with any questions or concerns. DPR Signed.  

## 2014-05-20 NOTE — Telephone Encounter (Signed)
pls call pt: Advise ANA is neg. Continue Plaquenil for now.

## 2014-05-21 ENCOUNTER — Telehealth: Payer: Self-pay | Admitting: Nurse Practitioner

## 2014-05-21 MED ORDER — OLANZAPINE 10 MG PO TABS: 10.0000 mg | ORAL_TABLET | Freq: Every day | ORAL | Status: DC

## 2014-05-21 MED ORDER — LORAZEPAM 0.5 MG PO TABS: ORAL_TABLET | ORAL | Status: DC

## 2014-05-21 NOTE — Telephone Encounter (Signed)
Ativan and olanzapine printed for refill.

## 2014-05-21 NOTE — Telephone Encounter (Signed)
Please advise? I don't see where you sent in the script?

## 2014-05-21 NOTE — Telephone Encounter (Signed)
[facility] is [facility]

## 2014-05-22 ENCOUNTER — Other Ambulatory Visit: Payer: Self-pay | Admitting: Nurse Practitioner

## 2014-05-22 DIAGNOSIS — E559 Vitamin D deficiency, unspecified: Secondary | ICD-10-CM

## 2014-05-22 MED ORDER — VITAMIN D3 1.25 MG (50000 UT) PO CAPS: 1.0000 | ORAL_CAPSULE | ORAL | Status: DC

## 2014-05-27 ENCOUNTER — Ambulatory Visit (HOSPITAL_COMMUNITY): Payer: Self-pay | Admitting: Psychiatry

## 2014-06-08 ENCOUNTER — Ambulatory Visit (INDEPENDENT_AMBULATORY_CARE_PROVIDER_SITE_OTHER): Payer: BLUE CROSS/BLUE SHIELD | Admitting: Psychiatry

## 2014-06-08 ENCOUNTER — Encounter (HOSPITAL_COMMUNITY): Payer: Self-pay | Admitting: Psychiatry

## 2014-06-08 VITALS — BP 118/70 | HR 65 | Ht 66.0 in | Wt 176.0 lb

## 2014-06-08 DIAGNOSIS — F410 Panic disorder [episodic paroxysmal anxiety] without agoraphobia: Secondary | ICD-10-CM

## 2014-06-08 DIAGNOSIS — F4310 Post-traumatic stress disorder, unspecified: Secondary | ICD-10-CM | POA: Diagnosis not present

## 2014-06-08 DIAGNOSIS — F331 Major depressive disorder, recurrent, moderate: Secondary | ICD-10-CM | POA: Diagnosis not present

## 2014-06-08 DIAGNOSIS — F419 Anxiety disorder, unspecified: Principal | ICD-10-CM

## 2014-06-08 DIAGNOSIS — F3341 Major depressive disorder, recurrent, in partial remission: Secondary | ICD-10-CM

## 2014-06-08 DIAGNOSIS — F329 Major depressive disorder, single episode, unspecified: Secondary | ICD-10-CM

## 2014-06-08 MED ORDER — ESCITALOPRAM OXALATE 10 MG PO TABS: 10.0000 mg | ORAL_TABLET | Freq: Every day | ORAL | Status: DC

## 2014-06-08 MED ORDER — OLANZAPINE 10 MG PO TABS: 10.0000 mg | ORAL_TABLET | Freq: Every day | ORAL | Status: DC

## 2014-06-08 MED ORDER — TOPIRAMATE 25 MG PO TABS: 50.0000 mg | ORAL_TABLET | Freq: Two times a day (BID) | ORAL | Status: DC

## 2014-06-08 MED ORDER — LORAZEPAM 0.5 MG PO TABS: ORAL_TABLET | ORAL | Status: DC

## 2014-06-08 NOTE — Progress Notes (Signed)
Patient ID: Rebecca Boyle, female   DOB: 12/04/1970, 44 y.o.   MRN: [id number]  [facility] Follow up visit  Rebecca Boyle [id number] 44 y.o.  06/08/2014 1:24 PM  Chief Complaint:  Depression and anxiety.  History of Present Illness:   Patient Presents for follow up for Major depression, GAD and Panic disorder. Initially was referred  by her primary care physician for depression and panic attacks.   Aggravating factors include her daughter lost her baby because of trisomy. That upset the patient also at that happened in December. Other daughter had threatened suicide she was having difficult time in the community she is a homosexual and she was getting that threats from the community. She also takes care of her mom.  She was also having somewhat high pulse or blood pressure on Effexor so we discussed option of stopping that slowly and starting on Lexapro.  She is tolerating Lexapro 10 mg. Her depression is improved. Her hemoglobin A1c was done by her primary care is is 5.9 and slightly higher show she is worried about any relationship to Zyprexa. His no clear manic symptoms currently or in the past she has had depression and some mood swings for which she was started on Zyprexa.  Her panic symptoms are recently controlled with Ativan.  Modifying factors; she is very close to her family and supportive fianc and her daughter's. She works as a Designer, jewelleryregistered nurse in the past, But not as of now.  She believes the medication is working but still has to take xanax. She feels lexapro has helped some.      Past Psychiatric History/Hospitalization(s)  Age 44 she was admitted at: Because of depression. She has been on different medications since then. There is no clear indication of what is the cause of depression at that time. Most of suicide patient also has never attempted suicide  Hospitalization for psychiatric illness: Yes History of Electroconvulsive  Shock Therapy: No Prior Suicide Attempts: No  Medical History; Past Medical History  Diagnosis Date  . Arthritis     hands, hips, knees, back feet  . Depression   . Lupus 2004  . PONV (postoperative nausea and vomiting)   . Syncope, cardiogenic 2002    treated with toprol  . Anxiety   . Heart murmur   . Chicken pox as a child  . Hypertension   . Tachycardia 06/29/2011  . Anxiety and depression 01/04/2011  . Headache disorder 05/16/2011  . Pain, dental 07/19/2011  . Pedal edema 08/14/2011  . Sinusitis acute 08/29/2011  . Knee pain, right 11/12/2011  . UTI (lower urinary tract infection) 11/27/2011  . Preventative health care 07/07/2012  . Poor dentition 06/22/2013    Allergies: Allergies  Allergen Reactions  . Morphine And Related Shortness Of Breath and Itching  . Tylenol [Acetaminophen] Rash    Pt states she has liver damage, and is also taking plaquenil  . Codeine Itching and Nausea And Vomiting    Medications: Outpatient Encounter Prescriptions as of 06/08/2014  Medication Sig  . albuterol (PROVENTIL HFA;VENTOLIN HFA) 108 (90 BASE) MCG/ACT inhaler Inhale 2 puffs into the lungs every 4 (four) hours as needed for wheezing. For exercise induced asthma  . Cholecalciferol (VITAMIN D3) 50000 UNITS CAPS Take 1 capsule by mouth every 7 (seven) days.  . diclofenac sodium (VOLTAREN) 1 % GEL Apply 4 g topically 4 (four) times daily as needed.  Marland Kitchen. escitalopram (LEXAPRO) 10 MG tablet Take 1 tablet (10 mg total) by  mouth daily.  Marland Kitchen esomeprazole (NEXIUM) 20 MG capsule Take 1T PO 3 to 4 times/week  . Estradiol 0.75 MG/1.25 GM (0.06%) topical gel Place 1.25 g onto the skin daily.  . hydroxychloroquine (PLAQUENIL) 200 MG tablet Take 100 mg by mouth daily.  Marland Kitchen lisinopril (PRINIVIL,ZESTRIL) 20 MG tablet Take 0.5 tablets (10 mg total) by mouth 2 (two) times daily.  Melene Muller ON 06/26/2014] LORazepam (ATIVAN) 0.5 MG tablet Fill on or after may 27th. 2016  . metoprolol succinate (TOPROL-XL) 50 MG 24 hr  tablet Take 3 tablets (150 mg total) by mouth daily. Take with or immediately following a meal.  . Multiple Vitamin (MULITIVITAMIN WITH MINERALS) TABS Take 1 tablet by mouth daily.  Marland Kitchen OLANZapine (ZYPREXA) 10 MG tablet Take 1 tablet (10 mg total) by mouth daily.  . Probiotic Product (ALIGN) 4 MG CAPS Take 1 capsule by mouth daily.  . SUMAtriptan (IMITREX) 50 MG tablet Take 1 tablet (50 mg total) by mouth every 2 (two) hours as needed for migraine or headache. May repeat in 2 hours if headache persists or recurs.  . [DISCONTINUED] escitalopram (LEXAPRO) 10 MG tablet Take 1 tablet (10 mg total) by mouth daily.  . [DISCONTINUED] LORazepam (ATIVAN) 0.5 MG tablet Twice a day  . [DISCONTINUED] OLANZapine (ZYPREXA) 10 MG tablet Take 1 tablet (10 mg total) by mouth daily.  Marland Kitchen topiramate (TOPAMAX) 25 MG tablet Take 2 tablets (50 mg total) by mouth 2 (two) times daily.   No facility-administered encounter medications on file as of 06/08/2014.     Substance Abuse History: Denies  Family History; Family History  Problem Relation Age of Onset  . Hyperlipidemia Mother   . Hypertension Mother   . Stroke Mother     X 5 mini  . Cancer Mother 72    cervical  . Heart disease Mother   . Bipolar disorder Mother   . Hypertension Father   . Heart disease Father     cad with stent  . Arthritis Father     2 blown discs  . Stroke Maternal Grandmother   . Lupus Maternal Grandmother   . Heart disease Paternal Grandfather       Labs:  Recent Results (from the past 2160 hour(s))  Antinuclear Antibodies, IFA     Status: None   Collection Time: 05/15/14 12:00 AM  Result Value Ref Range   ANA Titer 1 Negative     Comment:                                      Negative   <1:80                                      Borderline  1:80                                      Positive   >1:80   Hemoglobin A1c     Status: None   Collection Time: 05/15/14 11:36 AM  Result Value Ref Range   Hgb A1c MFr Bld 5.8 4.6  - 6.5 %    Comment: Glycemic Control Guidelines for People with Diabetes:Non Diabetic:  <6%Goal of Therapy: <7%Additional Action Suggested:  >8%   Lipid panel  Status: Abnormal   Collection Time: 05/15/14 11:36 AM  Result Value Ref Range   Cholesterol 217 (H) 0 - 200 mg/dL    Comment: ATP III Classification       Desirable:  < 200 mg/dL               Borderline High:  200 - 239 mg/dL          High:  > = 161 mg/dL   Triglycerides 096.0 0.0 - 149.0 mg/dL    Comment: Normal:  <454 mg/dLBorderline High:  150 - 199 mg/dL   HDL 09.81 (L) >19.14 mg/dL   VLDL 78.2 0.0 - 95.6 mg/dL   LDL Cholesterol 213 (H) 0 - 99 mg/dL   Total CHOL/HDL Ratio 6     Comment:                Men          Women1/2 Average Risk     3.4          3.3Average Risk          5.0          4.42X Average Risk          9.6          7.13X Average Risk          15.0          11.0                       NonHDL 180.10     Comment: NOTE:  Non-HDL goal should be 30 mg/dL higher than patient's LDL goal (i.e. LDL goal of < 70 mg/dL, would have non-HDL goal of < 100 mg/dL)  Sedimentation rate     Status: None   Collection Time: 05/15/14 11:36 AM  Result Value Ref Range   Sed Rate 13 0 - 22 mm/hr  Comprehensive metabolic panel     Status: Abnormal   Collection Time: 05/15/14 11:36 AM  Result Value Ref Range   Sodium 139 135 - 145 mEq/L   Potassium 4.2 3.5 - 5.1 mEq/L   Chloride 107 96 - 112 mEq/L   CO2 26 19 - 32 mEq/L   Glucose, Bld 95 70 - 99 mg/dL   BUN 9 6 - 23 mg/dL   Creatinine, Ser 0.86 0.40 - 1.20 mg/dL   Total Bilirubin 0.7 0.2 - 1.2 mg/dL   Alkaline Phosphatase 92 39 - 117 U/L   AST 44 (H) 0 - 37 U/L   ALT 68 (H) 0 - 35 U/L   Total Protein 7.2 6.0 - 8.3 g/dL   Albumin 4.3 3.5 - 5.2 g/dL   Calcium 9.6 8.4 - 57.8 mg/dL   GFR 46.96 >29.52 mL/min  CBC with Differential/Platelet     Status: None   Collection Time: 05/15/14 11:36 AM  Result Value Ref Range   WBC 4.7 4.0 - 10.5 K/uL   RBC 4.74 3.87 - 5.11 Mil/uL    Hemoglobin 14.5 12.0 - 15.0 g/dL   HCT 84.1 32.4 - 40.1 %   MCV 90.4 78.0 - 100.0 fl   MCHC 33.9 30.0 - 36.0 g/dL   RDW 02.7 25.3 - 66.4 %   Platelets 208.0 150.0 - 400.0 K/uL   Neutrophils Relative % 45.6 43.0 - 77.0 %   Lymphocytes Relative 43.8 12.0 - 46.0 %   Monocytes Relative 7.7 3.0 - 12.0 %   Eosinophils Relative 2.4 0.0 - 5.0 %  Basophils Relative 0.5 0.0 - 3.0 %   Neutro Abs 2.1 1.4 - 7.7 K/uL   Lymphs Abs 2.1 0.7 - 4.0 K/uL   Monocytes Absolute 0.4 0.1 - 1.0 K/uL   Eosinophils Absolute 0.1 0.0 - 0.7 K/uL   Basophils Absolute 0.0 0.0 - 0.1 K/uL  Urine Microscopic     Status: None   Collection Time: 05/15/14 11:36 AM  Result Value Ref Range   WBC, UA none seen 0-2/hpf   RBC / HPF none seen 0-2/hpf   Squamous Epithelial / LPF Rare(0-4/hpf) Rare(0-4/hpf)  Microalbumin / creatinine urine ratio     Status: None   Collection Time: 05/15/14 11:36 AM  Result Value Ref Range   Microalb, Ur <0.7 0.0 - 1.9 mg/dL   Creatinine,U 161.0 mg/dL   Microalb Creat Ratio 0.6 0.0 - 30.0 mg/g  Vit D  25 hydroxy (rtn osteoporosis monitoring)     Status: Abnormal   Collection Time: 05/15/14 11:36 AM  Result Value Ref Range   VITD 20.24 (L) 30.00 - 100.00 ng/mL  Vitamin B12     Status: None   Collection Time: 05/15/14 11:36 AM  Result Value Ref Range   Vitamin B-12 406 211 - 911 pg/mL  TSH     Status: None   Collection Time: 05/15/14 11:36 AM  Result Value Ref Range   TSH 0.82 0.35 - 4.50 uIU/mL  H. pylori antibody, IgG     Status: None   Collection Time: 05/15/14 11:36 AM  Result Value Ref Range   H Pylori IgG Negative Negative  C3 and C4     Status: None   Collection Time: 05/15/14 11:37 AM  Result Value Ref Range   C3 Complement 165 90 - 180 mg/dL   C4 Complement 22 10 - 40 mg/dL  HIV antibody     Status: None   Collection Time: 05/15/14 11:37 AM  Result Value Ref Range   HIV 1&2 Ab, 4th Generation NONREACTIVE NONREACTIVE    Comment:   A NONREACTIVE HIV Ag/Ab result does not  exclude HIV infection since the time frame for seroconversion is variable. If acute HIV infection is suspected, a HIV-1 RNA Qualitative TMA test is recommended.   HIV-1/2 Antibody Diff         Not indicated. HIV-1 RNA, Qual TMA           Not indicated.   PLEASE NOTE: This information has been disclosed to you from records whose confidentiality may be protected by state law. If your state requires such protection, then the state law prohibits you from making any further disclosure of the information without the specific written consent of the person to whom it pertains, or as otherwise permitted by law. A general authorization for the release of medical or other information is NOT sufficient for this purpose.   The performance of this assay has not been clinically validated in patients less than 59 years old.        Musculoskeletal: Strength & Muscle Tone: within normal limits Gait & Station: normal Patient leans: N/A  Mental Status Examination;   Psychiatric Specialty Exam: Physical Exam  Constitutional: She appears well-developed and well-nourished. No distress.    Review of Systems  Constitutional: Negative.   Gastrointestinal: Negative for nausea.  Skin: Negative for rash.  Psychiatric/Behavioral: Negative for suicidal ideas and substance abuse.    Blood pressure 118/70, pulse 65, height 5\' 6"  (1.676 m), weight 176 lb (79.833 kg), SpO2 94 %.Body mass index is 28.42 kg/(m^2).  General Appearance: Casual  Eye Contact::  Fair  Speech:  Slow  Volume:  Normal  Mood:  Not hopeless or dysthymic  Affect:  Congruent  Thought Process:  Coherent  Orientation:  Full (Time, Place, and Person)  Thought Content:  Rumination  Suicidal Thoughts:  No  Homicidal Thoughts:  No  Memory:  Immediate;   Fair Recent;   Fair  Judgement:  Fair  Insight:  Fair  Psychomotor Activity:  Normal  Concentration:  Fair  Recall:  Fair  Akathisia:  No  Handed:  Right  AIMS (if indicated):      Assets:  Communication Skills Desire for Improvement Physical Health Social Support  Sleep:        Assessment: Axis I: Major depressive 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 Panic disorder without agoraphobia. PTSD  Axis II: Deferred  Axis III:  Past Medical History  Diagnosis Date  . Arthritis     hands, hips, knees, back feet  . Depression   . Lupus 2004  . PONV (postoperative nausea and vomiting)   . Syncope, cardiogenic 2002    treated with toprol  . Anxiety   . Heart murmur   . Chicken pox as a child  . Hypertension   . Tachycardia 06/29/2011  . Anxiety and depression 01/04/2011  . Headache disorder 05/16/2011  . Pain, dental 07/19/2011  . Pedal edema 08/14/2011  . Sinusitis acute 08/29/2011  . Knee pain, right 11/12/2011  . UTI (lower urinary tract infection) 11/27/2011  . Preventative health care 07/07/2012  . Poor dentition 06/22/2013    Axis IV: Psychosocial.    Treatment Plan and Summary: Continue Lexapro 10 mg for her PTSD and depression. Her labs were reviewed hemoglobin A1c is on the high normal site. Slowly taper down and discontinue Zyprexa 10 mg tablets given.  Started on Topamax 25 mg increase to 50 mg the next 1 week for mood stabiilzation and augmentation for depression  Continue Ativan for now for her anxiety. And panic attacks. Slowly taper down once she continues to respond to lexapro.   Pertinent Labs and Relevant Prior Notes reviewed. Medication Side effects, benefits and risks reviewed/discussed with Patient. Time given for patient to respond and asks questions regarding the Diagnosis and Medications. Safety concerns and to report to ER if suicidal or call 911. Relevant Medications refilled or called in to pharmacy. Discussed weight maintenance and Sleep Hygiene. Follow up with Primary care provider in regards to Medical conditions. Recommend compliance with medications and follow up office appointments. Discussed to avail opportunity to  consider or/and continue Individual therapy with Counselor. Greater than 50% of time was spend in counseling and coordination of care with the patient.  Schedule for Follow up visit in 4 weeks  or call in earlier as necessary.   Thresa RossAKHTAR, Berta Denson, MD 06/08/2014

## 2014-07-03 ENCOUNTER — Ambulatory Visit (HOSPITAL_COMMUNITY): Payer: Self-pay | Admitting: Licensed Clinical Social Worker

## 2014-07-07 ENCOUNTER — Ambulatory Visit (HOSPITAL_COMMUNITY): Payer: Self-pay | Admitting: Psychiatry

## 2014-07-08 ENCOUNTER — Telehealth (HOSPITAL_COMMUNITY): Payer: Self-pay | Admitting: *Deleted

## 2014-07-08 MED ORDER — TOPIRAMATE 25 MG PO TABS: 50.0000 mg | ORAL_TABLET | Freq: Two times a day (BID) | ORAL | Status: DC

## 2014-07-08 NOTE — Telephone Encounter (Signed)
Pt called for a refill for Topamax 25mg . Per Dr. Gilmore LarocheAkhtar, pt is authorized for a refill for Topamax 25mg , Qty 60. Prescription was sent to [facility]([place], [place]). Pt has a f/u appt on 6/13. Called and informed pt of prescription status. Pt states and shows understanding.

## 2014-07-09 ENCOUNTER — Ambulatory Visit (HOSPITAL_COMMUNITY): Payer: Self-pay | Admitting: Psychiatry

## 2014-07-10 ENCOUNTER — Ambulatory Visit (HOSPITAL_COMMUNITY): Payer: Self-pay | Admitting: Licensed Clinical Social Worker

## 2014-07-13 ENCOUNTER — Ambulatory Visit (INDEPENDENT_AMBULATORY_CARE_PROVIDER_SITE_OTHER): Payer: BLUE CROSS/BLUE SHIELD | Admitting: Psychiatry

## 2014-07-13 ENCOUNTER — Encounter (HOSPITAL_COMMUNITY): Payer: Self-pay | Admitting: Psychiatry

## 2014-07-13 VITALS — BP 116/64 | HR 51 | Ht 66.0 in | Wt 169.0 lb

## 2014-07-13 DIAGNOSIS — F410 Panic disorder [episodic paroxysmal anxiety] without agoraphobia: Secondary | ICD-10-CM

## 2014-07-13 DIAGNOSIS — F329 Major depressive disorder, single episode, unspecified: Secondary | ICD-10-CM

## 2014-07-13 DIAGNOSIS — F4310 Post-traumatic stress disorder, unspecified: Secondary | ICD-10-CM | POA: Diagnosis not present

## 2014-07-13 DIAGNOSIS — F331 Major depressive disorder, recurrent, moderate: Secondary | ICD-10-CM | POA: Diagnosis not present

## 2014-07-13 DIAGNOSIS — F419 Anxiety disorder, unspecified: Principal | ICD-10-CM

## 2014-07-13 DIAGNOSIS — F3341 Major depressive disorder, recurrent, in partial remission: Secondary | ICD-10-CM

## 2014-07-13 MED ORDER — DIVALPROEX SODIUM 250 MG PO DR TAB: 500.0000 mg | DELAYED_RELEASE_TABLET | Freq: Every day | ORAL | Status: DC

## 2014-07-13 MED ORDER — ESCITALOPRAM OXALATE 10 MG PO TABS: 10.0000 mg | ORAL_TABLET | Freq: Every day | ORAL | Status: DC

## 2014-07-13 MED ORDER — LORAZEPAM 0.5 MG PO TABS: ORAL_TABLET | ORAL | Status: DC

## 2014-07-13 NOTE — Progress Notes (Signed)
Patient ID: Rebecca Boyle, female   DOB: December 31, 1970, 44 y.o.   MRN: [id number]  [facility] Outpatient Follow up visit  PRAPTI FLOW [id number] 44 y.o.  07/13/2014 4:26 PM  Chief Complaint:  Depression and anxiety.  History of Present Illness:   Patient Presents for follow up for Major depression, GAD and Panic disorder. Initially was referred  by her primary care physician for depression and panic attacks.   Aggravating factors include her daughter lost her baby because of trisomy. That upset the patient also at that happened in December. Other daughter had threatened suicide she was having difficult time in the community she is a homosexual and she was getting that threats from the community. She also takes care of her mom.  She was also having somewhat high pulse or blood pressure on Effexor so we discussed option of stopping that slowly and starting on Lexapro.  She's been taking her Lexapro. Last visit we slowly cut down on her Zyprexa because of her hemoglobin A1c was high. We started her on Topamax. Now she is at dose of 50 mg. She's feeling anxious she feels Topamax is causing some side effects and she endorses her anxiety level has gone up. In the past she has been on Depakote says was helpful and she remains reluctant about olanzapine.  Ativan helps her panic symptoms but still endorses worrying excessive recently.  Modifying factors; she is very close to her family and supportive fianc and her daughter's. She works as a Designer, jewellery in the past, But not as of now.  She believes the medication is working but still has to take xanax. She feels lexapro has helped some.  Prior Suicide Attempts: No  Medical History; Past Medical History  Diagnosis Date  . Arthritis     hands, hips, knees, back feet  . Depression   . Lupus 2004  . PONV (postoperative nausea and vomiting)   . Syncope, cardiogenic 2002    treated with toprol  . Anxiety   . Heart  murmur   . Chicken pox as a child  . Hypertension   . Tachycardia 06/29/2011  . Anxiety and depression 01/04/2011  . Headache disorder 05/16/2011  . Pain, dental 07/19/2011  . Pedal edema 08/14/2011  . Sinusitis acute 08/29/2011  . Knee pain, right 11/12/2011  . UTI (lower urinary tract infection) 11/27/2011  . Preventative health care 07/07/2012  . Poor dentition 06/22/2013    Allergies: Allergies  Allergen Reactions  . Morphine And Related Shortness Of Breath and Itching  . Tylenol [Acetaminophen] Rash    Pt states she has liver damage, and is also taking plaquenil  . Codeine Itching and Nausea And Vomiting    Medications: Outpatient Encounter Prescriptions as of 07/13/2014  Medication Sig  . albuterol (PROVENTIL HFA;VENTOLIN HFA) 108 (90 BASE) MCG/ACT inhaler Inhale 2 puffs into the lungs every 4 (four) hours as needed for wheezing. For exercise induced asthma  . Cholecalciferol (VITAMIN D3) 50000 UNITS CAPS Take 1 capsule by mouth every 7 (seven) days.  . diclofenac sodium (VOLTAREN) 1 % GEL Apply 4 g topically 4 (four) times daily as needed.  Marland Kitchen escitalopram (LEXAPRO) 10 MG tablet Take 1 tablet (10 mg total) by mouth daily.  Marland Kitchen esomeprazole (NEXIUM) 20 MG capsule Take 1T PO 3 to 4 times/week  . Estradiol 0.75 MG/1.25 GM (0.06%) topical gel Place 1.25 g onto the skin daily.  . hydroxychloroquine (PLAQUENIL) 200 MG tablet Take 100 mg by mouth daily.  Marland Kitchen  lisinopril (PRINIVIL,ZESTRIL) 20 MG tablet Take 0.5 tablets (10 mg total) by mouth 2 (two) times daily.  Melene Muller ON 07/17/2014] LORazepam (ATIVAN) 0.5 MG tablet Fill on or after may 27th. 2016  . metoprolol succinate (TOPROL-XL) 50 MG 24 hr tablet Take 3 tablets (150 mg total) by mouth daily. Take with or immediately following a meal.  . Multiple Vitamin (MULITIVITAMIN WITH MINERALS) TABS Take 1 tablet by mouth daily.  . Probiotic Product (ALIGN) 4 MG CAPS Take 1 capsule by mouth daily.  . SUMAtriptan (IMITREX) 50 MG tablet Take 1 tablet  (50 mg total) by mouth every 2 (two) hours as needed for migraine or headache. May repeat in 2 hours if headache persists or recurs.  . [DISCONTINUED] escitalopram (LEXAPRO) 10 MG tablet Take 1 tablet (10 mg total) by mouth daily.  . [DISCONTINUED] LORazepam (ATIVAN) 0.5 MG tablet Fill on or after may 27th. 2016  . [DISCONTINUED] OLANZapine (ZYPREXA) 10 MG tablet Take 1 tablet (10 mg total) by mouth daily.  . [DISCONTINUED] topiramate (TOPAMAX) 25 MG tablet Take 2 tablets (50 mg total) by mouth 2 (two) times daily.  . divalproex (DEPAKOTE) 250 MG DR tablet Take 2 tablets (500 mg total) by mouth at bedtime.   No facility-administered encounter medications on file as of 07/13/2014.     Family History; Family History  Problem Relation Age of Onset  . Hyperlipidemia Mother   . Hypertension Mother   . Stroke Mother     X 5 mini  . Cancer Mother 57    cervical  . Heart disease Mother   . Bipolar disorder Mother   . Hypertension Father   . Heart disease Father     cad with stent  . Arthritis Father     2 blown discs  . Stroke Maternal Grandmother   . Lupus Maternal Grandmother   . Heart disease Paternal Grandfather       Labs:  Recent Results (from the past 2160 hour(s))  Antinuclear Antibodies, IFA     Status: None   Collection Time: 05/15/14 12:00 AM  Result Value Ref Range   ANA Titer 1 Negative     Comment:                                      Negative   <1:80                                      Borderline  1:80                                      Positive   >1:80   Hemoglobin A1c     Status: None   Collection Time: 05/15/14 11:36 AM  Result Value Ref Range   Hgb A1c MFr Bld 5.8 4.6 - 6.5 %    Comment: Glycemic Control Guidelines for People with Diabetes:Non Diabetic:  <6%Goal of Therapy: <7%Additional Action Suggested:  >8%   Lipid panel     Status: Abnormal   Collection Time: 05/15/14 11:36 AM  Result Value Ref Range   Cholesterol 217 (H) 0 - 200 mg/dL     Comment: ATP III Classification       Desirable:  < 200 mg/dL  Borderline High:  200 - 239 mg/dL          High:  > = 161 mg/dL   Triglycerides 096.0 0.0 - 149.0 mg/dL    Comment: Normal:  <454 mg/dLBorderline High:  150 - 199 mg/dL   HDL 09.81 (L) >19.14 mg/dL   VLDL 78.2 0.0 - 95.6 mg/dL   LDL Cholesterol 213 (H) 0 - 99 mg/dL   Total CHOL/HDL Ratio 6     Comment:                Men          Women1/2 Average Risk     3.4          3.3Average Risk          5.0          4.42X Average Risk          9.6          7.13X Average Risk          15.0          11.0                       NonHDL 180.10     Comment: NOTE:  Non-HDL goal should be 30 mg/dL higher than patient's LDL goal (i.e. LDL goal of < 70 mg/dL, would have non-HDL goal of < 100 mg/dL)  Sedimentation rate     Status: None   Collection Time: 05/15/14 11:36 AM  Result Value Ref Range   Sed Rate 13 0 - 22 mm/hr  Comprehensive metabolic panel     Status: Abnormal   Collection Time: 05/15/14 11:36 AM  Result Value Ref Range   Sodium 139 135 - 145 mEq/L   Potassium 4.2 3.5 - 5.1 mEq/L   Chloride 107 96 - 112 mEq/L   CO2 26 19 - 32 mEq/L   Glucose, Bld 95 70 - 99 mg/dL   BUN 9 6 - 23 mg/dL   Creatinine, Ser 0.86 0.40 - 1.20 mg/dL   Total Bilirubin 0.7 0.2 - 1.2 mg/dL   Alkaline Phosphatase 92 39 - 117 U/L   AST 44 (H) 0 - 37 U/L   ALT 68 (H) 0 - 35 U/L   Total Protein 7.2 6.0 - 8.3 g/dL   Albumin 4.3 3.5 - 5.2 g/dL   Calcium 9.6 8.4 - 57.8 mg/dL   GFR 46.96 >29.52 mL/min  CBC with Differential/Platelet     Status: None   Collection Time: 05/15/14 11:36 AM  Result Value Ref Range   WBC 4.7 4.0 - 10.5 K/uL   RBC 4.74 3.87 - 5.11 Mil/uL   Hemoglobin 14.5 12.0 - 15.0 g/dL   HCT 84.1 32.4 - 40.1 %   MCV 90.4 78.0 - 100.0 fl   MCHC 33.9 30.0 - 36.0 g/dL   RDW 02.7 25.3 - 66.4 %   Platelets 208.0 150.0 - 400.0 K/uL   Neutrophils Relative % 45.6 43.0 - 77.0 %   Lymphocytes Relative 43.8 12.0 - 46.0 %   Monocytes Relative  7.7 3.0 - 12.0 %   Eosinophils Relative 2.4 0.0 - 5.0 %   Basophils Relative 0.5 0.0 - 3.0 %   Neutro Abs 2.1 1.4 - 7.7 K/uL   Lymphs Abs 2.1 0.7 - 4.0 K/uL   Monocytes Absolute 0.4 0.1 - 1.0 K/uL   Eosinophils Absolute 0.1 0.0 - 0.7 K/uL   Basophils Absolute 0.0 0.0 - 0.1 K/uL  Urine Microscopic     Status: None   Collection Time: 05/15/14 11:36 AM  Result Value Ref Range   WBC, UA none seen 0-2/hpf   RBC / HPF none seen 0-2/hpf   Squamous Epithelial / LPF Rare(0-4/hpf) Rare(0-4/hpf)  Microalbumin / creatinine urine ratio     Status: None   Collection Time: 05/15/14 11:36 AM  Result Value Ref Range   Microalb, Ur <0.7 0.0 - 1.9 mg/dL   Creatinine,U 161.0 mg/dL   Microalb Creat Ratio 0.6 0.0 - 30.0 mg/g  Vit D  25 hydroxy (rtn osteoporosis monitoring)     Status: Abnormal   Collection Time: 05/15/14 11:36 AM  Result Value Ref Range   VITD 20.24 (L) 30.00 - 100.00 ng/mL  Vitamin B12     Status: None   Collection Time: 05/15/14 11:36 AM  Result Value Ref Range   Vitamin B-12 406 211 - 911 pg/mL  TSH     Status: None   Collection Time: 05/15/14 11:36 AM  Result Value Ref Range   TSH 0.82 0.35 - 4.50 uIU/mL  H. pylori antibody, IgG     Status: None   Collection Time: 05/15/14 11:36 AM  Result Value Ref Range   H Pylori IgG Negative Negative  C3 and C4     Status: None   Collection Time: 05/15/14 11:37 AM  Result Value Ref Range   C3 Complement 165 90 - 180 mg/dL   C4 Complement 22 10 - 40 mg/dL  HIV antibody     Status: None   Collection Time: 05/15/14 11:37 AM  Result Value Ref Range   HIV 1&2 Ab, 4th Generation NONREACTIVE NONREACTIVE    Comment:   A NONREACTIVE HIV Ag/Ab result does not exclude HIV infection since the time frame for seroconversion is variable. If acute HIV infection is suspected, a HIV-1 RNA Qualitative TMA test is recommended.   HIV-1/2 Antibody Diff         Not indicated. HIV-1 RNA, Qual TMA           Not indicated.   PLEASE NOTE: This  information has been disclosed to you from records whose confidentiality may be protected by state law. If your state requires such protection, then the state law prohibits you from making any further disclosure of the information without the specific written consent of the person to whom it pertains, or as otherwise permitted by law. A general authorization for the release of medical or other information is NOT sufficient for this purpose.   The performance of this assay has not been clinically validated in patients less than 70 years old.        Musculoskeletal: Strength & Muscle Tone: within normal limits Gait & Station: normal Patient leans: N/A  Mental Status Examination;   Psychiatric Specialty Exam: Physical Exam  Constitutional: She appears well-developed and well-nourished. No distress.    Review of Systems  Constitutional: Negative.   Gastrointestinal: Negative for nausea.  Skin: Negative for rash.  Psychiatric/Behavioral: Negative for suicidal ideas and substance abuse.    Blood pressure 116/64, pulse 51, height 5\' 6"  (1.676 m), weight 169 lb (76.658 kg), SpO2 94 %.Body mass index is 27.29 kg/(m^2).  General Appearance: Casual  Eye Contact::  Fair  Speech:  Slow  Volume:  Normal  Mood:  Constricted and somewhat dysphoric  Affect:  Congruent  Thought Process:  Coherent  Orientation:  Full (Time, Place, and Person)  Thought Content:  Rumination  Suicidal Thoughts:  No  Homicidal  Thoughts:  No  Memory:  Immediate;   Fair Recent;   Fair  Judgement:  Fair  Insight:  Fair  Psychomotor Activity:  Normal  Concentration:  Fair  Recall:  Fair  Akathisia:  No  Handed:  Right  AIMS (if indicated):     Assets:  Communication Skills Desire for Improvement Physical Health Social Support  Sleep:        Assessment: Axis I: Major depressive 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 Panic disorder without agoraphobia. PTSD  Axis II: Deferred  Axis III:  Past  Medical History  Diagnosis Date  . Arthritis     hands, hips, knees, back feet  . Depression   . Lupus 2004  . PONV (postoperative nausea and vomiting)   . Syncope, cardiogenic 2002    treated with toprol  . Anxiety   . Heart murmur   . Chicken pox as a child  . Hypertension   . Tachycardia 06/29/2011  . Anxiety and depression 01/04/2011  . Headache disorder 05/16/2011  . Pain, dental 07/19/2011  . Pedal edema 08/14/2011  . Sinusitis acute 08/29/2011  . Knee pain, right 11/12/2011  . UTI (lower urinary tract infection) 11/27/2011  . Preventative health care 07/07/2012  . Poor dentition 06/22/2013    Axis IV: Psychosocial.    Treatment Plan and Summary: Continue Lexapro 10 mg for her PTSD and depression. DC topomax for her concern of increasing restlessness DC zyprexa. Already done slowly Add depakote as mood stabilizer  qhs. Continue Ativan for now for her anxiety.and panic attacks.  Slowly taper down once she continues to respond to lexapro.   Pertinent Labs and Relevant Prior Notes reviewed. Medication Side effects, benefits and risks reviewed/discussed with Patient. Time given for patient to respond and asks questions regarding the Diagnosis and Medications. Safety concerns and to report to ER if suicidal or call 911. Relevant Medications refilled or called in to pharmacy. Discussed  Sleep Hygiene. Follow up with Primary care provider in regards to Medical conditions. Recommend compliance with medications and follow up office appointments. Discussed to avail opportunity to consider or/and continue Individual therapy with Counselor. Greater than 50% of time was spend in counseling and coordination of care with the patient.  Schedule for Follow up visit in 4 weeks  or call in earlier as necessary.   Thresa Ross, MD 07/13/2014

## 2014-07-17 ENCOUNTER — Ambulatory Visit (HOSPITAL_COMMUNITY): Payer: Self-pay | Admitting: Licensed Clinical Social Worker

## 2014-07-21 ENCOUNTER — Ambulatory Visit (INDEPENDENT_AMBULATORY_CARE_PROVIDER_SITE_OTHER): Payer: BLUE CROSS/BLUE SHIELD | Admitting: Psychiatry

## 2014-07-21 ENCOUNTER — Encounter (HOSPITAL_COMMUNITY): Payer: Self-pay | Admitting: Psychiatry

## 2014-07-21 VITALS — BP 124/82 | HR 80 | Ht 66.0 in | Wt 168.0 lb

## 2014-07-21 DIAGNOSIS — F331 Major depressive disorder, recurrent, moderate: Secondary | ICD-10-CM | POA: Diagnosis not present

## 2014-07-21 DIAGNOSIS — F410 Panic disorder [episodic paroxysmal anxiety] without agoraphobia: Secondary | ICD-10-CM

## 2014-07-21 DIAGNOSIS — F4310 Post-traumatic stress disorder, unspecified: Secondary | ICD-10-CM | POA: Diagnosis not present

## 2014-07-21 DIAGNOSIS — F3341 Major depressive disorder, recurrent, in partial remission: Secondary | ICD-10-CM

## 2014-07-21 MED ORDER — ALPRAZOLAM 1 MG PO TABS: ORAL_TABLET | ORAL | Status: DC

## 2014-07-21 MED ORDER — ESCITALOPRAM OXALATE 20 MG PO TABS: 20.0000 mg | ORAL_TABLET | Freq: Every day | ORAL | Status: DC
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
## 2014-07-23 ENCOUNTER — Ambulatory Visit (HOSPITAL_COMMUNITY): Payer: Self-pay | Admitting: Licensed Clinical Social Worker

## 2014-07-27 ENCOUNTER — Encounter: Payer: Self-pay | Admitting: Nurse Practitioner

## 2014-07-27 ENCOUNTER — Ambulatory Visit (INDEPENDENT_AMBULATORY_CARE_PROVIDER_SITE_OTHER): Payer: BLUE CROSS/BLUE SHIELD | Admitting: Nurse Practitioner

## 2014-07-27 VITALS — BP 119/83 | HR 69 | Temp 98.3°F | Resp 18 | Ht 66.0 in | Wt 171.0 lb

## 2014-07-27 DIAGNOSIS — I10 Essential (primary) hypertension: Secondary | ICD-10-CM

## 2014-07-27 DIAGNOSIS — K625 Hemorrhage of anus and rectum: Secondary | ICD-10-CM | POA: Diagnosis not present

## 2014-07-27 DIAGNOSIS — R112 Nausea with vomiting, unspecified: Secondary | ICD-10-CM

## 2014-07-27 DIAGNOSIS — K219 Gastro-esophageal reflux disease without esophagitis: Secondary | ICD-10-CM

## 2014-07-27 DIAGNOSIS — R5383 Other fatigue: Secondary | ICD-10-CM | POA: Insufficient documentation

## 2014-07-27 DIAGNOSIS — M329 Systemic lupus erythematosus, unspecified: Secondary | ICD-10-CM

## 2014-07-27 DIAGNOSIS — R001 Bradycardia, unspecified: Secondary | ICD-10-CM | POA: Diagnosis not present

## 2014-07-27 MED ORDER — METOPROLOL SUCCINATE ER 50 MG PO TB24: 50.0000 mg | ORAL_TABLET | Freq: Two times a day (BID) | ORAL | Status: DC

## 2014-07-27 MED ORDER — LISINOPRIL 20 MG PO TABS: 10.0000 mg | ORAL_TABLET | Freq: Two times a day (BID) | ORAL | Status: DC

## 2014-07-27 NOTE — Progress Notes (Signed)
Subjective:     Rebecca Boyle is a 44 y.o. female presents w/extreme fatigue, nausea, vomting, anorexia, 4 episodes bright red blood in toilet & clots. Also reports episode of low HR-43. Felt weak & SOB. Onset 3 weeks.  Denies fever, abd pain, dyspepsia, diarrhea, chest pain. Has formed stool about every other day. Doesn't strain. Reports occasional ankle edema. Had episode of rectal bleeding about 18 mos ago-self-limiting. Pos fam Hx: 3 paternal uncles w/colon cancer. Pt takes 50 metoprolol tid for "cardiac mediated syncope" Last seen by Dr Rulon Abide yrs ago.  Psych meds adjusted 1 wk ago: tried depakote, stopped due to SE. Increased lexapro to 20 mg from 10 mg.  Pt has SLE. She has not seen rheum in over 1 yr. She is taking PLQ 200 mg qd. Labs 2 mos ago: C3 C4 nml, ESR low, ANA neg, no protein in urine, ALT & AST slightly elevated. Pt plans to re-establish with [facility].  The following portions of the patient's history were reviewed and updated as appropriate: allergies, current medications, past family history, past medical history, past social history, past surgical history and problem list.  Review of Systems Pertinent items are noted in HPI.    Objective:    BP 119/83 mmHg  Pulse 69  Temp(Src) 98.3 F (36.8 C) (Temporal)  Resp 18  Ht _0  (1.676 m)  Wt 171 lb (77.565 kg)  BMI 27.61 kg/m2  SpO2 99% BP 119/83 mmHg  Pulse 69  Temp(Src) 98.3 F (36.8 C) (Temporal)  Resp 18  Ht _1  (1.676 m)  Wt 171 lb (77.565 kg)  BMI 27.61 kg/m2  SpO2 99% General appearance: alert, cooperative, appears stated age and no distress Eyes: negative findings: lids and lashes normal, conjunctivae and sclerae normal, corneas clear and pupils equal, round, reactive to light and accomodation Neck: no adenopathy, no carotid bruit, supple, symmetrical, trachea midline and thyroid not enlarged, symmetric, no tenderness/mass/nodules Lungs: clear to auscultation bilaterally Heart:  regular rate and rhythm, S1, S2 normal, no murmur, click, rub or gallop Abdomen: normal findings: no masses palpable and no organomegaly and abnormal findings:  RUQ & RLQ tender Lymph nodes: Cervical, supraclavicular, and axillary nodes normal. Neurologic: Grossly normal    Assessment:Plan  1. Other fatigue DD: cardiac, blood loss, SLE exacerbation - EKG 12-Lead NSR rate 60. PR .20 - Ambulatory referral to Cardiology - CBC with Differential/Platelet - C3 and C4 - ANA w/Reflex - Sedimentation rate - Comprehensive metabolic panel  2. Bradycardia Decrease toprol to 50 mg bid from tid - EKG 12-Lead - Ambulatory referral to Cardiology  3. Nausea and vomiting, vomiting of unspecified type Elevated ALT, AST - Ambulatory referral to Gastroenterology  4. Rectal bleeding Hemoccult NEG - Ambulatory referral to Gastroenterology  5. Systemic lupus Please establish w/[facility] Rheum - CBC with Differential/Platelet - C3 and C4 - ANA w/Reflex - Sedimentation rate - Comprehensive metabolic panel  6. Essential hypertension - metoprolol succinate (TOPROL-XL) 50 MG 24 hr tablet; Take 1 tablet (50 mg total) by mouth 2 (two) times daily. Take with or immediately following a meal.  Dispense: 270 tablet; Refill: 0 - lisinopril (PRINIVIL,ZESTRIL) 20 MG tablet; Take 0.5 tablets (10 mg total) by mouth 2 (two) times daily.  Dispense: 90 tablet; Refill: 1  7. Gastroesophageal reflux disease without esophagitis nexium PRN  F/u PRN labs

## 2014-07-27 NOTE — Assessment & Plan Note (Signed)
Recent episode of bradycardia HR in 40's, felt weak. Decrease dose to bid & f/u w/cardiology.

## 2014-07-27 NOTE — Progress Notes (Signed)
Pre visit review using our clinic review tool, if applicable. No additional management support is needed unless otherwise documented below in the visit note. 

## 2014-07-27 NOTE — Assessment & Plan Note (Signed)
Stopped nexium, no return of symptoms Take nexium PRN

## 2014-07-27 NOTE — Patient Instructions (Addendum)
Decrease metoprolol to twice daily-breakfast & bedtime.  Please take B complex daily to get 1200 mcg or 1 mg B12 daily.  Please see cardiology & gastroenterology.  Please send us last eye exam report.   Please return for labs next month.  Please get established with rheumatology again.  Nice to see you!

## 2014-07-28 LAB — CBC WITH DIFFERENTIAL/PLATELET
Basophils Absolute: 0 10*3/uL (ref 0.0–0.1)
Basophils Relative: 0.9 % (ref 0.0–3.0)
EOS PCT: 1.9 % (ref 0.0–5.0)
Eosinophils Absolute: 0.1 10*3/uL (ref 0.0–0.7)
HCT: 39.8 % (ref 36.0–46.0)
HEMOGLOBIN: 13.3 g/dL (ref 12.0–15.0)
LYMPHS ABS: 2.6 10*3/uL (ref 0.7–4.0)
Lymphocytes Relative: 56.7 % — ABNORMAL HIGH (ref 12.0–46.0)
MCHC: 33.4 g/dL (ref 30.0–36.0)
MCV: 91.8 fl (ref 78.0–100.0)
MONOS PCT: 11.2 % (ref 3.0–12.0)
Monocytes Absolute: 0.5 10*3/uL (ref 0.1–1.0)
NEUTROS ABS: 1.3 10*3/uL — AB (ref 1.4–7.7)
Neutrophils Relative %: 29.3 % — ABNORMAL LOW (ref 43.0–77.0)
PLATELETS: 154 10*3/uL (ref 150.0–400.0)
RBC: 4.33 Mil/uL (ref 3.87–5.11)
RDW: 13.4 % (ref 11.5–15.5)
WBC: 4.5 10*3/uL (ref 4.0–10.5)

## 2014-07-28 LAB — COMPREHENSIVE METABOLIC PANEL
ALT: 66 U/L — ABNORMAL HIGH (ref 0–35)
AST: 42 U/L — ABNORMAL HIGH (ref 0–37)
Albumin: 4.2 g/dL (ref 3.5–5.2)
Alkaline Phosphatase: 77 U/L (ref 39–117)
BUN: 13 mg/dL (ref 6–23)
CHLORIDE: 105 meq/L (ref 96–112)
CO2: 29 mEq/L (ref 19–32)
Calcium: 9.2 mg/dL (ref 8.4–10.5)
Creatinine, Ser: 0.84 mg/dL (ref 0.40–1.20)
GFR: 77.85 mL/min (ref >60.00)
Glucose, Bld: 83 mg/dL (ref 70–99)
Potassium: 4.2 mEq/L (ref 3.5–5.1)
Sodium: 142 mEq/L (ref 135–145)
Total Bilirubin: 0.5 mg/dL (ref 0.2–1.2)
Total Protein: 6.8 g/dL (ref 6.0–8.3)

## 2014-07-28 LAB — SEDIMENTATION RATE: SED RATE: 17 mm/h (ref 0–22)

## 2014-07-28 LAB — C3 AND C4
C3 Complement: 133 mg/dL (ref 90–180)
C4 COMPLEMENT: 18 mg/dL (ref 10–40)

## 2014-07-28 LAB — ANA W/REFLEX: ANA: NEGATIVE

## 2014-07-29 ENCOUNTER — Telehealth: Payer: Self-pay | Admitting: Nurse Practitioner

## 2014-07-29 DIAGNOSIS — R748 Abnormal levels of other serum enzymes: Secondary | ICD-10-CM

## 2014-07-29 NOTE — Telephone Encounter (Signed)
LMOM for pt to CB.  

## 2014-07-29 NOTE — Telephone Encounter (Signed)
pls call pt: Advise Labs are normal with exception of liver enzymes-they are still elevated, but stable. They should be monitored again in 2 months.  Decrease plaquenil to once daily-100 mg once day.  Avoid tylenol (acetaminophen) products, alcoholic beverages.  Eat a plant rich diet and keep exercising. Get established with rheumatology.

## 2014-07-30 NOTE — Telephone Encounter (Signed)
Patient aware of results and instructions.  Voiced understanding.

## 2014-07-31 ENCOUNTER — Ambulatory Visit (HOSPITAL_COMMUNITY): Payer: Self-pay | Admitting: Licensed Clinical Social Worker

## 2014-08-05 ENCOUNTER — Encounter: Payer: Self-pay | Admitting: Nurse Practitioner

## 2014-08-07 ENCOUNTER — Ambulatory Visit (HOSPITAL_COMMUNITY): Payer: Self-pay | Admitting: Licensed Clinical Social Worker

## 2014-08-13 ENCOUNTER — Ambulatory Visit (INDEPENDENT_AMBULATORY_CARE_PROVIDER_SITE_OTHER): Payer: BLUE CROSS/BLUE SHIELD | Admitting: Cardiovascular Disease

## 2014-08-13 ENCOUNTER — Encounter: Payer: Self-pay | Admitting: Cardiovascular Disease

## 2014-08-13 VITALS — BP 102/70 | HR 53 | Ht 65.0 in | Wt 168.0 lb

## 2014-08-13 DIAGNOSIS — R0609 Other forms of dyspnea: Secondary | ICD-10-CM

## 2014-08-13 DIAGNOSIS — R001 Bradycardia, unspecified: Secondary | ICD-10-CM | POA: Diagnosis not present

## 2014-08-13 DIAGNOSIS — M329 Systemic lupus erythematosus, unspecified: Secondary | ICD-10-CM

## 2014-08-13 DIAGNOSIS — R55 Syncope and collapse: Secondary | ICD-10-CM

## 2014-08-13 DIAGNOSIS — R5383 Other fatigue: Secondary | ICD-10-CM

## 2014-08-13 DIAGNOSIS — I9589 Other hypotension: Secondary | ICD-10-CM

## 2014-08-13 DIAGNOSIS — I10 Essential (primary) hypertension: Secondary | ICD-10-CM

## 2014-08-13 MED ORDER — METOPROLOL SUCCINATE ER 25 MG PO TB24: 25.0000 mg | ORAL_TABLET | Freq: Two times a day (BID) | ORAL | Status: DC

## 2014-08-13 MED ORDER — LISINOPRIL 10 MG PO TABS: 10.0000 mg | ORAL_TABLET | Freq: Every day | ORAL | Status: DC

## 2014-08-13 NOTE — Patient Instructions (Signed)
   Decrease Lisinopril to  daily   Decrease Toprol XL to  twice a day    New printed scripts given on above today. Continue all other medications.   Follow up in  1 month.
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
## 2014-08-14 ENCOUNTER — Ambulatory Visit (HOSPITAL_COMMUNITY): Payer: Self-pay | Admitting: Psychiatry

## 2014-08-14 ENCOUNTER — Ambulatory Visit (HOSPITAL_COMMUNITY): Payer: Self-pay | Admitting: Licensed Clinical Social Worker

## 2014-08-18 ENCOUNTER — Ambulatory Visit (HOSPITAL_COMMUNITY): Payer: Self-pay | Admitting: Psychiatry

## 2014-08-19 ENCOUNTER — Encounter: Payer: Self-pay | Admitting: Nurse Practitioner

## 2014-08-19 ENCOUNTER — Ambulatory Visit (INDEPENDENT_AMBULATORY_CARE_PROVIDER_SITE_OTHER): Payer: BLUE CROSS/BLUE SHIELD | Admitting: Nurse Practitioner

## 2014-08-19 VITALS — BP 100/72 | HR 60 | Ht 65.5 in | Wt 168.4 lb

## 2014-08-19 DIAGNOSIS — K625 Hemorrhage of anus and rectum: Secondary | ICD-10-CM

## 2014-08-19 DIAGNOSIS — R7989 Other specified abnormal findings of blood chemistry: Secondary | ICD-10-CM | POA: Diagnosis not present

## 2014-08-19 DIAGNOSIS — R634 Abnormal weight loss: Secondary | ICD-10-CM | POA: Diagnosis not present

## 2014-08-19 DIAGNOSIS — K219 Gastro-esophageal reflux disease without esophagitis: Secondary | ICD-10-CM

## 2014-08-19 DIAGNOSIS — R945 Abnormal results of liver function studies: Secondary | ICD-10-CM

## 2014-08-19 MED ORDER — NA SULFATE-K SULFATE-MG SULF 17.5-3.13-1.6 GM/177ML PO SOLN: 1.0000 | Freq: Once | ORAL | Status: AC

## 2014-08-19 NOTE — Patient Instructions (Signed)
Take Miralax 17 grams in 8 oz of water daily.  You have been scheduled for an endoscopy and colonoscopy. Please follow the written instructions given to you at your visit today. Please pick up your prep supplies at the pharmacy within the next 1-3 days. [facility]. If you use inhalers (even only as needed), please bring them with you on the day of your procedure. Your physician has requested that you go to www.startemmi.com and enter the access code given to you at your visit today. This web site gives a general overview about your procedure. However, you should still follow specific instructions given to you by our office regarding your preparation for the procedure.

## 2014-08-20 ENCOUNTER — Encounter: Payer: Self-pay | Admitting: Nurse Practitioner

## 2014-08-20 DIAGNOSIS — R945 Abnormal results of liver function studies: Secondary | ICD-10-CM | POA: Insufficient documentation

## 2014-08-20 DIAGNOSIS — R7989 Other specified abnormal findings of blood chemistry: Secondary | ICD-10-CM | POA: Insufficient documentation

## 2014-08-20 DIAGNOSIS — R634 Abnormal weight loss: Secondary | ICD-10-CM | POA: Insufficient documentation

## 2014-08-20 NOTE — Progress Notes (Signed)
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 Patient has several gastrointestinal issues.  Rectal bleeding-  several month history of intermittent rectal bleeding, she also has intermittent sharp rectal pain. Patient suffers with chronic constipation. Stools are hard despite colace.   GERD - one-year history of reflux. She has been taking a PPI but PCP trying to wean her it or at least limit use. Currently taking PPI three-four times a week.   Weight loss - weight down 10 pounds over the last couple months. Her appetite is poor. Was mention of nausea and vomiting in PCPs note late June but patient denies nausea and vomiting at present.   Past Medical History  Diagnosis Date  . Arthritis     hands, hips, knees, back feet  . Depression   . Lupus 2004  . PONV (postoperative nausea and vomiting)   . Syncope, cardiogenic 2002    treated with toprol  . Anxiety   . Heart murmur   . Chicken pox as a child  . Hypertension   . Tachycardia 06/29/2011  . Anxiety and depression 01/04/2011  . Headache disorder 05/16/2011  . Pain, dental 07/19/2011  . Pedal edema 08/14/2011  . Sinusitis acute 08/29/2011  . Knee pain, right 11/12/2011  . UTI (lower urinary tract infection) 11/27/2011  . Preventative health care 07/07/2012  . Poor dentition 06/22/2013    Family History  Problem Relation Age of Onset  . Hyperlipidemia Mother   . Hypertension Mother   . Stroke Mother     X 5 mini  . Cancer Mother 34    cervical  . Heart disease Mother   . Bipolar disorder Mother   . Hypertension Father   . Heart disease Father     cad with stent  . Arthritis Father     2 blown discs  . Stroke Maternal Grandmother   . Lupus Maternal Grandmother   . Heart disease Paternal Grandfather   . Cancer Paternal Uncle     colon  . Cancer Paternal Uncle     colon  . Cancer Paternal Uncle     colon   History  Substance Use Topics  . Smoking  status: Current Some Day Smoker -- 0.25 packs/day for 10 years    Types: Cigarettes  . Smokeless tobacco: Never Used     Comment: smokes every other day 08/13/14  . Alcohol Use: No   Current Outpatient Prescriptions  Medication Sig Dispense Refill  . albuterol (PROVENTIL HFA;VENTOLIN HFA) 108 (90 BASE) MCG/ACT inhaler Inhale 2 puffs into the lungs every 4 (four) hours as needed for wheezing. For exercise induced asthma 3 Inhaler 0  . ALPRAZolam Duanne Moron) 1 MG tablet Fill on or after July 3rd, 2016 60 tablet 0  . Cholecalciferol (VITAMIN D3) 50000 UNITS CAPS Take 1 capsule by mouth every 7 (seven) days. 12 capsule 0  . diclofenac sodium (VOLTAREN) 1 % GEL Apply 4 g topically 4 (four) times daily as needed. 15 Tube 0  . escitalopram (LEXAPRO) 20 MG tablet Take 1 tablet (20 mg total) by mouth daily. 30 tablet 0  . esomeprazole (NEXIUM) 20 MG capsule Take 1T PO 3 to 4 times/week 90 capsule 0  . Estradiol 0.75 MG/1.25 GM (0.06%) topical gel Place 1.25 g onto the skin daily. 3 Bottle 0  . hydroxychloroquine (PLAQUENIL) 200 MG tablet Take 100 mg by mouth daily.    Marland Kitchen lisinopril (PRINIVIL,ZESTRIL) 10  MG tablet Take 1 tablet (10 mg total) by mouth daily. 30 tablet 6  . metoprolol succinate (TOPROL-XL) 25 MG 24 hr tablet Take 1 tablet (25 mg total) by mouth 2 (two) times daily. 30 tablet 6  . Multiple Vitamin (MULITIVITAMIN WITH MINERALS) TABS Take 1 tablet by mouth daily.    . Probiotic Product (ALIGN) 4 MG CAPS Take 1 capsule by mouth daily. 90 capsule 0  . SUMAtriptan (IMITREX) 50 MG tablet Take 1 tablet (50 mg total) by mouth every 2 (two) hours as needed for migraine or headache. May repeat in 2 hours if headache persists or recurs. 27 tablet 0  . Na Sulfate-K Sulfate-Mg Sulf SOLN Take 1 kit by mouth once. 354 mL 0  . [DISCONTINUED] topiramate (TOPAMAX) 25 MG tablet Take 2 tablets (50 mg total) by mouth 2 (two) times daily. 60 tablet 0   No current facility-administered medications for this visit.    Allergies  Allergen Reactions  . Morphine And Related Shortness Of Breath and Itching  . Tylenol [Acetaminophen] Rash    Pt states she has liver damage, and is also taking plaquenil  . Codeine Itching and Nausea And Vomiting     Review of Systems: Positive for anxiety, arthritis, shortness of breath and excessive thirst. All other systems reviewed and negative except where noted in HPI.   Physical Exam: BP 100/72 mmHg  Pulse 60  Ht 5' 5.5" (1.664 m)  Wt 168 lb 6.4 oz (76.386 kg)  BMI 27.59 kg/m2 Constitutional: Pleasant,well-developed, white female in no acute distress. HEENT: Normocephalic and atraumatic. Conjunctivae are normal. No scleral icterus. Neck supple.  Cardiovascular: Normal rate, regular rhythm.  Pulmonary/chest: Effort normal and breath sounds normal. No wheezing, rales or rhonchi. Abdominal: Soft, nondistended, nontender. Bowel sounds active throughout. There are no masses palpable. No hepatomegaly. Rectal : deferred Extremities: no edema Lymphadenopathy: No cervical adenopathy noted. Neurological: Alert and oriented to person place and time. Skin: Skin is warm and dry. No rashes noted. Psychiatric: Normal mood and affect. Behavior is normal.   ASSESSMENT AND PLAN:  38. 44 year old female with several month history of intermittent rectal bleeding. She also has intermittent sharp rectal pain which could just be spasms. Recent hemoglobin normal at 13 .  Of note, patient has threre paternal uncles with colon cancer. For further evaluation patient will be scheduled for colonoscopy.The risks, benefits, and alternatives to colonoscopy with possible biopsy and possible polypectomy were discussed with the patient and she consents to proceed.   2. GERD. PCP reduce dose to 3-4 times a week. If patient has right her symptoms she could raise add in Zantac 150 mg daily on days that she does not take PPI. If still has refractory GERD symptoms then she may need daily PPI.  3.  Weight loss, 10 pounds in 2 months per patient. Per Epic, down about 8 pounds. Etiology unclear. She was apparently having some nausea and vomiting a few weeks back but not now. For further evaluation patient will be scheduled for EGD to be done at time of colonoscopy. The benefits, risks, and potential complications of EGD with possible biopsies and/or dilation were discussed with the patient and she agrees to proceed.   4. Mild transaminitis. No liver abnormalities on CTscan Sept 2015. Could be med related. Further workup after resolution of acute issues.   4. Lupus, on plaquenil  5. Anxiety / depression. She is under care of psychiatry. Pleasant and appropriate patient.    Cc: Nicky Pugh, PA

## 2014-08-20 NOTE — Progress Notes (Signed)
Reviewed and agree with management plan.  Sierra Spargo T. Elisabel Hanover, MD FACG 

## 2014-08-20 NOTE — Progress Notes (Signed)
Reviewed and agree with management plan.  Ramsey Midgett T. Lavaun Greenfield, MD FACG 

## 2014-08-21 ENCOUNTER — Ambulatory Visit (HOSPITAL_COMMUNITY): Payer: Self-pay | Admitting: Licensed Clinical Social Worker

## 2014-08-26 ENCOUNTER — Telehealth: Payer: Self-pay | Admitting: *Deleted

## 2014-08-26 NOTE — Telephone Encounter (Signed)
Called the patient and LM advising I have a Suprep sample at the front desk for her, today 08-26-14.  I also Lm asking her to come this week for the sample due to having a limited supply.

## 2014-08-28 ENCOUNTER — Ambulatory Visit (HOSPITAL_COMMUNITY): Payer: Self-pay | Admitting: Licensed Clinical Social Worker

## 2014-09-16 ENCOUNTER — Telehealth: Payer: Self-pay | Admitting: *Deleted

## 2014-09-16 NOTE — Telephone Encounter (Signed)
Second call, Lm for the patient advising I still have the free Suprep at the front desk for her procedure on 09-25-2014.  I asked that she pick that up as soon as she can. I did advise that we don't have enough free prep for everyone so I asked again that she pick this up at our front desk.

## 2014-09-18 ENCOUNTER — Ambulatory Visit: Payer: Self-pay | Admitting: Cardiovascular Disease

## 2014-09-25 ENCOUNTER — Ambulatory Visit (AMBULATORY_SURGERY_CENTER): Payer: BLUE CROSS/BLUE SHIELD | Admitting: Gastroenterology

## 2014-09-25 ENCOUNTER — Encounter: Payer: Self-pay | Admitting: Gastroenterology

## 2014-09-25 VITALS — BP 121/86 | HR 52 | Temp 98.3°F | Resp 13 | Ht 65.0 in | Wt 168.0 lb

## 2014-09-25 DIAGNOSIS — R1314 Dysphagia, pharyngoesophageal phase: Secondary | ICD-10-CM

## 2014-09-25 DIAGNOSIS — K921 Melena: Secondary | ICD-10-CM | POA: Diagnosis present

## 2014-09-25 DIAGNOSIS — Z800 Family history of malignant neoplasm of digestive organs: Secondary | ICD-10-CM | POA: Diagnosis not present

## 2014-09-25 DIAGNOSIS — R634 Abnormal weight loss: Secondary | ICD-10-CM

## 2014-09-25 DIAGNOSIS — K219 Gastro-esophageal reflux disease without esophagitis: Secondary | ICD-10-CM | POA: Diagnosis not present

## 2014-09-25 MED ORDER — OMEPRAZOLE 20 MG PO CPDR
20.0000 mg | DELAYED_RELEASE_CAPSULE | Freq: Every day | ORAL | Status: DC
Start: 2014-09-25 — End: 2015-10-12

## 2014-09-25 MED ORDER — SODIUM CHLORIDE 0.9 % IV SOLN: 500.0000 mL | INTRAVENOUS | Status: DC

## 2014-09-25 NOTE — Op Note (Signed)
[facility] [address] [place], [address]   ENDOSCOPY PROCEDURE REPORT  PATIENT: Rebecca Boyle, Rebecca Boyle  MR#: [id number] BIRTHDATE: 1971/01/10 , 44  yrs. old GENDER: female ENDOSCOPIST: Meryl Dare, MD, [facility] REFERRED BY:  Cammie Mcgee, NP PROCEDURE DATE:  09/25/2014 PROCEDURE:  EGD w/ wire guided (savary) dilation ASA CLASS:     Class II INDICATIONS:  history of esophageal reflux, dysphagia, and weight loss. MEDICATIONS: Monitored anesthesia care, Residual sedation present, and Propofol 90 mg IV TOPICAL ANESTHETIC: none DESCRIPTION OF PROCEDURE: After the risks benefits and alternatives of the procedure were thoroughly explained, informed consent was obtained.  The [id number] endoscope was introduced through the mouth and advanced to the second portion of the duodenum , Without limitations.  The instrument was slowly withdrawn as the mucosa was fully examined.    EXAM: The esophagus and gastroesophageal junction were completely normal in appearance.  The stomach was entered and closely examined.The antrum, angularis, and lesser curvature were well visualized, including a retroflexed view of the cardia and fundus. The stomach wall was normally distensable.  The scope passed easily through the pylorus into the duodenum.   The stricture was dilated using a 16mm (48Fr) savary dilator over guidewire.  Retroflexed views revealed a hiatal hernia.     The scope was then withdrawn from the patient and the procedure completed.  COMPLICATIONS: There were no immediate complications.  ENDOSCOPIC IMPRESSION: 1.   Normal appearing esophagus and GE junction, the stomach was well visualized and normal in appearance, normal appearing duodenum  2.   The esophagus was dilated using a 16mm (48Fr) savary dilator over guidewire for dysphagia without a stricture  RECOMMENDATIONS: 1.  Anti-reflux regimen 2.  PPI qam: omeprazole 20 mg po qam, 1 year of  refills 3.  Post dilation instructions  eSigned:  Meryl Dare, MD, [facility] 09/25/2014 2:57 PM

## 2014-09-25 NOTE — Patient Instructions (Signed)
YOU HAD AN ENDOSCOPIC PROCEDURE TODAY AT THE [facility]:   Refer to the procedure report that was given to you for any specific questions about what was found during the examination.  If the procedure report does not answer your questions, please call your gastroenterologist to clarify.  If you requested that your care partner not be given the details of your procedure findings, then the procedure report has been included in a sealed envelope for you to review at your convenience later.  YOU SHOULD EXPECT: Some feelings of bloating in the abdomen. Passage of more gas than usual.  Walking can help get rid of the air that was put into your GI tract during the procedure and reduce the bloating. If you had a lower endoscopy (such as a colonoscopy or flexible sigmoidoscopy) you may notice spotting of blood in your stool or on the toilet paper. If you underwent a bowel prep for your procedure, you may not have a normal bowel movement for a few days.  Please Note:  You might notice some irritation and congestion in your nose or some drainage.  This is from the oxygen used during your procedure.  There is no need for concern and it should clear up in a day or so.  SYMPTOMS TO REPORT IMMEDIATELY:   Following lower endoscopy (colonoscopy or flexible sigmoidoscopy):  Excessive amounts of blood in the stool  Significant tenderness or worsening of abdominal pains  Swelling of the abdomen that is new, acute  Fever of 100F or higher   Following upper endoscopy (EGD)  Vomiting of blood or coffee ground material  New chest pain or pain under the shoulder blades  Painful or persistently difficult swallowing  New shortness of breath  Fever of 100F or higher  Black, tarry-looking stools  For urgent or emergent issues, a gastroenterologist can be reached at any hour by calling [phone].   DIET: NOTHING TO EAT OR DRINK UNTIL 4 PM 4 PM UNTIL 5 PM ONLY CLEAR LIQUIDS. 5 PM UNTIL MORNING  SOFT FOODS ONLY. RESUME YOUR REGULAR DIET IN THE AM.  ACTIVITY:  You should plan to take it easy for the rest of today and you should NOT DRIVE or use heavy machinery until tomorrow (because of the sedation medicines used during the test).    FOLLOW UP: Our staff will call the number listed on your records the next business day following your procedure to check on you and address any questions or concerns that you may have regarding the information given to you following your procedure. If we do not reach you, we will leave a message.  However, if you are feeling well and you are not experiencing any problems, there is no need to return our call.  We will assume that you have returned to your regular daily activities without incident.  If any biopsies were taken you will be contacted by phone or by letter within the next 1-3 weeks.  Please call us at [phone] if you have not heard about the biopsies in 3 weeks.    SIGNATURES/CONFIDENTIALITY: You and/or your care partner have signed paperwork which will be entered into your electronic medical record.  These signatures attest to the fact that that the information above on your After Visit Summary has been reviewed and is understood.  Full responsibility of the confidentiality of this discharge information lies with you and/or your care-partner.

## 2014-09-25 NOTE — Progress Notes (Signed)
Report to PACU, RN, vss, BBS= Clear.  

## 2014-09-25 NOTE — Op Note (Addendum)
[facility] [address] [place], [address]   COLONOSCOPY PROCEDURE REPORT  PATIENT: Rebecca Boyle, Rebecca Boyle  MR#: [id number] BIRTHDATE: 12/30/1970 , 44  yrs. old GENDER: female ENDOSCOPIST: Meryl Dare, MD, [facility] REFERRED XB:JYNWG Weaveer, FNP PROCEDURE DATE:  09/25/2014 PROCEDURE:   Colonoscopy, diagnostic First Screening Colonoscopy - Avg.  risk and is 50 yrs.  old or older - No.  Prior Negative Screening - Now for repeat screening. N/A  History of Adenoma - Now for follow-up colonoscopy & has been > or = to 3 yrs.  N/A  Polyps removed today? No Recommend repeat exam, <10 yrs? Yes high risk ASA CLASS:   Class II INDICATIONS:Evaluation of unexplained GI bleeding-hematochezia, loss of weight and FH Colon or Rectal Adenocarcinoma. MEDICATIONS: Monitored anesthesia care and Propofol 250 mg IV DESCRIPTION OF PROCEDURE:   After the risks benefits and alternatives of the procedure were thoroughly explained, informed consent was obtained.  The digital rectal exam revealed no abnormalities of the rectum.   The LB [id number]  endoscope was introduced through the anus and advanced to the cecum, which was identified by both the appendix and ileocecal valve. No adverse events experienced.   The quality of the prep was excellent. (Suprep was used)  The instrument was then slowly withdrawn as the colon was fully examined. Estimated blood loss is zero unless otherwise noted in this procedure report.    COLON FINDINGS: 4 mm angiodysplastic lesion was found in the descending colon. The examination was otherwise normal. Retroflexed views revealed internal Grade I hemorrhoids. The time to cecum = 2.1 Withdrawal time = 10.8   The scope was withdrawn and the procedure completed. COMPLICATIONS: There were no immediate complications.  ENDOSCOPIC IMPRESSION: 1.   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   Grade l internal hemorrhoids  RECOMMENDATIONS: 1.   Repeat Colonoscopy in 5 years.  eSigned:  Meryl Dare, MD, [facility] 09/25/2014 2:59 PM Revised: 09/25/2014 2:59 PM

## 2014-09-25 NOTE — Progress Notes (Signed)
Called to room to assist during endoscopic procedure.  Patient ID and intended procedure confirmed with present staff. Received instructions for my participation in the procedure from the performing physician.  

## 2014-09-28 ENCOUNTER — Telehealth: Payer: Self-pay

## 2014-09-28 NOTE — Telephone Encounter (Signed)
  Follow up Call-  Call back number 09/25/2014  Post procedure Call Back phone  # [phone]  Permission to leave phone message Yes     Patient questions:  Do you have a fever, pain , or abdominal swelling? No. Pain Score  0 *  Have you tolerated food without any problems? Yes.    Have you been able to return to your normal activities? Yes.    Do you have any questions about your discharge instructions: Diet   No. Medications  No. Follow up visit  No.  Do you have questions or concerns about your Care? No.  Actions: * If pain score is 4 or above: No action needed, pain <4.

## 2015-02-08 ENCOUNTER — Encounter: Payer: Self-pay | Admitting: Internal Medicine

## 2015-02-22 ENCOUNTER — Other Ambulatory Visit: Payer: Self-pay | Admitting: *Deleted

## 2015-02-22 DIAGNOSIS — I10 Essential (primary) hypertension: Secondary | ICD-10-CM

## 2015-02-22 MED ORDER — METOPROLOL SUCCINATE ER 25 MG PO TB24: 25.0000 mg | ORAL_TABLET | Freq: Two times a day (BID) | ORAL | Status: DC

## 2015-07-16 ENCOUNTER — Other Ambulatory Visit: Payer: Self-pay | Admitting: *Deleted

## 2015-07-16 DIAGNOSIS — I10 Essential (primary) hypertension: Secondary | ICD-10-CM

## 2015-07-16 MED ORDER — LISINOPRIL 10 MG PO TABS: 10.0000 mg | ORAL_TABLET | Freq: Every day | ORAL | Status: DC

## 2015-07-16 MED ORDER — METOPROLOL SUCCINATE ER 25 MG PO TB24: 25.0000 mg | ORAL_TABLET | Freq: Two times a day (BID) | ORAL | Status: DC

## 2015-08-04 ENCOUNTER — Ambulatory Visit: Payer: Self-pay | Admitting: Cardiovascular Disease

## 2015-08-05 ENCOUNTER — Encounter: Payer: Self-pay | Admitting: Cardiovascular Disease

## 2015-10-12 ENCOUNTER — Other Ambulatory Visit: Payer: Self-pay | Admitting: Gastroenterology

## 2015-10-12 DIAGNOSIS — K219 Gastro-esophageal reflux disease without esophagitis: Secondary | ICD-10-CM

## 2015-10-12 DIAGNOSIS — R1314 Dysphagia, pharyngoesophageal phase: Secondary | ICD-10-CM

## 2015-12-17 ENCOUNTER — Observation Stay (HOSPITAL_COMMUNITY)
Admission: EM | Admit: 2015-12-17 | Discharge: 2015-12-18 | Disposition: A | Discharge status: Home / Self Care | Payer: BLUE CROSS/BLUE SHIELD | Attending: Internal Medicine | Admitting: Internal Medicine

## 2015-12-17 ENCOUNTER — Encounter (HOSPITAL_COMMUNITY): Payer: Self-pay

## 2015-12-17 DIAGNOSIS — F32A Depression, unspecified: Secondary | ICD-10-CM | POA: Diagnosis present

## 2015-12-17 DIAGNOSIS — Z7982 Long term (current) use of aspirin: Secondary | ICD-10-CM | POA: Insufficient documentation

## 2015-12-17 DIAGNOSIS — M170 Bilateral primary osteoarthritis of knee: Secondary | ICD-10-CM | POA: Insufficient documentation

## 2015-12-17 DIAGNOSIS — R0602 Shortness of breath: Secondary | ICD-10-CM | POA: Insufficient documentation

## 2015-12-17 DIAGNOSIS — F418 Other specified anxiety disorders: Secondary | ICD-10-CM

## 2015-12-17 DIAGNOSIS — Z8049 Family history of malignant neoplasm of other genital organs: Secondary | ICD-10-CM | POA: Insufficient documentation

## 2015-12-17 DIAGNOSIS — M329 Systemic lupus erythematosus, unspecified: Secondary | ICD-10-CM | POA: Insufficient documentation

## 2015-12-17 DIAGNOSIS — F419 Anxiety disorder, unspecified: Secondary | ICD-10-CM | POA: Insufficient documentation

## 2015-12-17 DIAGNOSIS — R001 Bradycardia, unspecified: Secondary | ICD-10-CM | POA: Diagnosis not present

## 2015-12-17 DIAGNOSIS — M19042 Primary osteoarthritis, left hand: Secondary | ICD-10-CM | POA: Insufficient documentation

## 2015-12-17 DIAGNOSIS — I959 Hypotension, unspecified: Secondary | ICD-10-CM

## 2015-12-17 DIAGNOSIS — Y92098 Other place in other non-institutional residence as the place of occurrence of the external cause: Secondary | ICD-10-CM | POA: Insufficient documentation

## 2015-12-17 DIAGNOSIS — F329 Major depressive disorder, single episode, unspecified: Secondary | ICD-10-CM | POA: Insufficient documentation

## 2015-12-17 DIAGNOSIS — R079 Chest pain, unspecified: Secondary | ICD-10-CM | POA: Insufficient documentation

## 2015-12-17 DIAGNOSIS — I10 Essential (primary) hypertension: Secondary | ICD-10-CM | POA: Diagnosis present

## 2015-12-17 DIAGNOSIS — M19071 Primary osteoarthritis, right ankle and foot: Secondary | ICD-10-CM | POA: Insufficient documentation

## 2015-12-17 DIAGNOSIS — M160 Bilateral primary osteoarthritis of hip: Secondary | ICD-10-CM | POA: Insufficient documentation

## 2015-12-17 DIAGNOSIS — F17210 Nicotine dependence, cigarettes, uncomplicated: Secondary | ICD-10-CM | POA: Insufficient documentation

## 2015-12-17 DIAGNOSIS — Z885 Allergy status to narcotic agent status: Secondary | ICD-10-CM | POA: Insufficient documentation

## 2015-12-17 DIAGNOSIS — T461X1A Poisoning by calcium-channel blockers, accidental (unintentional), initial encounter: Principal | ICD-10-CM | POA: Insufficient documentation

## 2015-12-17 DIAGNOSIS — N179 Acute kidney failure, unspecified: Secondary | ICD-10-CM | POA: Insufficient documentation

## 2015-12-17 DIAGNOSIS — M19072 Primary osteoarthritis, left ankle and foot: Secondary | ICD-10-CM | POA: Insufficient documentation

## 2015-12-17 DIAGNOSIS — Z823 Family history of stroke: Secondary | ICD-10-CM | POA: Insufficient documentation

## 2015-12-17 DIAGNOSIS — Z886 Allergy status to analgesic agent status: Secondary | ICD-10-CM | POA: Insufficient documentation

## 2015-12-17 DIAGNOSIS — R112 Nausea with vomiting, unspecified: Secondary | ICD-10-CM | POA: Insufficient documentation

## 2015-12-17 DIAGNOSIS — M19041 Primary osteoarthritis, right hand: Secondary | ICD-10-CM | POA: Insufficient documentation

## 2015-12-17 DIAGNOSIS — Z800 Family history of malignant neoplasm of digestive organs: Secondary | ICD-10-CM | POA: Insufficient documentation

## 2015-12-17 DIAGNOSIS — Z9114 Patient's other noncompliance with medication regimen: Secondary | ICD-10-CM

## 2015-12-17 DIAGNOSIS — Z8249 Family history of ischemic heart disease and other diseases of the circulatory system: Secondary | ICD-10-CM | POA: Insufficient documentation

## 2015-12-17 DIAGNOSIS — Z634 Disappearance and death of family member: Secondary | ICD-10-CM | POA: Insufficient documentation

## 2015-12-17 LAB — COMPREHENSIVE METABOLIC PANEL
ALBUMIN: 4.3 g/dL (ref 3.5–5.0)
ALT: 89 U/L — AB (ref 14–54)
AST: 47 U/L — AB (ref 15–41)
Alkaline Phosphatase: 81 U/L (ref 38–126)
Anion gap: 6 (ref 5–15)
BUN: 15 mg/dL (ref 6–20)
CHLORIDE: 106 mmol/L (ref 101–111)
CO2: 27 mmol/L (ref 22–32)
CREATININE: 1.31 mg/dL — AB (ref 0.44–1.00)
Calcium: 8.9 mg/dL (ref 8.9–10.3)
GFR calc non Af Amer: 48 mL/min — ABNORMAL LOW (ref >60)
GFR, EST AFRICAN AMERICAN: 56 mL/min — AB (ref >60)
GLUCOSE: 92 mg/dL (ref 65–99)
Potassium: 4.3 mmol/L (ref 3.5–5.1)
SODIUM: 139 mmol/L (ref 135–145)
Total Bilirubin: 0.3 mg/dL (ref 0.3–1.2)
Total Protein: 7.2 g/dL (ref 6.5–8.1)

## 2015-12-17 LAB — CBC WITH DIFFERENTIAL/PLATELET
BASOS ABS: 0 10*3/uL (ref 0.0–0.1)
Basophils Relative: 0 %
Eosinophils Absolute: 0.1 10*3/uL (ref 0.0–0.7)
Eosinophils Relative: 1 %
HEMATOCRIT: 38.3 % (ref 36.0–46.0)
Hemoglobin: 12.6 g/dL (ref 12.0–15.0)
LYMPHS PCT: 49 %
Lymphs Abs: 4.6 10*3/uL — ABNORMAL HIGH (ref 0.7–4.0)
MCH: 31.6 pg (ref 26.0–34.0)
MCHC: 32.9 g/dL (ref 30.0–36.0)
MCV: 96 fL (ref 78.0–100.0)
MONO ABS: 1 10*3/uL (ref 0.1–1.0)
Monocytes Relative: 11 %
NEUTROS ABS: 3.7 10*3/uL (ref 1.7–7.7)
Neutrophils Relative %: 39 %
Platelets: 250 10*3/uL (ref 150–400)
RBC: 3.99 MIL/uL (ref 3.87–5.11)
RDW: 13 % (ref 11.5–15.5)
WBC: 9.4 10*3/uL (ref 4.0–10.5)

## 2015-12-17 LAB — ACETAMINOPHEN LEVEL

## 2015-12-17 LAB — ETHANOL: Alcohol, Ethyl (B): 7 mg/dL — ABNORMAL HIGH (ref <5)

## 2015-12-17 LAB — SALICYLATE LEVEL: Salicylate Lvl: <7 mg/dL (ref 2.8–30.0)

## 2015-12-17 MED ORDER — SODIUM CHLORIDE 0.9 % IV SOLN
1.0000 g | Freq: Once | INTRAVENOUS | Status: AC
  Administered 2015-12-17: 1 g via INTRAVENOUS
  Filled 2015-12-17: qty 10

## 2015-12-17 MED ORDER — SODIUM CHLORIDE 0.9 % IV BOLUS (SEPSIS)
1000.0000 mL | Freq: Once | INTRAVENOUS | Status: AC
  Administered 2015-12-17: 1000 mL via INTRAVENOUS

## 2015-12-17 MED ORDER — SODIUM CHLORIDE 0.9 % IV SOLN
Freq: Once | INTRAVENOUS | Status: AC
  Administered 2015-12-17: 23:00:00 via INTRAVENOUS

## 2015-12-17 NOTE — ED Provider Notes (Signed)
[facility]-EMERGENCY DEPT Provider Note   CSN: [id number] Arrival date & time: 12/17/15  2045  By signing my name below, I, Rebecca Boyle, attest that this documentation has been prepared under the direction and in the presence of Mancel Bale, MD. Electronically Signed: Morene Boyle, Scribe. 12/17/15. 10:51 PM.   History   Chief Complaint Chief Complaint  Patient presents with  . Drug Overdose    accidental    The history is provided by the patient, the EMS personnel and a relative. No language interpreter was used.   HPI Comments: Rebecca Boyle is a 45 y.o. female with h/o HTN on qd Metoprolol brought in by EMS who presents to the Emergency Department for evaluation of possible drug overdose tonight. EMS was called from home due to the pt "having a nervous breakdown". Per EMS, pt was vomiting, anxious, and crying on their arrival. EMS found the patient to be hypotensive and gave pt IV fluids with improvement. Per EMS, pt was noted to take 0.5 mg Xanax with her usual medications 30 minutes prior to their arrival. Sister reports that pt took a "left over" diltiazem for HA without checking her BP all day. Per sister, pts husband had massive MI at 4 AM this morning and died. Sister notes pt performed 17 minutes CPR prior to EMS arriving to take over. Sister reports that pt has h/o abusing Xanax and pain medication. Sister states she believes the pt may have taken pain medication along with Xanax and diltiazem this evening. Pt denies recent illness. No other known modifying factors.    Past Medical History:  Diagnosis Date  . Anxiety   . Anxiety and depression 01/04/2011  . Arthritis    hands, hips, knees, back feet  . Chicken pox as a child  . Depression   . Headache disorder 05/16/2011  . Heart murmur   . Hypertension   . Knee pain, right 11/12/2011  . Lupus 2004  . Pain, dental 07/19/2011  . Pedal edema 08/14/2011  . PONV (postoperative nausea and vomiting)   . Poor dentition 06/22/2013  .  Preventative health care 07/07/2012  . Sinusitis acute 08/29/2011  . Syncope, cardiogenic 2002   treated with toprol  . Tachycardia 06/29/2011  . UTI (lower urinary tract infection) 11/27/2011    Patient Active Problem List   Diagnosis Date Noted  . Loss of weight 08/20/2014  . Abnormal liver function test 08/20/2014  . Other fatigue 07/27/2014  . Bradycardia 07/27/2014  . Nausea with vomiting 07/27/2014  . Rectal bleeding 07/27/2014  . Gastroesophageal reflux disease without esophagitis 10/06/2013  . Poor dentition 06/22/2013  . Tobacco use disorder 07/07/2012  . Preventative health care 07/07/2012  . Pedal edema 08/14/2011  . Tachycardia 06/29/2011  . HTN (hypertension) 05/16/2011  . SLE (systemic lupus erythematosus) (HCC) 01/04/2011  . Anxiety and depression 01/04/2011  . Chronic pain 01/04/2011  . 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 ABDOMINAL HYSTERECTOMY  2010   had uterus left ovary removed 2010, right ovary removed 8/12  . BREAST SURGERY  1995   lumpectomy of left breast- benign  . CHOLECYSTECTOMY  2005  . right wrist nerve repair - 2008  2008   fell on nail, repair    OB History    No data available       Home Medications    Prior to Admission medications   Medication Sig Start Date End Date Taking?  Authorizing Provider  albuterol (PROVENTIL HFA;VENTOLIN HFA) 108 (90 BASE) MCG/ACT inhaler Inhale 2 puffs into the lungs every 4 (four) hours as needed for wheezing. For exercise induced asthma Patient not taking: Reported on 09/25/2014 10/02/13   Bradd CanaryStacey A Blyth, MD  ALPRAZolam Prudy Feeler(XANAX) 1 MG tablet Fill on or after July 3rd, 2016 07/21/14   Thresa RossNadeem Akhtar, MD  Cholecalciferol (VITAMIN D3) 50000 UNITS CAPS Take 1 capsule by mouth every 7 (seven) days. 05/22/14   Kelle DartingLayne C Weaver, NP  diclofenac sodium (VOLTAREN) 1 % GEL Apply 4 g topically 4 (four) times daily as needed. Patient not  taking: Reported on 09/25/2014 10/02/13   Bradd CanaryStacey A Blyth, MD  escitalopram (LEXAPRO) 20 MG tablet Take 1 tablet (20 mg total) by mouth daily. 07/21/14   Thresa RossNadeem Akhtar, MD  Estradiol 0.75 MG/1.25 GM (0.06%) topical gel Place 1.25 g onto the skin daily. 10/02/13   Bradd CanaryStacey A Blyth, MD  hydroxychloroquine (PLAQUENIL) 200 MG tablet Take 100 mg by mouth daily.    Historical Provider, MD  lisinopril (PRINIVIL,ZESTRIL) 10 MG tablet Take 1 tablet (10 mg total) by mouth daily. 07/16/15   [name], MD  metoprolol succinate (TOPROL-XL) 25 MG 24 hr tablet Take 1 tablet (25 mg total) by mouth 2 (two) times daily. 07/16/15   [name], MD  Multiple Vitamin (MULITIVITAMIN WITH MINERALS) TABS Take 1 tablet by mouth daily.    Historical Provider, MD  omeprazole (PRILOSEC) 20 MG capsule TAKE 1 CAPSULE (20 MG TOTAL) BY MOUTH DAILY. 10/12/15   [name], MD  Probiotic Product (ALIGN) 4 MG CAPS Take 1 capsule by mouth daily. 05/15/14   Kelle DartingLayne C Weaver, NP  SUMAtriptan (IMITREX) 50 MG tablet Take 1 tablet (50 mg total) by mouth every 2 (two) hours as needed for migraine or headache. May repeat in 2 hours if headache persists or recurs. Patient not taking: Reported on 09/25/2014 10/02/13   Bradd CanaryStacey A Blyth, MD    Family History Family History  Problem Relation Age of Onset  . Hyperlipidemia Mother   . Hypertension Mother   . Stroke Mother     X 5 mini  . Cancer Mother 145    cervical  . Heart disease Mother   . Bipolar disorder Mother   . Hypertension Father   . Heart disease Father     cad with stent  . Arthritis Father     2 blown discs  . Stroke Maternal Grandmother   . Lupus Maternal Grandmother   . Heart disease Paternal Grandfather   . Cancer Paternal Uncle     colon  . Cancer Paternal Uncle     colon  . Cancer Paternal Uncle     colon    Social History Social History  Substance Use Topics  . Smoking status: Current Some Day Smoker    Packs/day: 0.25    Years: 10.00    Types:  Cigarettes  . Smokeless tobacco: Never Used     Comment: smokes every other day 08/13/14  . Alcohol use No     Allergies   Morphine and related; Tylenol [acetaminophen]; and Codeine   Review of Systems Review of Systems  All other systems reviewed and are negative.   Physical Exam Updated Vital Signs BP 94/70 (BP Location: Right Arm)   Pulse (!) 43   Temp 98.4 F (36.9 C) (Oral)   Resp 17   Ht 5\' 5"  (1.651 m)   Wt 168 lb (76.2 kg)   SpO2  96%   BMI 27.96 kg/m   Physical Exam  Constitutional: She is oriented to person, place, and time. She appears well-developed and well-nourished.  HENT:  Head: Normocephalic and atraumatic.  Right Ear: External ear normal.  Left Ear: External ear normal.  Eyes: Conjunctivae and EOM are normal. Pupils are equal, round, and reactive to light.  Neck: Normal range of motion and phonation normal. Neck supple.  Cardiovascular: Regular rhythm and normal heart sounds.   Hypotensive. Bradycardic.  Pulmonary/Chest: Effort normal and breath sounds normal. She exhibits no bony tenderness.  Abdominal: Soft. There is no tenderness.  Musculoskeletal: Normal range of motion.  Neurological: She is alert and oriented to person, place, and time. No cranial nerve deficit or sensory deficit. She exhibits normal muscle tone. Coordination normal.  Dysarthric. Somewhat sleepy. No aphasia.  Skin: Skin is warm, dry and intact.  Psychiatric: She has a normal mood and affect. Her behavior is normal.  Nursing note and vitals reviewed.    ED Treatments / Results  DIAGNOSTIC STUDIES: Oxygen Saturation is 96% on RA, normal by my interpretation.    COORDINATION OF CARE: 9:09 PM Discussed treatment plan with pt at bedside and pt agreed to plan.   Labs (all labs ordered are listed, but only abnormal results are displayed) Labs Reviewed - No data to display  EKG  EKG Interpretation None       Radiology No results found.  Procedures Procedures  (including critical care time)  Medications Ordered in ED Medications - No data to display   Initial Impression / Assessment and Plan / ED Course  I have reviewed the triage vital signs and the nursing notes.  Pertinent labs & imaging results that were available during my care of the patient were reviewed by me and considered in my medical decision making (see chart for details).  Clinical Course as of Dec 16 2316  Caleen EssexFri Dec 17, 2015  2306 Discussions continued with patient, she is now alert, conversant, admits to taking diltiazem today. She describes taking a pill which can be identified, as diltiazem 180 mg, twice today at around 8 AM and 8 PM. She took this pill because she thought her blood pressure was high. Her sister is now here in-house to give information.  [EW]  2308 Calcium ordered for persistent bradycardia.  [EW]    Clinical Course User Index [EW] Mancel BaleElliott Arial Galligan, MD    Medications  calcium gluconate 1 g in sodium chloride 0.9 % 100 mL IVPB (1 g Intravenous New Bag/Given 12/17/15 2301)  sodium chloride 0.9 % bolus 1,000 mL (0 mLs Intravenous Stopped 12/17/15 2226)  sodium chloride 0.9 % bolus 1,000 mL (0 mLs Intravenous Stopped 12/17/15 2300)  0.9 %  sodium chloride infusion ( Intravenous New Bag/Given 12/17/15 2236)    Patient Vitals for the past 24 hrs:  BP Temp Temp src Pulse Resp SpO2 Height Weight  12/17/15 2245 98/70 - - (!) 45 13 99 % - -  12/17/15 2230 (!) 88/68 - - (!) 43 11 100 % - -  12/17/15 2200 (!) 86/64 - - (!) 39 11 98 % - -  12/17/15 2152 94/64 - - (!) 40 11 98 % - -  12/17/15 2130 (!) 82/59 - - - 16 - - -  12/17/15 2110 (!) 88/65 - - (!) 41 12 97 % - -  12/17/15 2100 (!) 81/58 - - - 13 - - -  12/17/15 2056 94/70 98.4 F (36.9 C) Oral (!) 43 17 96 %  5\' 5"  (1.651 m) 168 lb (76.2 kg)    11:06 PM Reevaluation with update and discussion. After initial assessment and treatment, an updated evaluation reveals She remains alert, calm, cooperative. Findings  discussed with patient and family members, all questions answered. Chavy Avera L   11:08 PM-Consult complete with hospitalist. Patient case explained and discussed. He agrees to admit patient for further evaluation and treatment. Call ended at 23:26  CRITICAL CARE Performed by: Flint Melter Total critical care time: 35 minutes Critical care time was exclusive of separately billable procedures and treating other patients. Critical care was necessary to treat or prevent imminent or life-threatening deterioration. Critical care was time spent personally by me on the following activities: development of treatment plan with patient and/or surrogate as well as nursing, discussions with consultants, evaluation of patient's response to treatment, examination of patient, obtaining history from patient or surrogate, ordering and performing treatments and interventions, ordering and review of laboratory studies, ordering and review of radiographic studies, pulse oximetry and re-evaluation of patient's condition.  Final Clinical Impressions(s) / ED Diagnoses   Final diagnoses:  Hypotension, unspecified hypotension type  Bradycardia  Situational anxiety  Noncompliance with medications    Accidental overdose on medications, inadvertently when she took extra medications which are not currently prescribed. She is also distraught and anxious over the death of her husband, following which she attempted to resuscitate him, earlier today. She will require admission for monitoring stabilization.   Nursing Notes Reviewed/ Care Coordinated, and agree without changes. Applicable Imaging Reviewed.  Interpretation of Laboratory Data incorporated into ED treatment  Plan: Admit  New Prescriptions New Prescriptions   No medications on file   I personally performed the services described in this documentation, which was scribed in my presence. The recorded information has been reviewed and is accurate.       Mancel Bale, MD 12/17/15 [phone]

## 2015-12-17 NOTE — ED Notes (Signed)
Per Sister: Pt's husband passed away suddenly this AM , pt performed CPR before ambulance arrived. Family came over to be with patient and patient said her head hurt and said she took an old dialtizem that was locked up and old from earlier. Per sister-all day she was groggy, but this evening she was slurred with her speech and took some additional medications-Her husband had chronic pain. Family reports she had two episodes of vomiting and then she was presyncopal, they were concerned so they called EMS.

## 2015-12-17 NOTE — ED Notes (Signed)
Dr. Effie ShyWentz informed of hypotension and bradycardia, NS fluid bouls ordered.

## 2015-12-17 NOTE — ED Notes (Signed)
Pt wanted ice or something to drink nurse advised not at this time advised I would use green swab to moisten mouth.

## 2015-12-17 NOTE — ED Triage Notes (Signed)
Pt's husband passed away this am and due to her stress today she accidentally took an extra diltiazem.

## 2015-12-17 NOTE — H&P (Addendum)
TRH H&P    Patient Demographics:    Rebecca Boyle, is a 45 y.o. female  MRN: [id number]  DOB - May 24, 1970  Admit Date - 12/17/2015  Referring MD/NP/PA: Dr. Effie Shy  Outpatient Primary MD for the patient is Kelle Darting, NP  Patient coming from: Home  Chief Complaint  Patient presents with  . Drug Overdose    accidental      HPI:    Rebecca Boyle  is a 45 y.o. female, With history of hypertension, lupus was brought to the ED via EMS after patient took extra dose of Cardizem for hypertension or. As patient her husband died a sudden cardiac death at 4 AM this morning and after that patient continue to have high blood pressure, she took 2 doses of metoprolol which is her usual dose and another dose of Cardizem 180 mg twice a day. Patient is not on Cardizem. Patient also has been having episodes of nausea and vomiting. She has been anxious and crying whole day. She also complains of intermittent chest pain and shortness of breath.  In the ED patient was found to be hypotensive along with bradycardia. Patient will be placed under observation overnight.    Review of systems:    In addition to the HPI above,  No Fever-chills, No Headache, No changes with Vision or hearing, No problems swallowing food or Liquids, No Blood in stool or Urine, No dysuria, No new skin rashes or bruises, No new joints pains-aches,  No new weakness, tingling, numbness in any extremity, No recent weight gain or loss, No polyuria, polydypsia or polyphagia, No significant Mental Stressors.  A full 10 point Review of Systems was done, except as stated above, all other Review of Systems were negative.   With Past History of the following :    Past Medical History:  Diagnosis Date  . Anxiety   . Anxiety and depression 01/04/2011  . Arthritis    hands, hips, knees, back feet  . Chicken pox as a child  . Depression     . Headache disorder 05/16/2011  . Heart murmur   . Hypertension   . Knee pain, right 11/12/2011  . Lupus 2004  . Pain, dental 07/19/2011  . Pedal edema 08/14/2011  . PONV (postoperative nausea and vomiting)   . Poor dentition 06/22/2013  . Preventative health care 07/07/2012  . Sinusitis acute 08/29/2011  . Syncope, cardiogenic 2002   treated with toprol  . Tachycardia 06/29/2011  . UTI (lower urinary tract infection) 11/27/2011      Past Surgical History:  Procedure Laterality Date  . ABDOMINAL HYSTERECTOMY  2010   had uterus left ovary removed 2010, right ovary removed 8/12  . BREAST SURGERY  1995   lumpectomy of left breast- benign  . CHOLECYSTECTOMY  2005  . right wrist nerve repair - 2008  2008   fell on nail, repair      Social History:      Social History  Substance Use Topics  . Smoking status: Current Some Day Smoker  Packs/day: 0.25    Years: 10.00    Types: Cigarettes  . Smokeless tobacco: Never Used     Comment: smokes every other day 08/13/14  . Alcohol use No       Family History :     Family History  Problem Relation Age of Onset  . Hyperlipidemia Mother   . Hypertension Mother   . Stroke Mother     X 5 mini  . Cancer Mother [id number]    cervical  . Heart disease Mother   . Bipolar disorder Mother   . Hypertension Father   . Heart disease Father     cad with stent  . Arthritis Father     2 blown discs  . Stroke Maternal Grandmother   . Lupus Maternal Grandmother   . Heart disease Paternal Grandfather   . Cancer Paternal Uncle     colon  . Cancer Paternal Uncle     colon  . Cancer Paternal Uncle     colon      Home Medications:   Prior to Admission medications   Medication Sig Start Date End Date Taking? Authorizing Provider  ALPRAZolam Prudy Feeler(XANAX) 1 MG tablet Fill on or after July 3rd, 2016 Patient taking differently: Take 1 mg by mouth 4 (four) times daily as needed for anxiety.  07/21/14  Yes Thresa RossNadeem Akhtar, MD  aspirin EC 81 MG tablet  Take 81 mg by mouth daily.   Yes Historical Provider, MD  diltiazem (DILACOR XR) 180 MG 24 hr capsule Take 360 mg by mouth once.   Yes Historical Provider, MD  escitalopram (LEXAPRO) 20 MG tablet Take 1 tablet (20 mg total) by mouth daily. 07/21/14  Yes Thresa RossNadeem Akhtar, MD  metoprolol succinate (TOPROL-XL) 50 MG 24 hr tablet TAKE 1 TABLET 2 TIMES A DAY 11/22/15  Yes Historical Provider, MD  Multiple Vitamin (MULITIVITAMIN WITH MINERALS) TABS Take 1 tablet by mouth daily.   Yes Historical Provider, MD  omeprazole (PRILOSEC) 20 MG capsule TAKE 1 CAPSULE (20 MG TOTAL) BY MOUTH DAILY. Patient taking differently: Take 20 mg by mouth daily as needed (for acid reflux).  10/12/15  Yes [name], MD  Probiotic Product (ALIGN) 4 MG CAPS Take 1 capsule by mouth daily. 05/15/14  Yes Kelle DartingLayne C Weaver, NP  SUMAtriptan (IMITREX) 50 MG tablet Take 1 tablet (50 mg total) by mouth every 2 (two) hours as needed for migraine or headache. May repeat in 2 hours if headache persists or recurs. 10/02/13  Yes Bradd CanaryStacey A Blyth, MD  albuterol (PROVENTIL HFA;VENTOLIN HFA) 108 (90 BASE) MCG/ACT inhaler Inhale 2 puffs into the lungs every 4 (four) hours as needed for wheezing. For exercise induced asthma Patient not taking: Reported on 09/25/2014 10/02/13   Bradd CanaryStacey A Blyth, MD     Allergies:     Allergies  Allergen Reactions  . Morphine And Related Shortness Of Breath and Itching  . Tylenol [Acetaminophen] Rash    Pt states she has liver damage, and is also taking plaquenil  . Codeine Itching and Nausea And Vomiting     Physical Exam:   Vitals  Blood pressure 94/69, pulse (!) 43, temperature 98.4 F (36.9 C), temperature source Oral, resp. rate 15, height 5\' 5"  (1.651 m), weight 76.2 kg (168 lb), SpO2 99 %.  1.  General: Appears in mild emotional distress  2. Psychiatric:  Intact judgement and  insight, awake alert, oriented x 3.  3. Neurologic: No focal neurological deficits, all cranial nerves intact.Strength 5/5  all 4 extremities, sensation intact all 4 extremities, plantars down going.  4. Eyes :  anicteric sclerae, moist conjunctivae with no lid lag. PERRLA.  5. ENMT:  Oropharynx clear with moist mucous membranes and good dentition  6. Neck:  supple, no cervical lymphadenopathy appriciated, No thyromegaly  7. Respiratory : Normal respiratory effort, good air movement bilaterally,clear to  auscultation bilaterally  8. Cardiovascular : RRR, no gallops, rubs or murmurs, no leg edema  9. Gastrointestinal:  Positive bowel sounds, abdomen soft, non-tender to palpation,no hepatosplenomegaly, no rigidity or guarding       10. Skin:  No cyanosis, normal texture and turgor, no rash, lesions or ulcers  11.Musculoskeletal:  Good muscle tone,  joints appear normal , no effusions,  normal range of motion    Data Review:    CBC  Recent Labs Lab 12/17/15 2120  WBC 9.4  HGB 12.6  HCT 38.3  PLT 250  MCV 96.0  MCH 31.6  MCHC 32.9  RDW 13.0  LYMPHSABS 4.6*  MONOABS 1.0  EOSABS 0.1  BASOSABS 0.0   ------------------------------------------------------------------------------------------------------------------  Chemistries   Recent Labs Lab 12/17/15 2120  NA 139  K 4.3  CL 106  CO2 27  GLUCOSE 92  BUN 15  CREATININE 1.31*  CALCIUM 8.9  AST 47*  ALT 89*  ALKPHOS 81  BILITOT 0.3   ------------------------------------------------------------------------------------------------------------------  ------------------------------------------------------------------------------------------------------------------ GFR: Estimated Creatinine Clearance: 54.8 mL/min (by C-G formula based on SCr of 1.31 mg/dL (H)). Liver Function Tests:  Recent Labs Lab 12/17/15 2120  AST 47*  ALT 89*  ALKPHOS 81  BILITOT 0.3  PROT 7.2  ALBUMIN 4.3    --------------------------------------------------------------------------------------------------------------- Urine analysis:      Component Value Date/Time   COLORURINE YELLOW 10/11/2013 2353   APPEARANCEUR CLOUDY (A) 10/11/2013 2353   LABSPEC 1.027 10/11/2013 2353   PHURINE 6.0 10/11/2013 2353   GLUCOSEU NEGATIVE 10/11/2013 2353   HGBUR NEGATIVE 10/11/2013 2353   BILIRUBINUR NEGATIVE 10/11/2013 2353   BILIRUBINUR small 11/27/2011 1215   KETONESUR NEGATIVE 10/11/2013 2353   PROTEINUR NEGATIVE 10/11/2013 2353   UROBILINOGEN 0.2 10/11/2013 2353   NITRITE NEGATIVE 10/11/2013 2353   LEUKOCYTESUR NEGATIVE 10/11/2013 2353      Imaging Results:    No results found.  My personal review of EKG: Rhythm - sinus bradycardia   Assessment & Plan:    Active Problems:   Bradycardia   1. Bradycardia- patient heart rate in 40s and 50s: She has not been symptomatic. Will observe in stepdown unit. 2. Chest pain- patient complains of intermittent chest pain, will obtain serial cardiac enzymes. 3. Acute kidney injury- patient's creatinine 1.31, her baseline is around 0.8. Likely from hypotension. We'll continue IV fluids. Follow BMP in a.m. 4. Hypotension- patient found to be hypotensive with SBP 90s, but hold metoprolol at this time. Continue IV fluids with normal saline. 5. Anxiety/depression-continue Xanax 1 mg 4 times a day when necessary   DVT Prophylaxis-   Lovenox   AM Labs Ordered, also please review Full Orders  Family Communication: Admission, patients condition and plan of care including tests being ordered have been discussed with the patient and Her daughter at bedside* who indicate understanding and agree with the plan and Code Status.  Code Status:  Full code  Admission status: Observation  Time spent in minutes : 60 minutes   Chao Blazejewski S M.D on 12/17/2015 at 11:48 PM  Between 7am to 7pm - Pager - [phone]. After 7pm go to [email or web address] - password [email or web address]  [facility] -  Office  [phone]

## 2015-12-17 NOTE — ED Triage Notes (Signed)
Pt's speech is very thick and slurry at times. Pt denies taking an excess of her oxycodone today, and states she has only taken her prescribed dose for her Lupus

## 2015-12-18 ENCOUNTER — Encounter (HOSPITAL_COMMUNITY): Payer: Self-pay

## 2015-12-18 DIAGNOSIS — R001 Bradycardia, unspecified: Secondary | ICD-10-CM | POA: Diagnosis not present

## 2015-12-18 LAB — COMPREHENSIVE METABOLIC PANEL
ALK PHOS: 66 U/L (ref 38–126)
ALT: 163 U/L — AB (ref 14–54)
ANION GAP: 5 (ref 5–15)
AST: 122 U/L — ABNORMAL HIGH (ref 15–41)
Albumin: 3.3 g/dL — ABNORMAL LOW (ref 3.5–5.0)
BUN: 14 mg/dL (ref 6–20)
CALCIUM: 8.1 mg/dL — AB (ref 8.9–10.3)
CO2: 21 mmol/L — ABNORMAL LOW (ref 22–32)
CREATININE: 1.02 mg/dL — AB (ref 0.44–1.00)
Chloride: 114 mmol/L — ABNORMAL HIGH (ref 101–111)
Glucose, Bld: 97 mg/dL (ref 65–99)
Potassium: 4.4 mmol/L (ref 3.5–5.1)
SODIUM: 140 mmol/L (ref 135–145)
TOTAL PROTEIN: 5.6 g/dL — AB (ref 6.5–8.1)
Total Bilirubin: 0.6 mg/dL (ref 0.3–1.2)

## 2015-12-18 LAB — CBC
HCT: 34.6 % — ABNORMAL LOW (ref 36.0–46.0)
HEMOGLOBIN: 10.9 g/dL — AB (ref 12.0–15.0)
MCH: 31.1 pg (ref 26.0–34.0)
MCHC: 31.5 g/dL (ref 30.0–36.0)
MCV: 98.6 fL (ref 78.0–100.0)
Platelets: 165 10*3/uL (ref 150–400)
RBC: 3.51 MIL/uL — AB (ref 3.87–5.11)
RDW: 13.6 % (ref 11.5–15.5)
WBC: 8.4 10*3/uL (ref 4.0–10.5)

## 2015-12-18 LAB — RAPID URINE DRUG SCREEN, HOSP PERFORMED
Amphetamines: NOT DETECTED
BENZODIAZEPINES: POSITIVE — AB
Barbiturates: NOT DETECTED
COCAINE: NOT DETECTED
Opiates: POSITIVE — AB
Tetrahydrocannabinol: POSITIVE — AB

## 2015-12-18 LAB — MRSA PCR SCREENING: MRSA BY PCR: NEGATIVE

## 2015-12-18 LAB — TSH: TSH: 0.894 u[IU]/mL (ref 0.350–4.500)

## 2015-12-18 LAB — TROPONIN I: Troponin I: 0.03 ng/mL (ref <0.03)

## 2015-12-18 MED ORDER — ONDANSETRON HCL 4 MG PO TABS: 4.0000 mg | ORAL_TABLET | Freq: Four times a day (QID) | ORAL | Status: DC | PRN

## 2015-12-18 MED ORDER — ALPRAZOLAM 0.5 MG PO TABS: 1.0000 mg | ORAL_TABLET | Freq: Four times a day (QID) | ORAL | Status: DC | PRN

## 2015-12-18 MED ORDER — ONDANSETRON HCL 4 MG/2ML IJ SOLN: 4.0000 mg | Freq: Four times a day (QID) | INTRAMUSCULAR | Status: DC | PRN

## 2015-12-18 MED ORDER — ALPRAZOLAM 0.5 MG PO TABS: 0.5000 mg | ORAL_TABLET | Freq: Four times a day (QID) | ORAL | Status: DC | PRN

## 2015-12-18 MED ORDER — SODIUM CHLORIDE 0.9 % IV SOLN
1.0000 g | Freq: Once | INTRAVENOUS | Status: AC
  Administered 2015-12-18: 1 g via INTRAVENOUS
  Filled 2015-12-18: qty 10

## 2015-12-18 MED ORDER — SODIUM CHLORIDE 0.9 % IV SOLN
INTRAVENOUS | Status: DC
  Administered 2015-12-18: 10:00:00 via INTRAVENOUS

## 2015-12-18 MED ORDER — ESCITALOPRAM OXALATE 10 MG PO TABS
20.0000 mg | ORAL_TABLET | Freq: Every day | ORAL | Status: DC
  Administered 2015-12-18: 20 mg via ORAL
  Filled 2015-12-18: qty 2

## 2015-12-18 MED ORDER — ASPIRIN EC 81 MG PO TBEC
81.0000 mg | DELAYED_RELEASE_TABLET | Freq: Every day | ORAL | Status: DC
  Administered 2015-12-18: 81 mg via ORAL
  Filled 2015-12-18: qty 1

## 2015-12-18 MED ORDER — PANTOPRAZOLE SODIUM 40 MG PO TBEC
40.0000 mg | DELAYED_RELEASE_TABLET | Freq: Every day | ORAL | Status: DC
  Administered 2015-12-18: 40 mg via ORAL
  Filled 2015-12-18: qty 1

## 2015-12-18 MED ORDER — ENOXAPARIN SODIUM 40 MG/0.4ML ~~LOC~~ SOLN
40.0000 mg | SUBCUTANEOUS | Status: DC
  Administered 2015-12-18: 40 mg via SUBCUTANEOUS
  Filled 2015-12-18: qty 0.4

## 2015-12-18 MED ORDER — SODIUM CHLORIDE 0.9 % IV SOLN
INTRAVENOUS | Status: DC
  Administered 2015-12-18: 500 mL via INTRAVENOUS
  Administered 2015-12-18: 04:00:00 via INTRAVENOUS

## 2015-12-18 MED ORDER — GLUCAGON HCL RDNA (DIAGNOSTIC) 1 MG IJ SOLR
5.0000 mg | Freq: Once | INTRAVENOUS | Status: AC
  Administered 2015-12-18: 5 mg via INTRAVENOUS
  Filled 2015-12-18 (×2): qty 5

## 2015-12-18 MED ORDER — SODIUM CHLORIDE 0.9 % IV BOLUS (SEPSIS): 500.0000 mL | Freq: Once | INTRAVENOUS | Status: AC

## 2015-12-18 MED ORDER — METOPROLOL SUCCINATE ER 50 MG PO TB24: 50.0000 mg | ORAL_TABLET | Freq: Every day | ORAL | 0 refills | Status: DC

## 2015-12-18 NOTE — Discharge Instructions (Signed)
Bradycardia, Adult Bradycardia is a slower-than-normal heartbeat. A normal resting heart rate for an adult ranges from 60 to 100 beats per minute. With bradycardia, the resting heart rate is less than 60 beats per minute. Bradycardia can prevent enough oxygen from reaching certain areas of your body when you are active. It can be serious if it keeps enough oxygen from reaching your brain and other parts of your body. Bradycardia is not a problem for everyone. For some healthy adults, a slow resting heart rate is normal. What are the causes? This condition may be caused by:  A problem with the heart, including:  A problem with the heart's electrical system, such as a heart block.  A problem with the heart's natural pacemaker (sinus node).  Heart disease.  A heart attack.  Heart damage.  A heart infection.  A heart condition that is present at birth (congenital heart defect).  Certain medicines that treat heart conditions.  Certain conditions, such as hypothyroidism and obstructive sleep apnea.  Problems with the balance of chemicals and other substances, like potassium, in the blood. What increases the risk? This condition is more likely to develop in adults who:  Are age 51 or older.  Have high blood pressure (hypertension), high cholesterol (hyperlipidemia), or diabetes.  Drink heavily, use tobacco or nicotine products, or use drugs.  Are stressed. What are the signs or symptoms? Symptoms of this condition include:  Light-headedness.  Feeling faint or fainting.  Fatigue and weakness.  Shortness of breath.  Chest pain (angina).  Drowsiness.  Confusion.  Dizziness. How is this diagnosed? This condition may be diagnosed based on:  Your symptoms.  Your medical history.  A physical exam. During the exam, your health care provider will listen to your heartbeat and check your pulse. To confirm the diagnosis, your health care provider may order tests, such  as:  Blood tests.  An electrocardiogram (ECG). This test records the heart's electrical activity. The test can show how fast your heart is beating and whether the heartbeat is steady.  A test in which you wear a portable device (event recorder or Holter monitor) to record your heart's electrical activity while you go about your day.  Anexercise test. How is this treated? Treatment for this condition depends on the cause of the condition and how severe your symptoms are. Treatment may involve:  Treatment of the underlying condition.  Changing your medicines or how much medicine you take.  Having a small, battery-operated device called a pacemaker implanted under the skin. When bradycardia occurs, this device can be used to increase your heart rate and help your heart to beat in a regular rhythm. Follow these instructions at home: Lifestyle  Manage any health conditions that contribute to bradycardia as told by your health care provider.  Follow a heart-healthy diet. A nutrition specialist (dietitian) can help to educate you about healthy food options and changes.  Follow an exercise program that is approved by your health care provider.  Maintain a healthy weight.  Try to reduce or manage your stress, such as with yoga or meditation. If you need help reducing stress, ask your health care provider.  Do not use use any products that contain nicotine or tobacco, such as cigarettes and e-cigarettes. If you need help quitting, ask your health care provider.  Do not use illegal drugs.  Limit alcohol intake to no more than 1 drink per day for nonpregnant women and 2 drinks per day for men. One drink equals  12 oz of beer, 5 oz of wine, or 1 oz of hard liquor. General instructions  Take over-the-counter and prescription medicines only as told by your health care provider.  Keep all follow-up visits as directed by your health care provider. This is important. How is this prevented? In  some cases, bradycardia may be prevented by:  Treating underlying medical problems.  Stopping behaviors or medicines that can trigger the condition. Contact a health care provider if:  You feel light-headed or dizzy.  You almost faint.  You feel weak or are easily fatigued during physical activity.  You experience confusion or have memory problems. Get help right away if:  You faint.  You have an irregular heartbeat (palpitations).  You have chest pain.  You have trouble breathing. This information is not intended to replace advice given to you by your health care provider. Make sure you discuss any questions you have with your health care provider. Document Released: 10/08/2001 Document Revised: 09/14/2015 Document Reviewed: 07/08/2015 Elsevier Interactive Patient Education  2017 [employer]   Hypotension Hypotension, commonly called low blood pressure, is when the force of blood pumping through your arteries is too weak. Arteries are blood vessels that carry blood from the heart throughout the body. When blood pressure is too low, you may not get enough blood to your brain or to the rest of your organs. This can cause weakness, light-headedness, rapid heartbeat, and fainting. Depending on the cause and severity, hypotension may be harmless (benign) or cause serious problems (critical). What are the causes? Possible causes of hypertension include:  Blood loss.  Loss of body fluids (dehydration).  Heart problems.  Hormone (endocrine) problems.  Pregnancy.  Severe infection.  Lack of certain nutrients.  Severe allergic reactions (anaphylaxis).  Certain medicines, such as blood pressure medicine or medicines that make the body lose excess fluids (diuretics). Sometimes, hypotension can be caused by not taking medicine as directed, such as taking too much of a certain medicine. What increases the risk? Certain factors can make you more likely to develop hypotension,  including:  Age. Risk increases as you get older.  Conditions that affect the heart or the central nervous system.  Taking certain medicines, such as blood pressure medicine or diuretics.  Being pregnant. What are the signs or symptoms? Symptoms of this condition may include:  Weakness.  Light-headedness.  Dizziness.  Blurred vision.  Fatigue.  Rapid heartbeat.  Fainting, in severe cases. How is this diagnosed? This condition is diagnosed based on:  Your medical history.  Your symptoms.  Your blood pressure measurement. Your health care provider will check your blood pressure when you are:  Lying down.  Sitting.  Standing. A blood pressure reading is recorded as two numbers, such as "120 over 80" (or 120/80). The first ("top") number is called the systolic pressure. It is a measure of the pressure in your arteries as your heart beats. The second ("bottom") number is called the diastolic pressure. It is a measure of the pressure in your arteries when your heart relaxes between beats. Blood pressure is measured in a unit called mm Hg. Healthy blood pressure for adults is 120/80. If your blood pressure is below 90/60, you may be diagnosed with hypotension. Other information or tests that may be used to diagnose hypotension include:  Your other vital signs, such as your heart rate and temperature.  Blood tests.  Tilt table test. For this test, you will be safely secured to a table that moves you from  a lying position to an upright position. Your heart rhythm and blood pressure will be monitored during the test. How is this treated? Treatment for this condition may include:  Changing your diet. This may involve eating more salt (sodium) or drinking more water.  Taking medicines to raise your blood pressure.  Changing the dosage of certain medicines you are taking that might be lowering your blood pressure.  Wearing compression stockings. These stockings help to  prevent blood clots and reduce swelling in your legs. In some cases, you may need to go to the hospital for:  Fluid replacement. This means you will receive fluids through an IV tube.  Blood replacement. This means you will receive donated blood through an IV tube (transfusion).  Treating an infection or heart problems, if this applies.  Monitoring. You may need to be monitored while medicines that you are taking wear off. Follow these instructions at home: Eating and drinking  Drink enough fluid to keep your urine clear or pale yellow.  Eat a healthy diet and follow instructions from your health care provider about eating or drinking restrictions. A healthy diet includes:  Fresh fruits and vegetables.  Whole grains.  Lean meats.  Low-fat dairy products.  Eat extra salt only as directed. Do not add extra salt to your diet unless your health care provider told you to do that.  Eat frequent, small meals.  Avoid standing up suddenly after eating. Medicines  Take over-the-counter and prescription medicines only as told by your health care provider.  Follow instructions from your health care provider about changing the dosage of your current medicines, if this applies.  Do not stop or adjust any of your medicines on your own. General instructions  Wear compression stockings as told by your health care provider.  Get up slowly from lying down or sitting positions. This gives your blood pressure a chance to adjust.  Avoid hot showers and excessive heat as directed by your health care provider.  Return to your normal activities as told by your health care provider. Ask your health care provider what activities are safe for you.  Do not use any products that contain nicotine or tobacco, such as cigarettes and e-cigarettes. If you need help quitting, ask your health care provider.  Keep all follow-up visits as told by your health care provider. This is important. Contact a  health care provider if:  You vomit.  You have diarrhea.  You have a fever for more than 2-3 days.  You feel more thirsty than usual.  You feel weak and tired. Get help right away if:  You have chest pain.  You have a fast or irregular heartbeat.  You develop numbness in any part of your body.  You cannot move your arms or your legs.  You have trouble speaking.  You become sweaty or feel light-headed.  You faint.  You feel short of breath.  You have trouble staying awake.  You feel confused. This information is not intended to replace advice given to you by your health care provider. Make sure you discuss any questions you have with your health care provider. Document Released: 01/16/2005 Document Revised: 08/06/2015 Document Reviewed: 07/08/2015 Elsevier Interactive Patient Education  2017 [employer] Follow with Primary MD Kelle Darting, NP in 2-3 days   Get CBC, CMP, 2 view Chest X ray checked  by Primary MD in 2-3 days ( we routinely change or add medications that can affect your baseline labs and fluid  status, therefore we recommend that you get the mentioned basic workup next visit with your PCP, your PCP may decide not to get them or add new tests based on their clinical decision)   Activity: As tolerated with Full fall precautions use walker/cane & assistance as needed   Disposition Home     Diet:   Heart Healthy   For Heart failure patients - Check your Weight same time everyday, if you gain over 2 pounds, or you develop in leg swelling, experience more shortness of breath or chest pain, call your Primary MD immediately. Follow Cardiac Low Salt Diet and 1.5 lit/day fluid restriction.   On your next visit with your primary care physician please Get Medicines reviewed and adjusted.   Please request your Prim.MD to go over all Hospital Tests and Procedure/Radiological results at the follow up, please get all Hospital records sent to your Prim MD by  signing hospital release before you go home.   If you experience worsening of your admission symptoms, develop shortness of breath, life threatening emergency, suicidal or homicidal thoughts you must seek medical attention immediately by calling 911 or calling your MD immediately  if symptoms less severe.  You Must read complete instructions/literature along with all the possible adverse reactions/side effects for all the Medicines you take and that have been prescribed to you. Take any new Medicines after you have completely understood and accpet all the possible adverse reactions/side effects.   Do not drive, operate heavy machinery, perform activities at heights, swimming or participation in water activities or provide baby sitting services if your were admitted for syncope or siezures until you have seen by Primary MD or a Neurologist and advised to do so again.  Do not drive when taking Pain medications.    Do not take more than prescribed Pain, Sleep and Anxiety Medications  Special Instructions: If you have smoked or chewed Tobacco  in the last 2 yrs please stop smoking, stop any regular Alcohol  and or any Recreational drug use.  Wear Seat belts while driving.   Please note  You were cared for by a hospitalist during your hospital stay. If you have any questions about your discharge medications or the care you received while you were in the hospital after you are discharged, you can call the unit and asked to speak with the hospitalist on call if the hospitalist that took care of you is not available. Once you are discharged, your primary care physician will handle any further medical issues. Please note that NO REFILLS for any discharge medications will be authorized once you are discharged, as it is imperative that you return to your primary care physician (or establish a relationship with a primary care physician if you do not have one) for your aftercare needs so that they can  reassess your need for medications and monitor your lab values.

## 2015-12-18 NOTE — Progress Notes (Signed)
Pt has been bradycardic in the low 30's to 40's. HR dropped as low as 25, BP's are remaining low as well SBP 70-90's. MD notified at 3:45am. Pt showing no signs of symptomatic bradycardia at this time. MD ordered NS 500 ml Bolus and rate change of IV fluids from 100 ml/hr to 125 ml/hr given around 0430.  HR and BP unchanged. MD notified a 2nd time about [id number]. RN to continue to monitor closely.  Cristie Hemiffani K Nasean Zapf, RN 12/18/15 5:15 AM

## 2015-12-18 NOTE — Discharge Summary (Signed)
Rebecca PicketStephanie H Boyle ZOX:[id number] DOB: 06/19/1970 DOA: 12/17/2015  PCP: Rebecca DartingLayne C Weaver, NP  Admit date: 12/17/2015  Discharge date: 12/18/2015  Admitted From: Home  Disposition:  Home   Recommendations for Outpatient Follow-up:   Follow up with PCP in 1-2 weeks  PCP Please obtain BMP/CBC, 2 view CXR in 1week,  (see Discharge instructions)   PCP Please follow up on the following pending results: None   Home Health: None   Equipment/Devices: None  Consultations: None Discharge Condition: Stable   CODE STATUS: Full   Diet Recommendation:  Heart Healthy     Chief Complaint  Patient presents with  . Drug Overdose    accidental     Brief history of present illness from the day of admission and additional interim summary    [name] a 45 y.o.female,With history of hypertension, lupus was brought to the ED via EMS after patient took extra dose of Cardizem for hypertension or. As patient her husband died a sudden cardiac death at 4 AM this morning and after that patient continue to have high blood pressure, she took 2 doses of metoprolol which is her usual dose and another dose of Cardizem 180 mg twice a day. Patient is not on Cardizem. Patient also has been having episodes of nausea and vomiting. She has been anxious and crying whole day. She also complains of intermittent chest pain and shortness of breath. In the ED patient was found to be hypotensive along with bradycardia. Patient will be placed under observation overnight.  Hospital issues addressed     1. Severe bradycardia and hypotension due to accidental Cardizem overdose, possibly beta blocker overdose as well. Received IV calcium glucagon with good effect, heart rate is coming up, blood pressure staying stable, excellent chronotropic  response to activity. Baseline TSH stable. Heart rate and blood pressure 1 hour after she woke up and sat in the chair, heart rate mid 50s systolic blood pressure 110 to 120s. She ambulating in the hallway completely symptom-free will be discharged home. She was advised to resume her beta blocker from tomorrow at home dose. Cardizem she took warts not hers it was her husband's which she took thinking her heart rate was fast, advised not to take it. She will follow with PCP tomorrow,  denies any suicidal or homicidal ideations.  2. Borderline troponin. Trend on ACS pattern. Chest pain-free. EKG nonacute. Follow-up with PCP Monday.  3. ARF due to hypotension from bradycardia. Resolved with hydration. Requested to skip lisinopril today.  4. Anxiety. When necessary Xanax.   Discharge diagnosis     Active Problems:   Anxiety and depression   HTN (hypertension)   Bradycardia    Discharge instructions    Discharge Instructions    Diet - low sodium heart healthy    Complete by:  As directed    Discharge instructions    Complete by:  As directed    Follow with Primary MD Rebecca DartingLayne C Weaver, NP in 2-3 days days   Get CBC, CMP, 2 view Chest  X ray checked  by Primary MD in 2-3 days ( we routinely change or add medications that can affect your baseline labs and fluid status, therefore we recommend that you get the mentioned basic workup next visit with your PCP, your PCP may decide not to get them or add new tests based on their clinical decision)   Activity: As tolerated with Full fall precautions use walker/cane & assistance as needed   Disposition Home     Diet:   Heart Healthy   For Heart failure patients - Check your Weight same time everyday, if you gain over 2 pounds, or you develop in leg swelling, experience more shortness of breath or chest pain, call your Primary MD immediately. Follow Cardiac Low Salt Diet and 1.5 lit/day fluid restriction.   On your next visit with your  primary care physician please Get Medicines reviewed and adjusted.   Please request your Prim.MD to go over all Hospital Tests and Procedure/Radiological results at the follow up, please get all Hospital records sent to your Prim MD by signing hospital release before you go home.   If you experience worsening of your admission symptoms, develop shortness of breath, life threatening emergency, suicidal or homicidal thoughts you must seek medical attention immediately by calling 911 or calling your MD immediately  if symptoms less severe.  You Must read complete instructions/literature along with all the possible adverse reactions/side effects for all the Medicines you take and that have been prescribed to you. Take any new Medicines after you have completely understood and accpet all the possible adverse reactions/side effects.   Do not drive, operate heavy machinery, perform activities at heights, swimming or participation in water activities or provide baby sitting services if your were admitted for syncope or siezures until you have seen by Primary MD or a Neurologist and advised to do so again.  Do not drive when taking Pain medications.    Do not take more than prescribed Pain, Sleep and Anxiety Medications  Special Instructions: If you have smoked or chewed Tobacco  in the last 2 yrs please stop smoking, stop any regular Alcohol  and or any Recreational drug use.  Wear Seat belts while driving.   Please note  You were cared for by a hospitalist during your hospital stay. If you have any questions about your discharge medications or the care you received while you were in the hospital after you are discharged, you can call the unit and asked to speak with the hospitalist on call if the hospitalist that took care of you is not available. Once you are discharged, your primary care physician will handle any further medical issues. Please note that NO REFILLS for any discharge medications  will be authorized once you are discharged, as it is imperative that you return to your primary care physician (or establish a relationship with a primary care physician if you do not have one) for your aftercare needs so that they can reassess your need for medications and monitor your lab values.   Increase activity slowly    Complete by:  As directed       Discharge Medications     Medication List    TAKE these medications   ALIGN 4 MG Caps Take 1 capsule by mouth daily.   ALPRAZolam 1 MG tablet Commonly known as:  Ollen Bowl on or after July 3rd, 2016 What changed:  how much to take  how to take this  when to take this  reasons to take this  additional instructions   aspirin EC 81 MG tablet Take 81 mg by mouth daily.   escitalopram 20 MG tablet Commonly known as:  LEXAPRO Take 1 tablet (20 mg total) by mouth daily.   lisinopril 10 MG tablet Commonly known as:  PRINIVIL,ZESTRIL TAKE 1 TABLET (10 MG TOTAL) BY MOUTH DAILY.   metoprolol succinate 50 MG 24 hr tablet Commonly known as:  TOPROL-XL Take 1 tablet (50 mg total) by mouth daily. Take with or immediately following a meal. Start taking on:  12/19/2015 What changed:  See the new instructions.   multivitamin with minerals Tabs tablet Take 1 tablet by mouth daily.   omeprazole 20 MG capsule Commonly known as:  PRILOSEC TAKE 1 CAPSULE (20 MG TOTAL) BY MOUTH DAILY. What changed:  when to take this  reasons to take this   SUMAtriptan 50 MG tablet Commonly known as:  IMITREX Take 1 tablet (50 mg total) by mouth every 2 (two) hours as needed for migraine or headache. May repeat in 2 hours if headache persists or recurs.       Follow-up Information    PCP. Schedule an appointment as soon as possible for a visit in 1 day(s).           Major procedures and Radiology Reports - PLEASE review detailed and final reports thoroughly  -        No results found.  Micro Results     Recent Results  (from the past 240 hour(s))  MRSA PCR Screening     Status: None   Collection Time: 12/18/15  1:08 AM  Result Value Ref Range Status   MRSA by PCR NEGATIVE NEGATIVE Final    Comment:        The GeneXpert MRSA Assay (FDA approved for NASAL specimens only), is one component of a comprehensive MRSA colonization surveillance program. It is not intended to diagnose MRSA infection nor to guide or monitor treatment for MRSA infections.     Today   Subjective    Kassia Demarinis today has no headache,no chest abdominal pain,no new weakness tingling or numbness, feels much better wants to go home today.     Objective   Blood pressure 109/72, pulse 55, temperature (!) 96.6 F (35.9 C), temperature source Oral, resp. rate 11, height 5\' 5"  (1.651 m), weight 80.9 kg (178 lb 5.6 oz), SpO2 96 %.   Intake/Output Summary (Last 24 hours) at 12/18/15 1214 Last data filed at 12/18/15 1000  Gross per 24 hour  Intake             2000 ml  Output              300 ml  Net             1700 ml    Exam Awake Alert, Oriented x 3, No new F.N deficits, Normal affect [place].AT,PERRAL Supple Neck,No JVD, No cervical lymphadenopathy appriciated.  Symmetrical Chest wall movement, Good air movement bilaterally, CTAB RRR,No Gallops,Rubs or new Murmurs, No Parasternal Heave +ve B.Sounds, Abd Soft, Non tender, No organomegaly appriciated, No rebound -guarding or rigidity. No Cyanosis, Clubbing or edema, No new Rash or bruise   Data Review   CBC w Diff: Lab Results  Component Value Date   WBC 8.4 12/18/2015   HGB 10.9 (L) 12/18/2015   HCT 34.6 (L) 12/18/2015   PLT 165 12/18/2015   LYMPHOPCT 49 12/17/2015   MONOPCT 11 12/17/2015   EOSPCT 1 12/17/2015  BASOPCT 0 12/17/2015    CMP: Lab Results  Component Value Date   NA 140 12/18/2015   K 4.4 12/18/2015   CL 114 (H) 12/18/2015   CO2 21 (L) 12/18/2015   BUN 14 12/18/2015   CREATININE 1.02 (H) 12/18/2015   CREATININE 0.77 08/22/2012   PROT  5.6 (L) 12/18/2015   ALBUMIN 3.3 (L) 12/18/2015   BILITOT 0.6 12/18/2015   ALKPHOS 66 12/18/2015   AST 122 (H) 12/18/2015   ALT 163 (H) 12/18/2015  . Lab Results  Component Value Date   TSH 0.894 12/18/2015     Total Time in preparing paper work, data evaluation and todays exam - 35 minutes  Leroy SeaSINGH,Micheale Schlack K M.D on 12/18/2015 at 12:14 PM  [facility]   Office  [phone]

## 2015-12-18 NOTE — Progress Notes (Signed)
PROGRESS NOTE                                                                                                                                                                                                             Patient Demographics:    Rebecca Boyle Dirocco, is a 45 y.o. female, DOB - 03/29/1970, JYN:[id number]  Admit date - 12/17/2015   Admitting Physician Meredeth IdeGagan S Lama, MD  Outpatient Primary MD for the patient is Kelle DartingLayne C Weaver, NP  LOS - 0  Chief Complaint  Patient presents with  . Drug Overdose    accidental       Brief Narrative  Rebecca Boyle [place]  is a 45 y.o. female, With history of hypertension, lupus was brought to the ED via EMS after patient took extra dose of Cardizem for hypertension or. As patient her husband died a sudden cardiac death at 4 AM this morning and after that patient continue to have high blood pressure, she took 2 doses of metoprolol which is her usual dose and another dose of Cardizem 180 mg twice a day. Patient is not on Cardizem. Patient also has been having episodes of nausea and vomiting. She has been anxious and crying whole day. She also complains of intermittent chest pain and shortness of breath. In the ED patient was found to be hypotensive along with bradycardia. Patient will be placed under observation overnight.   Subjective:    Rebecca Boyle Sandles today has, No headache, No chest pain, No abdominal pain - No Nausea, No new weakness tingling or numbness, No Cough - SOB.     Assessment  & Plan :      1. Severe bradycardia and hypotension due to accidental Cardizem overdose, possibly beta blocker overdose as well. Received IV calcium glucagon with good effect, heart rate is coming up, blood pressure staying stable, excellent: Atrophic response to activity. We will monitor another 24 hours once blood pressure heart rate sustaining well will discharge. Baseline TSH ordered. Trend  troponin.  2. Borderline troponin. Trend. Chest pain-free. EKG nonacute.  3. ARF due to hypotension from bradycardia. Resolved with hydration.  4. Anxiety. When necessary Xanax.    Family Communication  :  Daughter sleeping  Code Status :  Full  Diet : Diet Heart Room service appropriate? Yes; Fluid consistency: Thin   Disposition Plan  :  Stepdown  Consults  :  None  Procedures  :    DVT Prophylaxis  :  Lovenox   Lab Results  Component Value Date   PLT 165 12/18/2015    Inpatient Medications  Scheduled Meds: . aspirin EC  81 mg Oral Daily  . enoxaparin (LOVENOX) injection  40 mg Subcutaneous Q24H  . escitalopram  20 mg Oral Daily  . pantoprazole  40 mg Oral Daily   Continuous Infusions: . sodium chloride 75 mL/hr at 12/18/15 0956   PRN Meds:.ALPRAZolam, [DISCONTINUED] ondansetron **OR** ondansetron (ZOFRAN) IV  Antibiotics  :    Anti-infectives    None         Objective:   Vitals:   12/18/15 0900 12/18/15 0915 12/18/15 0930 12/18/15 1000  BP: 92/60 90/66 96/73  104/67  Pulse: (!) 43 (!) 45 (!) 39 (!) 45  Resp: 15 18 18 10   Temp:      TempSrc:      SpO2: 99% 99% 100% 96%  Weight:      Height:        Wt Readings from Last 3 Encounters:  12/18/15 80.9 kg (178 lb 5.6 oz)  09/25/14 76.2 kg (168 lb)  08/19/14 76.4 kg (168 lb 6.4 oz)     Intake/Output Summary (Last 24 hours) at 12/18/15 1035 Last data filed at 12/18/15 1000  Gross per 24 hour  Intake             2000 ml  Output              300 ml  Net             1700 ml     Physical Exam  Awake Alert, Oriented X 3, No new F.N deficits, Normal affect [place].AT,PERRAL Supple Neck,No JVD, No cervical lymphadenopathy appriciated.  Symmetrical Chest wall movement, Good air movement bilaterally, CTAB RRR,No Gallops,Rubs or new Murmurs, No Parasternal Heave +ve B.Sounds, Abd Soft, No tenderness, No organomegaly appriciated, No rebound - guarding or rigidity. No Cyanosis, Clubbing or edema, No  new Rash or bruise      Data Review:    CBC  Recent Labs Lab 12/17/15 2120 12/18/15 0548  WBC 9.4 8.4  HGB 12.6 10.9*  HCT 38.3 34.6*  PLT 250 165  MCV 96.0 98.6  MCH 31.6 31.1  MCHC 32.9 31.5  RDW 13.0 13.6  LYMPHSABS 4.6*  --   MONOABS 1.0  --   EOSABS 0.1  --   BASOSABS 0.0  --     Chemistries   Recent Labs Lab 12/17/15 2120 12/18/15 0548  NA 139 140  K 4.3 4.4  CL 106 114*  CO2 27 21*  GLUCOSE 92 97  BUN 15 14  CREATININE 1.31* 1.02*  CALCIUM 8.9 8.1*  AST 47* 122*  ALT 89* 163*  ALKPHOS 81 66  BILITOT 0.3 0.6   ------------------------------------------------------------------------------------------------------------------ No results for input(s): CHOL, HDL, LDLCALC, TRIG, CHOLHDL, LDLDIRECT in the last 72 hours.  Lab Results  Component Value Date   HGBA1C 5.8 05/15/2014   ------------------------------------------------------------------------------------------------------------------ No results for input(s): TSH, T4TOTAL, T3FREE, THYROIDAB in the last 72 hours.  Invalid input(s): FREET3 ------------------------------------------------------------------------------------------------------------------ No results for input(s): VITAMINB12, FOLATE, FERRITIN, TIBC, IRON, RETICCTPCT in the last 72 hours.  Coagulation profile No results for input(s): INR, PROTIME in the last 168 hours.  No results for input(s): DDIMER in the last 72 hours.  Cardiac Enzymes  Recent Labs Lab 12/18/15 0225 12/18/15 0839  TROPONINI <0.03 0.03*   ------------------------------------------------------------------------------------------------------------------  No results found for: BNP  Micro Results Recent Results (from the past 240 hour(s))  MRSA PCR Screening     Status: None   Collection Time: 12/18/15  1:08 AM  Result Value Ref Range Status   MRSA by PCR NEGATIVE NEGATIVE Final    Comment:        The GeneXpert MRSA Assay (FDA approved for NASAL  specimens only), is one component of a comprehensive MRSA colonization surveillance program. It is not intended to diagnose MRSA infection nor to guide or monitor treatment for MRSA infections.     Radiology Reports No results found.  Time Spent in minutes  30   Susa Raring K M.D on 12/18/2015 at 10:35 AM  Between 7am to 7pm - Pager - [phone]  After 7pm go to [email or web address] - password [facility]  [facility] -  Office  [phone]

## 2015-12-18 NOTE — Progress Notes (Signed)
Patient ambulated approximately 300 ft on unit. Did not experience and SOB or feeling of dizziness. O2 sat remained 95-100 during ambulation. HR remained 50-70 during ambulation. BP was 110/70 after ambulation. At rest patient's current HR is 52. Patient is up in chair and talking with family at bedside.   Only complaint from patient is that she has a severe headache.  Will continue to monitor patient.   Genelle Balameron D Lataunya Ruud, RN

## 2016-02-01 ENCOUNTER — Ambulatory Visit (INDEPENDENT_AMBULATORY_CARE_PROVIDER_SITE_OTHER): Payer: Self-pay | Admitting: Family Medicine

## 2016-02-01 ENCOUNTER — Encounter: Payer: Self-pay | Admitting: Family Medicine

## 2016-02-01 VITALS — BP 155/111 | HR 88 | Temp 98.3°F | Resp 20 | Wt 167.0 lb

## 2016-02-01 DIAGNOSIS — F4320 Adjustment disorder, unspecified: Secondary | ICD-10-CM

## 2016-02-01 DIAGNOSIS — G8929 Other chronic pain: Secondary | ICD-10-CM

## 2016-02-01 DIAGNOSIS — F418 Other specified anxiety disorders: Secondary | ICD-10-CM

## 2016-02-01 DIAGNOSIS — F4310 Post-traumatic stress disorder, unspecified: Secondary | ICD-10-CM | POA: Insufficient documentation

## 2016-02-01 DIAGNOSIS — F329 Major depressive disorder, single episode, unspecified: Secondary | ICD-10-CM

## 2016-02-01 DIAGNOSIS — I10 Essential (primary) hypertension: Secondary | ICD-10-CM

## 2016-02-01 DIAGNOSIS — F410 Panic disorder [episodic paroxysmal anxiety] without agoraphobia: Secondary | ICD-10-CM

## 2016-02-01 DIAGNOSIS — F419 Anxiety disorder, unspecified: Secondary | ICD-10-CM

## 2016-02-01 DIAGNOSIS — F32A Depression, unspecified: Secondary | ICD-10-CM

## 2016-02-01 DIAGNOSIS — M329 Systemic lupus erythematosus, unspecified: Secondary | ICD-10-CM

## 2016-02-01 DIAGNOSIS — J0100 Acute maxillary sinusitis, unspecified: Secondary | ICD-10-CM

## 2016-02-01 DIAGNOSIS — F4321 Adjustment disorder with depressed mood: Secondary | ICD-10-CM | POA: Insufficient documentation

## 2016-02-01 MED ORDER — DOXYCYCLINE HYCLATE 100 MG PO TABS: 100.0000 mg | ORAL_TABLET | Freq: Two times a day (BID) | ORAL | 0 refills | Status: DC

## 2016-02-01 MED ORDER — OLMESARTAN MEDOXOMIL 20 MG PO TABS: 20.0000 mg | ORAL_TABLET | Freq: Every day | ORAL | 0 refills | Status: DC
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
## 2016-02-01 NOTE — Patient Instructions (Addendum)
Doxycyline, mucinex, floanse for sinus infection.    [facility] [place]: [place] [phone]   I will refer back to cardiology and rheumatology.  Stop lisinopril. Start benicar 20 mg a day and f/u 3 days for repeat BP.     Please help [place] help you:  It is a privilege to be able to take care of great patients such as yourself. We are honored you have chosen [facility] for your Primary Care home. Below you will find basic instructions that you may need to access in the future. Please help [place] help you by reading the instructions, which cover many of the frequent questions we experience.   Prescription refills and request:  -In order to allow more efficient response time, please call your pharmacy for all refills. They will forward the request electronically to [place]. This allows for the quickest possible response. Request left on a nurse line can take longer to refill, since these are checked as time allows between office patients and other phone calls.  - refill request can take up to 3-5 working days to complete.  - If request is sent electronically and request is appropiate, it is usually completed in 1-2 business days.  - all patients will need to be seen routinely for all chronic medical conditions requiring prescription medications (see follow-up below). If you are overdue for follow up on your condition, you will be asked to make an appointment and we will call in enough medication to cover you until your appointment (up to 30 days).  - all controlled substances will require a face to face visit to request/refill.  - if you desire your prescriptions to go through a new pharmacy, and have an active script at original pharmacy, you will need to call your pharmacy and have scripts transferred to new pharmacy. This is completed between the pharmacy locations and not by your provider.    Results: If any images or labs were ordered, it can take up to 1 week to get results  depending on the test ordered and the lab/facility running and resulting the test. - Normal or stable results, which do not need further discussion, will be released to your mychart immediately with attached note to you. A call will not be generated for normal results. Please make certain to sign up for mychart. If you have questions on how to activate your mychart you can call the front office.  - If your results need further discussion, our office will attempt to contact you via phone, and if unable to reach you after 2 attempts, we will release your abnormal result to your mychart with instructions.  - All results will be automatically released in mychart after 1 week.  - Your provider will provide you with explanation and instruction on all relevant material in your results. Please keep in mind, results and labs may appear confusing or abnormal to the untrained eye, but it does not mean they are actually abnormal for you personally. If you have any questions about your results that are not covered, or you desire more detailed explanation than what was provided, you should make an appointment with your provider to do so.   Our office handles many outgoing and incoming calls daily. If we have not contacted you within 1 week about your results, please check your mychart to see if there is a message first and if not, then contact our office.  In helping with this matter, you help decrease call volume,  and therefore allow us to be able to respond to patients needs more efficiently.   Acute office visits (sick visit):  An acute visit is intended for a new problem and are scheduled in shorter time slots to allow schedule openings for patients with new problems. This is the appropriate visit to discuss a new problem. In order to provide you with excellent quality medical care with proper time for you to explain your problem, have an exam and receive treatment with instructions, these appointments should be  limited to one new problem per visit. If you experience a new problem, in which you desire to be addressed, please make an acute office visit, we save openings on the schedule to accommodate you. Please do not save your new problem for any other type of visit, let us take care of it properly and quickly for you.   Follow up visits:  Depending on your condition(s) your provider will need to see you routinely in order to provide you with quality care and prescribe medication(s). Most chronic conditions (Example: hypertension, Diabetes, depression/anxiety... etc), require visits a couple times a year. Your provider will instruct you on proper follow up for your personal medical conditions and history. Please make certain to make follow up appointments for your condition as instructed. Failing to do so could result in lapse in your medication treatment/refills. If you request a refill, and are overdue to be seen on a condition, we will always provide you with a 30 day script (once) to allow you time to schedule.    Medicare wellness (well visit): - we have a wonderful Nurse Selena Batten(Kim), that will meet with you and provide you will yearly medicare wellness visits. These visits should occur yearly (can not be scheduled less than 1 calendar year apart) and cover preventive health, immunizations, advance directives and screenings you are entitled to yearly through your medicare benefits. Do not miss out on your entitled benefits, this is when medicare will pay for these benefits to be ordered for you.  These are strongly encouraged by your provider and is the appropriate type of visit to make certain you are up to date with all preventive health benefits. If you have not had your medicare wellness exam in the last 12 months, please make certain to schedule one by calling the office and schedule your medicare wellness with Selena BattenKim as soon as possible.   Yearly physical (well visit):  - Adults are recommended to be seen  yearly for physicals. Check with your insurance and date of your last physical, most insurances require one calendar year between physicals. Physicals include all preventive health topics, screenings, medical exam and labs that are appropriate for gender/age and history. You may have fasting labs needed at this visit. This is a well visit (not a sick visit), acute topics should not be covered during this visit.  - Pediatric patients are seen more frequently when they are younger. Your provider will advise you on well child visit timing that is appropriate for your their age. - This is not a medicare wellness visit. Medicare wellness exams do not have an exam portion to the visit. Some medicare companies allow for a physical, some do not allow a yearly physical. If your medicare allows a yearly physical you can schedule the medicare wellness with our nurse Selena BattenKim and have your physical with your provider after, on the same day. Please check with insurance for your full benefits.   Late Policy/No Shows:  - all new patients  should arrive 15-30 minutes earlier than appointment to allow [place] time  to  obtain all personal demographics,  insurance information and for you to complete office paperwork. - All established patients should arrive 10-15 minutes earlier than appointment time to update all information and be checked in .  - In our best efforts to run on time, if you are late for your appointment you will be asked to either reschedule or if able, we will work you back into the schedule. There will be a wait time to work you back in the schedule,  depending on availability.  - If you are unable to make it to your appointment as scheduled, please call 24 hours ahead of time to allow [place] to fill the time slot with someone else who needs to be seen. If you do not cancel your appointment ahead of time, you may be charged a no show fee.

## 2016-02-02 MED ORDER — METOPROLOL SUCCINATE ER 50 MG PO TB24: 50.0000 mg | ORAL_TABLET | Freq: Every day | ORAL | 0 refills | Status: DC

## 2016-02-03 ENCOUNTER — Emergency Department (HOSPITAL_BASED_OUTPATIENT_CLINIC_OR_DEPARTMENT_OTHER): Payer: Self-pay

## 2016-02-03 ENCOUNTER — Encounter (HOSPITAL_BASED_OUTPATIENT_CLINIC_OR_DEPARTMENT_OTHER): Payer: Self-pay | Admitting: Emergency Medicine

## 2016-02-03 ENCOUNTER — Emergency Department (HOSPITAL_BASED_OUTPATIENT_CLINIC_OR_DEPARTMENT_OTHER)
Admission: EM | Admit: 2016-02-03 | Discharge: 2016-02-03 | Disposition: A | Discharge status: Home / Self Care | Payer: Self-pay | Attending: Emergency Medicine | Admitting: Emergency Medicine

## 2016-02-03 ENCOUNTER — Telehealth: Payer: Self-pay | Admitting: Family Medicine

## 2016-02-03 DIAGNOSIS — M25562 Pain in left knee: Secondary | ICD-10-CM | POA: Insufficient documentation

## 2016-02-03 DIAGNOSIS — M25552 Pain in left hip: Secondary | ICD-10-CM | POA: Insufficient documentation

## 2016-02-03 DIAGNOSIS — M25561 Pain in right knee: Secondary | ICD-10-CM | POA: Insufficient documentation

## 2016-02-03 DIAGNOSIS — Z79899 Other long term (current) drug therapy: Secondary | ICD-10-CM | POA: Insufficient documentation

## 2016-02-03 DIAGNOSIS — M2550 Pain in unspecified joint: Secondary | ICD-10-CM

## 2016-02-03 DIAGNOSIS — I10 Essential (primary) hypertension: Secondary | ICD-10-CM | POA: Insufficient documentation

## 2016-02-03 DIAGNOSIS — Z7982 Long term (current) use of aspirin: Secondary | ICD-10-CM | POA: Insufficient documentation

## 2016-02-03 DIAGNOSIS — M25551 Pain in right hip: Secondary | ICD-10-CM | POA: Insufficient documentation

## 2016-02-03 DIAGNOSIS — Z87891 Personal history of nicotine dependence: Secondary | ICD-10-CM | POA: Insufficient documentation

## 2016-02-03 LAB — URINALYSIS, ROUTINE W REFLEX MICROSCOPIC
BILIRUBIN URINE: NEGATIVE
GLUCOSE, UA: NEGATIVE mg/dL
KETONES UR: NEGATIVE mg/dL
NITRITE: NEGATIVE
PROTEIN: NEGATIVE mg/dL
Specific Gravity, Urine: 1.017 (ref 1.005–1.030)
pH: 5 (ref 5.0–8.0)

## 2016-02-03 LAB — CBC WITH DIFFERENTIAL/PLATELET
BASOS ABS: 0 10*3/uL (ref 0.0–0.1)
BASOS PCT: 0 %
EOS ABS: 0 10*3/uL (ref 0.0–0.7)
EOS PCT: 0 %
HEMATOCRIT: 39.2 % (ref 36.0–46.0)
Hemoglobin: 13.1 g/dL (ref 12.0–15.0)
Lymphocytes Relative: 31 %
Lymphs Abs: 2.8 10*3/uL (ref 0.7–4.0)
MCH: 31 pg (ref 26.0–34.0)
MCHC: 33.4 g/dL (ref 30.0–36.0)
MCV: 92.7 fL (ref 78.0–100.0)
MONO ABS: 0.7 10*3/uL (ref 0.1–1.0)
MONOS PCT: 8 %
NEUTROS ABS: 5.3 10*3/uL (ref 1.7–7.7)
Neutrophils Relative %: 61 %
PLATELETS: 239 10*3/uL (ref 150–400)
RBC: 4.23 MIL/uL (ref 3.87–5.11)
RDW: 13 % (ref 11.5–15.5)
WBC: 8.8 10*3/uL (ref 4.0–10.5)

## 2016-02-03 LAB — COMPREHENSIVE METABOLIC PANEL
ALK PHOS: 76 U/L (ref 38–126)
ALT: 68 U/L — AB (ref 14–54)
ANION GAP: 9 (ref 5–15)
AST: 40 U/L (ref 15–41)
Albumin: 4.5 g/dL (ref 3.5–5.0)
BILIRUBIN TOTAL: 0.5 mg/dL (ref 0.3–1.2)
BUN: 15 mg/dL (ref 6–20)
CALCIUM: 9.5 mg/dL (ref 8.9–10.3)
CO2: 22 mmol/L (ref 22–32)
CREATININE: 0.77 mg/dL (ref 0.44–1.00)
Chloride: 110 mmol/L (ref 101–111)
GFR calc non Af Amer: >60 mL/min (ref >60)
GLUCOSE: 120 mg/dL — AB (ref 65–99)
Potassium: 3.9 mmol/L (ref 3.5–5.1)
Sodium: 141 mmol/L (ref 135–145)
TOTAL PROTEIN: 7.6 g/dL (ref 6.5–8.1)

## 2016-02-03 LAB — URINALYSIS, MICROSCOPIC (REFLEX)

## 2016-02-03 LAB — TROPONIN I

## 2016-02-03 MED ORDER — PREDNISONE 20 MG PO TABS: 20.0000 mg | ORAL_TABLET | Freq: Every day | ORAL | 0 refills | Status: AC

## 2016-02-03 NOTE — Telephone Encounter (Signed)
Patient was requesting an appointment today, none available. [facility] would like for Dr. Alan RipperKuneff's CMA to [facility].

## 2016-02-03 NOTE — ED Notes (Signed)
ED Provider at bedside. 

## 2016-02-03 NOTE — ED Notes (Signed)
Patient transported to X-ray 

## 2016-02-03 NOTE — Discharge Instructions (Signed)

## 2016-02-03 NOTE — ED Notes (Signed)
Discussed resources with pt for follow-up regarding grief support. Pt directed to pharmacy to pick up prescriptions. Pt ambulatory with steady gait at discharge

## 2016-02-03 NOTE — ED Triage Notes (Signed)
Pt states she has been having high BP and stress since christmas after burring her husband.  Pt is anxious and fearful.  States she hurts all over.

## 2016-02-03 NOTE — Telephone Encounter (Signed)
Patient woke up out of a "dead sleep" early this morning her HR was 128 her bp was 162/109. It woke her up. Please call her.
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
## 2016-02-03 NOTE — Telephone Encounter (Signed)
Patient instructed to take medication, check BP 2 hours after. She stated that she took medication at 7:00 am and BP is 158/108. Patient was instructed to go to Urgent Care since there are no openings at our office today. Patient stated that she would go to urgent care.

## 2016-02-04 ENCOUNTER — Encounter: Payer: Self-pay | Admitting: Family Medicine

## 2016-02-04 ENCOUNTER — Ambulatory Visit (INDEPENDENT_AMBULATORY_CARE_PROVIDER_SITE_OTHER): Payer: Self-pay | Admitting: Family Medicine

## 2016-02-04 VITALS — BP 143/98 | HR 102 | Temp 98.2°F | Resp 16 | Wt 167.0 lb

## 2016-02-04 DIAGNOSIS — I10 Essential (primary) hypertension: Secondary | ICD-10-CM

## 2016-02-04 DIAGNOSIS — M329 Systemic lupus erythematosus, unspecified: Secondary | ICD-10-CM

## 2016-02-04 MED ORDER — LOSARTAN POTASSIUM 100 MG PO TABS: 100.0000 mg | ORAL_TABLET | Freq: Every day | ORAL | 5 refills | Status: DC

## 2016-02-04 MED ORDER — OXYCODONE HCL 5 MG PO TABS: 5.0000 mg | ORAL_TABLET | Freq: Two times a day (BID) | ORAL | 0 refills | Status: DC | PRN
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
## 2016-02-04 NOTE — Patient Instructions (Signed)
Losartan 100 mg daily, monitor BP and > 140/90 then call in to be seen.  #30 oxy IR prescribed today, low dose. Use sparingly and not with xanax. Use stool softener.   Will not refill narcotic, use sparingly.    Follow up in 3 months on Blood pressure.

## 2016-02-04 NOTE — Progress Notes (Signed)
Pre visit review using our clinic review tool, if applicable. No additional management support is needed unless otherwise documented below in the visit note. 

## 2016-03-03 ENCOUNTER — Other Ambulatory Visit: Payer: Self-pay | Admitting: Cardiovascular Disease

## 2016-03-06 ENCOUNTER — Encounter: Payer: Self-pay | Admitting: Family Medicine

## 2016-03-06 ENCOUNTER — Encounter (HOSPITAL_COMMUNITY): Payer: Self-pay | Admitting: Emergency Medicine

## 2016-03-06 ENCOUNTER — Ambulatory Visit (INDEPENDENT_AMBULATORY_CARE_PROVIDER_SITE_OTHER): Payer: Self-pay | Admitting: Family Medicine

## 2016-03-06 ENCOUNTER — Emergency Department (HOSPITAL_COMMUNITY)
Admission: EM | Admit: 2016-03-06 | Discharge: 2016-03-06 | Disposition: A | Discharge status: Home / Self Care | Payer: Self-pay | Attending: Emergency Medicine | Admitting: Emergency Medicine

## 2016-03-06 VITALS — BP 132/90 | HR 100 | Temp 98.0°F | Resp 18 | Wt 167.0 lb

## 2016-03-06 DIAGNOSIS — Z7982 Long term (current) use of aspirin: Secondary | ICD-10-CM | POA: Insufficient documentation

## 2016-03-06 DIAGNOSIS — Z87891 Personal history of nicotine dependence: Secondary | ICD-10-CM | POA: Insufficient documentation

## 2016-03-06 DIAGNOSIS — B9789 Other viral agents as the cause of diseases classified elsewhere: Secondary | ICD-10-CM

## 2016-03-06 DIAGNOSIS — I10 Essential (primary) hypertension: Secondary | ICD-10-CM | POA: Insufficient documentation

## 2016-03-06 DIAGNOSIS — Z79899 Other long term (current) drug therapy: Secondary | ICD-10-CM | POA: Insufficient documentation

## 2016-03-06 DIAGNOSIS — J069 Acute upper respiratory infection, unspecified: Secondary | ICD-10-CM

## 2016-03-06 MED ORDER — ALBUTEROL SULFATE (2.5 MG/3ML) 0.083% IN NEBU
2.5000 mg | INHALATION_SOLUTION | Freq: Once | RESPIRATORY_TRACT | Status: AC
  Administered 2016-03-06: 2.5 mg via RESPIRATORY_TRACT
  Filled 2016-03-06: qty 3

## 2016-03-06 MED ORDER — OXYMETAZOLINE HCL 0.05 % NA SOLN
2.0000 | Freq: Once | NASAL | Status: AC
Start: 2016-03-06 — End: 2016-03-06
  Administered 2016-03-06: 2 via NASAL
  Filled 2016-03-06: qty 15

## 2016-03-06 MED ORDER — HYDROCOD POLST-CPM POLST ER 10-8 MG/5ML PO SUER: 5.0000 mL | Freq: Two times a day (BID) | ORAL | 0 refills | Status: DC

## 2016-03-06 MED ORDER — DEXAMETHASONE SODIUM PHOSPHATE 4 MG/ML IJ SOLN
8.0000 mg | Freq: Once | INTRAMUSCULAR | Status: AC
  Administered 2016-03-06: 8 mg via INTRAMUSCULAR
  Filled 2016-03-06: qty 2

## 2016-03-06 MED ORDER — DOXYCYCLINE HYCLATE 100 MG PO TABS: 100.0000 mg | ORAL_TABLET | Freq: Two times a day (BID) | ORAL | 0 refills | Status: DC
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
## 2016-03-06 NOTE — ED Triage Notes (Signed)
Pt anxious and crying in traige

## 2016-03-06 NOTE — Progress Notes (Signed)
Pre visit review using our clinic review tool, if applicable. No additional management support is needed unless otherwise documented below in the visit note. 

## 2016-03-06 NOTE — ED Triage Notes (Signed)
Multiple symptoms, cough, fever, diarrhea, body aches

## 2016-03-06 NOTE — ED Triage Notes (Signed)
Pt saw PCP today, was told it was viral

## 2016-03-06 NOTE — Patient Instructions (Addendum)
Nasal saline. Flonase.  Doxycyline every 12 hours.  Use mucinex DM and tessalon Perles prescribed by other provider for cough.  Cough may take up to 6 weeks to resolve.     Cough, Adult Introduction A cough helps to clear your throat and lungs. A cough may last only 2-3 weeks (acute), or it may last longer than 8 weeks (chronic). Many different things can cause a cough. A cough may be a sign of an illness or another medical condition. Follow these instructions at home:  Pay attention to any changes in your cough.  Take medicines only as told by your doctor.  If you were prescribed an antibiotic medicine, take it as told by your doctor. Do not stop taking it even if you start to feel better.  Talk with your doctor before you try using a cough medicine.  Drink enough fluid to keep your pee (urine) clear or pale yellow.  If the air is dry, use a cold steam vaporizer or humidifier in your home.  Stay away from things that make you cough at work or at home.  If your cough is worse at night, try using extra pillows to raise your head up higher while you sleep.  Do not smoke, and try not to be around smoke. If you need help quitting, ask your doctor.  Do not have caffeine.  Do not drink alcohol.  Rest as needed. Contact a doctor if:  You have new problems (symptoms).  You cough up yellow fluid (pus).  Your cough does not get better after 2-3 weeks, or your cough gets worse.  Medicine does not help your cough and you are not sleeping well.  You have pain that gets worse or pain that is not helped with medicine.  You have a fever.  You are losing weight and you do not know why.  You have night sweats. Get help right away if:  You cough up blood.  You have trouble breathing.  Your heartbeat is very fast. This information is not intended to replace advice given to you by your health care provider. Make sure you discuss any questions you have with your health care  provider. Document Released: 09/29/2010 Document Revised: 06/24/2015 Document Reviewed: 03/25/2014  2017 Elsevier
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
## 2016-03-06 NOTE — ED Triage Notes (Addendum)
Wrong chart

## 2016-03-06 NOTE — Discharge Instructions (Signed)
Please increase fluids. Please wash hands frequently. Please use 2 puffs of albuterol every 4 hours. Use Afrin every 12 hours for 4 days only. Use Tussionex when needed for cough 2 times daily. You were treated in the emergency department with an injection of steroid medication. Please see Dr.Kuneff for additional evaluation and prescriptions if not improving.

## 2016-03-10 ENCOUNTER — Telehealth: Payer: Self-pay | Admitting: Family Medicine

## 2016-03-10 ENCOUNTER — Other Ambulatory Visit: Payer: Self-pay | Admitting: Family Medicine

## 2016-03-10 MED ORDER — METOPROLOL SUCCINATE ER 50 MG PO TB24: 50.0000 mg | ORAL_TABLET | Freq: Every day | ORAL | 5 refills | Status: DC

## 2016-03-10 NOTE — Progress Notes (Signed)
Metoprolol refilled.

## 2016-03-10 NOTE — Telephone Encounter (Signed)
Metoprolol refilled.

## 2016-03-10 NOTE — Telephone Encounter (Signed)
Verified pharmacy is attached. Patient is requesting that you fill metoprolol succinate (TOPROL-XL) 50 MG 24 hr tablet [[id number]][[id number]]  . She stated that you are aware of her situation and would be ok with filling this although you have not done it in the past

## 2016-03-11 ENCOUNTER — Emergency Department (HOSPITAL_COMMUNITY)
Admission: EM | Admit: 2016-03-11 | Discharge: 2016-03-12 | Disposition: A | Discharge status: Home / Self Care | Payer: Self-pay | Attending: Emergency Medicine | Admitting: Emergency Medicine

## 2016-03-11 ENCOUNTER — Encounter (HOSPITAL_COMMUNITY): Payer: Self-pay | Admitting: Emergency Medicine

## 2016-03-11 ENCOUNTER — Emergency Department (HOSPITAL_COMMUNITY): Payer: Self-pay

## 2016-03-11 DIAGNOSIS — R0789 Other chest pain: Secondary | ICD-10-CM

## 2016-03-11 DIAGNOSIS — J40 Bronchitis, not specified as acute or chronic: Secondary | ICD-10-CM

## 2016-03-11 DIAGNOSIS — Z79899 Other long term (current) drug therapy: Secondary | ICD-10-CM | POA: Insufficient documentation

## 2016-03-11 DIAGNOSIS — Z87891 Personal history of nicotine dependence: Secondary | ICD-10-CM | POA: Insufficient documentation

## 2016-03-11 DIAGNOSIS — I10 Essential (primary) hypertension: Secondary | ICD-10-CM | POA: Insufficient documentation

## 2016-03-11 HISTORY — DX: Syncope and collapse: R55

## 2016-03-11 LAB — CBC
HEMATOCRIT: 38.4 % (ref 36.0–46.0)
Hemoglobin: 13 g/dL (ref 12.0–15.0)
MCH: 31.8 pg (ref 26.0–34.0)
MCHC: 33.9 g/dL (ref 30.0–36.0)
MCV: 93.9 fL (ref 78.0–100.0)
PLATELETS: 248 10*3/uL (ref 150–400)
RBC: 4.09 MIL/uL (ref 3.87–5.11)
RDW: 13.4 % (ref 11.5–15.5)
WBC: 8.7 10*3/uL (ref 4.0–10.5)

## 2016-03-11 LAB — BASIC METABOLIC PANEL
Anion gap: 8 (ref 5–15)
BUN: 20 mg/dL (ref 6–20)
CHLORIDE: 109 mmol/L (ref 101–111)
CO2: 26 mmol/L (ref 22–32)
CREATININE: 0.69 mg/dL (ref 0.44–1.00)
Calcium: 9.2 mg/dL (ref 8.9–10.3)
Glucose, Bld: 114 mg/dL — ABNORMAL HIGH (ref 65–99)
POTASSIUM: 3.8 mmol/L (ref 3.5–5.1)
SODIUM: 143 mmol/L (ref 135–145)

## 2016-03-11 LAB — TROPONIN I: Troponin I: 0.03 ng/mL (ref <0.03)

## 2016-03-11 MED ORDER — KETOROLAC TROMETHAMINE 60 MG/2ML IM SOLN
60.0000 mg | Freq: Once | INTRAMUSCULAR | Status: AC
  Administered 2016-03-11: 60 mg via INTRAMUSCULAR
  Filled 2016-03-11: qty 2

## 2016-03-11 MED ORDER — IPRATROPIUM BROMIDE 0.02 % IN SOLN
0.5000 mg | Freq: Once | RESPIRATORY_TRACT | Status: AC
  Administered 2016-03-12: 0.5 mg via RESPIRATORY_TRACT
  Filled 2016-03-11: qty 2.5

## 2016-03-11 MED ORDER — DEXAMETHASONE SODIUM PHOSPHATE 10 MG/ML IJ SOLN
10.0000 mg | Freq: Once | INTRAMUSCULAR | Status: AC
  Administered 2016-03-11: 10 mg via INTRAMUSCULAR
  Filled 2016-03-11: qty 1

## 2016-03-11 MED ORDER — CYCLOBENZAPRINE HCL 10 MG PO TABS
10.0000 mg | ORAL_TABLET | Freq: Once | ORAL | Status: AC
  Administered 2016-03-11: 10 mg via ORAL
  Filled 2016-03-11: qty 1

## 2016-03-11 MED ORDER — ALBUTEROL SULFATE (2.5 MG/3ML) 0.083% IN NEBU
5.0000 mg | INHALATION_SOLUTION | Freq: Once | RESPIRATORY_TRACT | Status: AC
  Administered 2016-03-12: 5 mg via RESPIRATORY_TRACT
  Filled 2016-03-11: qty 6
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
## 2016-03-11 NOTE — ED Notes (Addendum)
Critical lab: Troponin 0.03 Provider notified face to face. Dr. Deretha EmoryZackowski

## 2016-03-11 NOTE — ED Triage Notes (Signed)
Patient c/o mid-sternal chest pain that started approx 35 minutes ago. Patient reports shortness of breath, dizziness, and nausea. Per patient had syncopal episode approx 45 seconds. Patient has hx of cardiaco medio syncope- patient takes meteprolol XL for and states that she ran out of medication 3 days ago. Patient also reports nonproductive cough and fevers x3 weeks with highest temp 104.3.
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
## 2016-03-12 LAB — TROPONIN I: TROPONIN I: 0.03 ng/mL — AB (ref <0.03)

## 2016-03-12 MED ORDER — CYCLOBENZAPRINE HCL 5 MG PO TABS: 5.0000 mg | ORAL_TABLET | Freq: Three times a day (TID) | ORAL | 0 refills | Status: DC | PRN

## 2016-03-12 MED ORDER — NAPROXEN 500 MG PO TABS: ORAL_TABLET | ORAL | 0 refills | Status: DC

## 2016-03-12 MED ORDER — AMOXICILLIN 500 MG PO CAPS: 500.0000 mg | ORAL_CAPSULE | Freq: Three times a day (TID) | ORAL | 0 refills | Status: DC

## 2016-03-12 MED ORDER — BENZONATATE 100 MG PO CAPS: ORAL_CAPSULE | ORAL | 0 refills | Status: DC

## 2016-03-12 MED ORDER — AZITHROMYCIN 250 MG PO TABS: 250.0000 mg | ORAL_TABLET | Freq: Every day | ORAL | 0 refills | Status: DC

## 2016-03-12 NOTE — Discharge Instructions (Signed)
Use ice and heat on your chest for comfort. Take the naproxen and flexeril for your chest wall pain. Continue using your inhaler, you can talk to your doctor about getting a nebulizer at home. Take the new antibiotics. Prednisone would probably help but you say you can't take it. Use the tessalon perles for cough.  Recheck with your doctor if not improving in the next couple of weeks.

## 2016-03-13 ENCOUNTER — Encounter: Payer: Self-pay | Admitting: Family Medicine

## 2016-03-13 ENCOUNTER — Other Ambulatory Visit: Payer: Self-pay | Admitting: *Deleted

## 2016-03-13 ENCOUNTER — Ambulatory Visit (INDEPENDENT_AMBULATORY_CARE_PROVIDER_SITE_OTHER): Payer: Self-pay | Admitting: Family Medicine

## 2016-03-13 VITALS — BP 133/84 | HR 90 | Temp 98.2°F | Resp 20 | Wt 167.0 lb

## 2016-03-13 DIAGNOSIS — R05 Cough: Secondary | ICD-10-CM

## 2016-03-13 DIAGNOSIS — G4700 Insomnia, unspecified: Secondary | ICD-10-CM | POA: Insufficient documentation

## 2016-03-13 DIAGNOSIS — R059 Cough, unspecified: Secondary | ICD-10-CM

## 2016-03-13 MED ORDER — METOPROLOL SUCCINATE ER 50 MG PO TB24: 50.0000 mg | ORAL_TABLET | Freq: Every day | ORAL | 5 refills | Status: DC

## 2016-03-13 MED ORDER — AMLODIPINE BESYLATE 5 MG PO TABS: 5.0000 mg | ORAL_TABLET | Freq: Every day | ORAL | 5 refills | Status: DC

## 2016-03-13 MED ORDER — TRAZODONE HCL 50 MG PO TABS: 50.0000 mg | ORAL_TABLET | Freq: Every evening | ORAL | 3 refills | Status: DC | PRN

## 2016-03-13 MED ORDER — PREDNISONE 50 MG PO TABS: 50.0000 mg | ORAL_TABLET | Freq: Every day | ORAL | 0 refills | Status: DC
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
## 2016-03-13 NOTE — Telephone Encounter (Signed)
Patient called to advise that she ended up in the ED this weekend bc she did not know that her medication was refilled. She is asking that you call back in to make sure [facility] got the script because they have not contacted her.

## 2016-03-13 NOTE — Telephone Encounter (Signed)
Patient has appt today.

## 2016-03-13 NOTE — Patient Instructions (Addendum)
Stop losartan. Start amlodipine 5 mg QD and monitor BP. If still above 150/90 at home, would want to increase amlodipine.   Start steroid taper for 5 days.  Drink plenty of fluids.  Start zyrtec nightly. Use mucinex DM nightly.  Normal nasal  saline flushes and humidifier at night.  Start trazodone 50-100 mg a night.  You can take Z-pack

## 2016-03-16 ENCOUNTER — Telehealth: Payer: Self-pay | Admitting: Family Medicine

## 2016-03-16 DIAGNOSIS — R05 Cough: Secondary | ICD-10-CM

## 2016-03-16 DIAGNOSIS — R053 Chronic cough: Secondary | ICD-10-CM

## 2016-03-16 NOTE — Telephone Encounter (Signed)
Patient is not feeling any better after starting medication.   She says her cough has gotten worse and would like to speak with someone to go over medications and her worsening symptoms.  Thank you,  -LL

## 2016-03-17 NOTE — Telephone Encounter (Signed)
I am referring her to pulmonology. She should also start OTC Prilosec daily to rule out GERD type cough.

## 2016-03-17 NOTE — Telephone Encounter (Signed)
Left message with referral information and detailed instructions [facility]

## 2016-03-17 NOTE — Addendum Note (Signed)
Addended by: Felix PaciniKUNEFF, Aleisa Howk A on: 03/17/2016 11:55 AM   Modules accepted: Orders

## 2016-03-21 ENCOUNTER — Encounter: Payer: Self-pay | Admitting: Family Medicine

## 2016-03-27 ENCOUNTER — Encounter: Payer: Self-pay | Admitting: Family Medicine

## 2016-03-27 ENCOUNTER — Ambulatory Visit (INDEPENDENT_AMBULATORY_CARE_PROVIDER_SITE_OTHER): Payer: Self-pay | Admitting: Family Medicine

## 2016-03-27 VITALS — BP 141/89 | HR 110 | Temp 98.0°F | Resp 20 | Wt 167.0 lb

## 2016-03-27 DIAGNOSIS — R053 Chronic cough: Secondary | ICD-10-CM

## 2016-03-27 DIAGNOSIS — J40 Bronchitis, not specified as acute or chronic: Secondary | ICD-10-CM

## 2016-03-27 DIAGNOSIS — R05 Cough: Secondary | ICD-10-CM

## 2016-03-27 MED ORDER — DOXYCYCLINE HYCLATE 100 MG PO TABS: 100.0000 mg | ORAL_TABLET | Freq: Two times a day (BID) | ORAL | 0 refills | Status: DC

## 2016-03-27 MED ORDER — MONTELUKAST SODIUM 10 MG PO TABS: 10.0000 mg | ORAL_TABLET | Freq: Every day | ORAL | 3 refills | Status: DC

## 2016-03-27 MED ORDER — OMEPRAZOLE 40 MG PO CPDR: 40.0000 mg | DELAYED_RELEASE_CAPSULE | Freq: Every day | ORAL | 3 refills | Status: DC

## 2016-03-27 MED ORDER — FLUTICASONE PROPIONATE 50 MCG/ACT NA SUSP: 2.0000 | Freq: Every day | NASAL | 6 refills | Status: DC
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
## 2016-03-27 NOTE — Patient Instructions (Signed)
GERD: Increased Prilosec 40 mg a day Allergies/cough: Singulair start, continue zyrtec daily and flonase Continue Mucinex DM, cough Supressant OTC and throat lozenges.  Prescribed doxycyline for potential acute illness.

## 2016-03-30 ENCOUNTER — Telehealth: Payer: Self-pay | Admitting: Family Medicine

## 2016-03-30 NOTE — Telephone Encounter (Signed)
Dr. Kuneff pt.  

## 2016-03-30 NOTE — Telephone Encounter (Signed)
Patient is calling for advice. She states that she is taking the medication she was prescribed, but she is coughing so bad that she is vomiting at night. She is asking for direction. Cell # verified.

## 2016-03-31 ENCOUNTER — Telehealth: Payer: Self-pay | Admitting: Family Medicine

## 2016-03-31 MED ORDER — GUAIFENESIN-DM 100-10 MG/5ML PO SYRP: 5.0000 mL | ORAL_SOLUTION | ORAL | 0 refills | Status: DC | PRN

## 2016-03-31 MED ORDER — METOPROLOL SUCCINATE ER 50 MG PO TB24: 100.0000 mg | ORAL_TABLET | Freq: Every day | ORAL | 1 refills | Status: DC

## 2016-03-31 MED ORDER — BENZONATATE 200 MG PO CAPS: 200.0000 mg | ORAL_CAPSULE | Freq: Two times a day (BID) | ORAL | 0 refills | Status: DC | PRN

## 2016-03-31 NOTE — Telephone Encounter (Signed)
Spoke with patient reviewed instructions patient verbalized understanding. 

## 2016-03-31 NOTE — Telephone Encounter (Signed)
Sorry to hear she still has her cough. She has a pulm referral appt scheduled this month for this condition.  - Can we can see if it can be moved up for her? - I called in  tessalon perles for her to use with the mucinex- dextromethorphan syrup also called in.

## 2016-03-31 NOTE — Telephone Encounter (Signed)
Pt needs to take medication as prescribed. She was told she could take a total of 100 mg a day only and monitor her HR and BP. I think this is what she is likely talking about (not a total of 250 mg).  I called in the increased dose of metoprolol 50 mg but enough for her to take 100 mg a day. She should be reporting the BP and HR as well.

## 2016-03-31 NOTE — Addendum Note (Signed)
Addended by: Felix PaciniKUNEFF, RENEE A on: 03/31/2016 04:50 PM   Modules accepted: Orders

## 2016-03-31 NOTE — Telephone Encounter (Signed)
Patient needs a refill of rx   metoprolol succinate (TOPROL-XL) 50 MG 24 hr tablet   [id number] - [place], [place] - [address] [phone] (Phone) [phone] (Fax)   Patient requests either a 100mg  or 250mg . She already has to take 2 tablets of the 50mg -up to MD discretion. Patient needs this refill today due to running out over the weekend.   Please call patient once rx is refilled.

## 2016-03-31 NOTE — Telephone Encounter (Signed)
Do you want to recommend OTC cough med?

## 2016-03-31 NOTE — Telephone Encounter (Signed)
Patient still having intense cough from all OTC medications. It intensifies during the night. Patient needs cough to cease. Patient is coughing to the point of vomiting and has loss voice from vomiting and coughing. Patient is still congested and needs to speak to nurse today about concerns. Patient "does not think she can tough it out."  Please call patient at [phone] this morning to advise.

## 2016-03-31 NOTE — Telephone Encounter (Signed)
Spoke with patient reviewed information and instructions. Patient states she called pulmonology and asked to be put on the cancellation list so she can get in sooner.

## 2016-04-03 ENCOUNTER — Emergency Department (HOSPITAL_COMMUNITY)
Admission: EM | Admit: 2016-04-03 | Discharge: 2016-04-03 | Disposition: A | Discharge status: Home / Self Care | Payer: Self-pay | Attending: Emergency Medicine | Admitting: Emergency Medicine

## 2016-04-03 ENCOUNTER — Emergency Department (HOSPITAL_COMMUNITY): Payer: Self-pay

## 2016-04-03 ENCOUNTER — Encounter (HOSPITAL_COMMUNITY): Payer: Self-pay | Admitting: Emergency Medicine

## 2016-04-03 DIAGNOSIS — I10 Essential (primary) hypertension: Secondary | ICD-10-CM | POA: Insufficient documentation

## 2016-04-03 DIAGNOSIS — Y999 Unspecified external cause status: Secondary | ICD-10-CM | POA: Insufficient documentation

## 2016-04-03 DIAGNOSIS — Y929 Unspecified place or not applicable: Secondary | ICD-10-CM | POA: Insufficient documentation

## 2016-04-03 DIAGNOSIS — S50311A Abrasion of right elbow, initial encounter: Secondary | ICD-10-CM | POA: Insufficient documentation

## 2016-04-03 DIAGNOSIS — Y939 Activity, unspecified: Secondary | ICD-10-CM | POA: Insufficient documentation

## 2016-04-03 DIAGNOSIS — Z87891 Personal history of nicotine dependence: Secondary | ICD-10-CM | POA: Insufficient documentation

## 2016-04-03 DIAGNOSIS — Z79899 Other long term (current) drug therapy: Secondary | ICD-10-CM | POA: Insufficient documentation

## 2016-04-03 DIAGNOSIS — S80211A Abrasion, right knee, initial encounter: Secondary | ICD-10-CM | POA: Insufficient documentation

## 2016-04-03 DIAGNOSIS — S0990XA Unspecified injury of head, initial encounter: Secondary | ICD-10-CM | POA: Insufficient documentation

## 2016-04-03 MED ORDER — ONDANSETRON 4 MG PO TBDP: 4.0000 mg | ORAL_TABLET | Freq: Once | ORAL | Status: DC

## 2016-04-03 MED ORDER — OXYCODONE HCL 5 MG PO TABS: 5.0000 mg | ORAL_TABLET | ORAL | 0 refills | Status: DC | PRN

## 2016-04-03 MED ORDER — ONDANSETRON 4 MG PO TBDP: 4.0000 mg | ORAL_TABLET | Freq: Three times a day (TID) | ORAL | 0 refills | Status: DC | PRN

## 2016-04-03 NOTE — ED Notes (Signed)
Abrasion to elbow and knee
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
## 2016-04-03 NOTE — ED Notes (Signed)
Patient given discharge instruction, verbalized understand. Patient ambulatory out of the department.  

## 2016-04-03 NOTE — Discharge Instructions (Signed)
You may take the oxycodone prescribed for pain relief.  This will make you drowsy - do not drive within 4 hours of taking this medication. ° °

## 2016-04-12 ENCOUNTER — Institutional Professional Consult (permissible substitution): Payer: Self-pay | Admitting: Pulmonary Disease

## 2016-05-03 ENCOUNTER — Encounter: Payer: Self-pay | Admitting: Family Medicine

## 2016-05-03 ENCOUNTER — Ambulatory Visit (INDEPENDENT_AMBULATORY_CARE_PROVIDER_SITE_OTHER): Payer: Self-pay | Admitting: Family Medicine

## 2016-05-03 VITALS — BP 135/85 | HR 96 | Temp 98.1°F | Resp 20 | Wt 172.0 lb

## 2016-05-03 DIAGNOSIS — M329 Systemic lupus erythematosus, unspecified: Secondary | ICD-10-CM

## 2016-05-03 DIAGNOSIS — G8929 Other chronic pain: Secondary | ICD-10-CM

## 2016-05-03 DIAGNOSIS — R059 Cough, unspecified: Secondary | ICD-10-CM

## 2016-05-03 DIAGNOSIS — R05 Cough: Secondary | ICD-10-CM

## 2016-05-03 DIAGNOSIS — R000 Tachycardia, unspecified: Secondary | ICD-10-CM

## 2016-05-03 DIAGNOSIS — I10 Essential (primary) hypertension: Secondary | ICD-10-CM

## 2016-05-03 DIAGNOSIS — R490 Dysphonia: Secondary | ICD-10-CM

## 2016-05-03 MED ORDER — METOPROLOL SUCCINATE ER 100 MG PO TB24: 100.0000 mg | ORAL_TABLET | Freq: Every day | ORAL | 1 refills | Status: DC

## 2016-05-03 NOTE — Patient Instructions (Signed)
Nice to see you today   I have refilled your med.  I will refer you to pain management.   Take care of yourself!  Please help [place] help you:  We are honored you have chosen [facility] for your Primary Care home. Below you will find basic instructions that you may need to access in the future. Please help [place] help you by reading the instructions, which cover many of the frequent questions we experience.   Prescription refills and request:  -In order to allow more efficient response time, please call your pharmacy for all refills. They will forward the request electronically to [place]. This allows for the quickest possible response. Request left on a nurse line can take longer to refill, since these are checked as time allows between office patients and other phone calls.  - refill request can take up to 3-5 working days to complete.  - If request is sent electronically and request is appropiate, it is usually completed in 1-2 business days.  - all patients will need to be seen routinely for all chronic medical conditions requiring prescription medications (see follow-up below). If you are overdue for follow up on your condition, you will be asked to make an appointment and we will call in enough medication to cover you until your appointment (up to 30 days).  - all controlled substances will require a face to face visit to request/refill.  - if you desire your prescriptions to go through a new pharmacy, and have an active script at original pharmacy, you will need to call your pharmacy and have scripts transferred to new pharmacy. This is completed between the pharmacy locations and not by your provider.    Results: If any images or labs were ordered, it can take up to 1 week to get results depending on the test ordered and the lab/facility running and resulting the test. - Normal or stable results, which do not need further discussion, will be released to your mychart immediately with attached  note to you. A call will not be generated for normal results. Please make certain to sign up for mychart. If you have questions on how to activate your mychart you can call the front office.  - If your results need further discussion, our office will attempt to contact you via phone, and if unable to reach you after 2 attempts, we will release your abnormal result to your mychart with instructions.  - All results will be automatically released in mychart after 1 week.  - Your provider will provide you with explanation and instruction on all relevant material in your results. Please keep in mind, results and labs may appear confusing or abnormal to the untrained eye, but it does not mean they are actually abnormal for you personally. If you have any questions about your results that are not covered, or you desire more detailed explanation than what was provided, you should make an appointment with your provider to do so.   Our office handles many outgoing and incoming calls daily. If we have not contacted you within 1 week about your results, please check your mychart to see if there is a message first and if not, then contact our office.  In helping with this matter, you help decrease call volume, and therefore allow [place] to be able to respond to patients needs more efficiently.   Acute office visits (sick visit):  An acute visit is intended for a new problem and are scheduled in shorter  time slots to allow schedule openings for patients with new problems. This is the appropriate visit to discuss a new problem. In order to provide you with excellent quality medical care with proper time for you to explain your problem, have an exam and receive treatment with instructions, these appointments should be limited to one new problem per visit. If you experience a new problem, in which you desire to be addressed, please make an acute office visit, we save openings on the schedule to accommodate you. Please do not  save your new problem for any other type of visit, let us take care of it properly and quickly for you.   Follow up visits:  Depending on your condition(s) your provider will need to see you routinely in order to provide you with quality care and prescribe medication(s). Most chronic conditions (Example: hypertension, Diabetes, depression/anxiety... etc), require visits a couple times a year. Your provider will instruct you on proper follow up for your personal medical conditions and history. Please make certain to make follow up appointments for your condition as instructed. Failing to do so could result in lapse in your medication treatment/refills. If you request a refill, and are overdue to be seen on a condition, we will always provide you with a 30 day script (once) to allow you time to schedule.    Medicare wellness (well visit): - we have a wonderful Nurse Selena Batten), that will meet with you and provide you will yearly medicare wellness visits. These visits should occur yearly (can not be scheduled less than 1 calendar year apart) and cover preventive health, immunizations, advance directives and screenings you are entitled to yearly through your medicare benefits. Do not miss out on your entitled benefits, this is when medicare will pay for these benefits to be ordered for you.  These are strongly encouraged by your provider and is the appropriate type of visit to make certain you are up to date with all preventive health benefits. If you have not had your medicare wellness exam in the last 12 months, please make certain to schedule one by calling the office and schedule your medicare wellness with Selena Batten as soon as possible.   Yearly physical (well visit):  - Adults are recommended to be seen yearly for physicals. Check with your insurance and date of your last physical, most insurances require one calendar year between physicals. Physicals include all preventive health topics, screenings, medical exam  and labs that are appropriate for gender/age and history. You may have fasting labs needed at this visit. This is a well visit (not a sick visit), acute topics should not be covered during this visit.  - Pediatric patients are seen more frequently when they are younger. Your provider will advise you on well child visit timing that is appropriate for your their age. - This is not a medicare wellness visit. Medicare wellness exams do not have an exam portion to the visit. Some medicare companies allow for a physical, some do not allow a yearly physical. If your medicare allows a yearly physical you can schedule the medicare wellness with our nurse Selena Batten and have your physical with your provider after, on the same day. Please check with insurance for your full benefits.   Late Policy/No Shows:  - all new patients should arrive 15-30 minutes earlier than appointment to allow [place] time  to  obtain all personal demographics,  insurance information and for you to complete office paperwork. - All established patients should arrive 10-15 minutes  earlier than appointment time to update all information and be checked in .  - In our best efforts to run on time, if you are late for your appointment you will be asked to either reschedule or if able, we will work you back into the schedule. There will be a wait time to work you back in the schedule,  depending on availability.  - If you are unable to make it to your appointment as scheduled, please call 24 hours ahead of time to allow [place] to fill the time slot with someone else who needs to be seen. If you do not cancel your appointment ahead of time, you may be charged a no show fee.
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
## 2016-05-04 DIAGNOSIS — R490 Dysphonia: Secondary | ICD-10-CM | POA: Insufficient documentation

## 2016-05-05 ENCOUNTER — Encounter (HOSPITAL_BASED_OUTPATIENT_CLINIC_OR_DEPARTMENT_OTHER): Payer: Self-pay | Admitting: *Deleted

## 2016-05-05 ENCOUNTER — Emergency Department (HOSPITAL_BASED_OUTPATIENT_CLINIC_OR_DEPARTMENT_OTHER)
Admission: EM | Admit: 2016-05-05 | Discharge: 2016-05-05 | Disposition: A | Discharge status: Home / Self Care | Payer: Self-pay | Attending: Emergency Medicine | Admitting: Emergency Medicine

## 2016-05-05 DIAGNOSIS — I10 Essential (primary) hypertension: Secondary | ICD-10-CM | POA: Insufficient documentation

## 2016-05-05 DIAGNOSIS — G44219 Episodic tension-type headache, not intractable: Secondary | ICD-10-CM

## 2016-05-05 DIAGNOSIS — Z79899 Other long term (current) drug therapy: Secondary | ICD-10-CM | POA: Insufficient documentation

## 2016-05-05 DIAGNOSIS — Z87891 Personal history of nicotine dependence: Secondary | ICD-10-CM | POA: Insufficient documentation

## 2016-05-05 DIAGNOSIS — R51 Headache: Secondary | ICD-10-CM | POA: Insufficient documentation

## 2016-05-05 DIAGNOSIS — R55 Syncope and collapse: Secondary | ICD-10-CM | POA: Insufficient documentation

## 2016-05-05 LAB — CBC WITH DIFFERENTIAL/PLATELET
BASOS ABS: 0 10*3/uL (ref 0.0–0.1)
BASOS PCT: 0 %
EOS ABS: 0 10*3/uL (ref 0.0–0.7)
Eosinophils Relative: 1 %
HCT: 36.7 % (ref 36.0–46.0)
HEMOGLOBIN: 12.2 g/dL (ref 12.0–15.0)
Lymphocytes Relative: 30 %
Lymphs Abs: 2.5 10*3/uL (ref 0.7–4.0)
MCH: 31.7 pg (ref 26.0–34.0)
MCHC: 33.2 g/dL (ref 30.0–36.0)
MCV: 95.3 fL (ref 78.0–100.0)
MONO ABS: 0.9 10*3/uL (ref 0.1–1.0)
MONOS PCT: 11 %
NEUTROS PCT: 58 %
Neutro Abs: 5 10*3/uL (ref 1.7–7.7)
Platelets: 209 10*3/uL (ref 150–400)
RBC: 3.85 MIL/uL — ABNORMAL LOW (ref 3.87–5.11)
RDW: 13.3 % (ref 11.5–15.5)
WBC: 8.4 10*3/uL (ref 4.0–10.5)

## 2016-05-05 LAB — BASIC METABOLIC PANEL
Anion gap: 7 (ref 5–15)
BUN: 13 mg/dL (ref 6–20)
CALCIUM: 9.3 mg/dL (ref 8.9–10.3)
CO2: 25 mmol/L (ref 22–32)
CREATININE: 0.84 mg/dL (ref 0.44–1.00)
Chloride: 109 mmol/L (ref 101–111)
Glucose, Bld: 120 mg/dL — ABNORMAL HIGH (ref 65–99)
Potassium: 3.9 mmol/L (ref 3.5–5.1)
Sodium: 141 mmol/L (ref 135–145)

## 2016-05-05 MED ORDER — SODIUM CHLORIDE 0.9 % IV BOLUS (SEPSIS)
1000.0000 mL | Freq: Once | INTRAVENOUS | Status: AC
  Administered 2016-05-05: 1000 mL via INTRAVENOUS

## 2016-05-05 MED ORDER — MORPHINE SULFATE (PF) 4 MG/ML IV SOLN
4.0000 mg | Freq: Once | INTRAVENOUS | Status: AC
Start: 2016-05-05 — End: 2016-05-05
  Administered 2016-05-05: 4 mg via INTRAVENOUS
  Filled 2016-05-05: qty 1

## 2016-05-05 MED ORDER — METOCLOPRAMIDE HCL 5 MG/ML IJ SOLN
10.0000 mg | Freq: Once | INTRAMUSCULAR | Status: AC
  Administered 2016-05-05: 10 mg via INTRAVENOUS
  Filled 2016-05-05: qty 2

## 2016-05-05 MED ORDER — DIPHENHYDRAMINE HCL 50 MG/ML IJ SOLN
50.0000 mg | Freq: Once | INTRAMUSCULAR | Status: AC
  Administered 2016-05-05: 50 mg via INTRAVENOUS
  Filled 2016-05-05: qty 1
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
## 2016-05-05 NOTE — Discharge Instructions (Signed)
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
## 2016-05-05 NOTE — ED Notes (Signed)
ED Provider at bedside. 

## 2016-05-10 ENCOUNTER — Encounter: Payer: Self-pay | Admitting: Neurology

## 2016-05-10 ENCOUNTER — Ambulatory Visit (INDEPENDENT_AMBULATORY_CARE_PROVIDER_SITE_OTHER): Payer: Self-pay | Admitting: Neurology

## 2016-05-10 ENCOUNTER — Encounter: Payer: Self-pay | Admitting: *Deleted

## 2016-05-10 VITALS — BP 164/110 | HR 92 | Ht 65.0 in | Wt 171.0 lb

## 2016-05-10 DIAGNOSIS — R51 Headache: Secondary | ICD-10-CM

## 2016-05-10 DIAGNOSIS — R519 Headache, unspecified: Secondary | ICD-10-CM

## 2016-05-10 DIAGNOSIS — M542 Cervicalgia: Secondary | ICD-10-CM

## 2016-05-10 MED ORDER — NORTRIPTYLINE HCL 10 MG PO CAPS: ORAL_CAPSULE | ORAL | 3 refills | Status: DC

## 2016-05-10 NOTE — Patient Instructions (Addendum)
Please remember, common headache triggers are: sleep deprivation, dehydration, overheating, stress, hypoglycemia or skipping meals and blood sugar fluctuations, excessive pain medications or excessive alcohol use or caffeine withdrawal. Some people have food triggers such as aged cheese, orange juice or chocolate, especially dark chocolate, or MSG (monosodium glutamate). Try to avoid these headache triggers as much possible. It may be helpful to keep a headache diary to figure out what makes your headaches worse or brings them on and what alleviates them. Some people report headache onset after exercise but studies have shown that regular exercise may actually prevent headaches from coming. If you have exercise-induced headaches, please make sure that you drink plenty of fluid before and after exercising and that you do not over do it and do not overheat. For your headaches for prevention: I suggest Pamelor (generic name: nortriptyline), 10 mg: Take 1 pill daily at bedtime for one week, then 2 pills daily at bedtime thereafter. Common side effects reported are: mouth dryness, drowsiness, confusion, dizziness.   Please see your eye doctor for your blurry vision.   Thankfully, your neurological exam is normal, you had a benign head CT and neck CT recently.   We will do a brain scan, called MRI and call you with the test results. We will have to schedule you for this on a separate date. This test requires authorization from your insurance, and we will take care of the insurance process. We will do a cervical spine (i.e. neck) MRI to look for degenerative changes and I have placed a referral to Dr. Danielle Dess for your neck pain.  Please talk to your primary care physician about your elevated blood pressure values.
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
## 2016-05-11 ENCOUNTER — Emergency Department (HOSPITAL_BASED_OUTPATIENT_CLINIC_OR_DEPARTMENT_OTHER): Payer: Self-pay

## 2016-05-11 ENCOUNTER — Emergency Department (HOSPITAL_BASED_OUTPATIENT_CLINIC_OR_DEPARTMENT_OTHER)
Admission: EM | Admit: 2016-05-11 | Discharge: 2016-05-11 | Disposition: A | Discharge status: Home / Self Care | Payer: Self-pay | Attending: Emergency Medicine | Admitting: Emergency Medicine

## 2016-05-11 ENCOUNTER — Encounter (HOSPITAL_BASED_OUTPATIENT_CLINIC_OR_DEPARTMENT_OTHER): Payer: Self-pay

## 2016-05-11 DIAGNOSIS — F0781 Postconcussional syndrome: Secondary | ICD-10-CM | POA: Insufficient documentation

## 2016-05-11 DIAGNOSIS — Y999 Unspecified external cause status: Secondary | ICD-10-CM | POA: Insufficient documentation

## 2016-05-11 DIAGNOSIS — T148XXA Other injury of unspecified body region, initial encounter: Secondary | ICD-10-CM

## 2016-05-11 DIAGNOSIS — I10 Essential (primary) hypertension: Secondary | ICD-10-CM | POA: Insufficient documentation

## 2016-05-11 DIAGNOSIS — S161XXA Strain of muscle, fascia and tendon at neck level, initial encounter: Secondary | ICD-10-CM | POA: Insufficient documentation

## 2016-05-11 DIAGNOSIS — Y939 Activity, unspecified: Secondary | ICD-10-CM | POA: Insufficient documentation

## 2016-05-11 DIAGNOSIS — Z79899 Other long term (current) drug therapy: Secondary | ICD-10-CM | POA: Insufficient documentation

## 2016-05-11 DIAGNOSIS — Z87891 Personal history of nicotine dependence: Secondary | ICD-10-CM | POA: Insufficient documentation

## 2016-05-11 DIAGNOSIS — Y929 Unspecified place or not applicable: Secondary | ICD-10-CM | POA: Insufficient documentation

## 2016-05-11 DIAGNOSIS — G44309 Post-traumatic headache, unspecified, not intractable: Secondary | ICD-10-CM

## 2016-05-11 MED ORDER — PROCHLORPERAZINE EDISYLATE 5 MG/ML IJ SOLN
10.0000 mg | Freq: Once | INTRAMUSCULAR | Status: AC
  Administered 2016-05-11: 10 mg via INTRAVENOUS
  Filled 2016-05-11: qty 2

## 2016-05-11 MED ORDER — DIPHENHYDRAMINE HCL 50 MG/ML IJ SOLN
25.0000 mg | Freq: Once | INTRAMUSCULAR | Status: AC
  Administered 2016-05-11: 25 mg via INTRAVENOUS
  Filled 2016-05-11: qty 1

## 2016-05-11 MED ORDER — MORPHINE SULFATE (PF) 2 MG/ML IV SOLN
2.0000 mg | Freq: Once | INTRAVENOUS | Status: AC
  Administered 2016-05-11: 2 mg via INTRAVENOUS
  Filled 2016-05-11: qty 1

## 2016-05-11 MED ORDER — DEXAMETHASONE SODIUM PHOSPHATE 10 MG/ML IJ SOLN
10.0000 mg | Freq: Once | INTRAMUSCULAR | Status: AC
  Administered 2016-05-11: 10 mg via INTRAVENOUS
  Filled 2016-05-11: qty 1
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
## 2016-05-11 NOTE — ED Notes (Signed)
ED Provider at bedside. 

## 2016-05-11 NOTE — ED Notes (Signed)
Patient transported to X-ray 
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
## 2016-05-13 ENCOUNTER — Encounter (HOSPITAL_BASED_OUTPATIENT_CLINIC_OR_DEPARTMENT_OTHER): Payer: Self-pay | Admitting: Emergency Medicine

## 2016-05-13 ENCOUNTER — Emergency Department (HOSPITAL_BASED_OUTPATIENT_CLINIC_OR_DEPARTMENT_OTHER)
Admission: EM | Admit: 2016-05-13 | Discharge: 2016-05-13 | Disposition: A | Discharge status: Home / Self Care | Payer: Self-pay | Attending: Emergency Medicine | Admitting: Emergency Medicine

## 2016-05-13 ENCOUNTER — Emergency Department (HOSPITAL_BASED_OUTPATIENT_CLINIC_OR_DEPARTMENT_OTHER): Payer: Self-pay

## 2016-05-13 DIAGNOSIS — S62645B Nondisplaced fracture of proximal phalanx of left ring finger, initial encounter for open fracture: Secondary | ICD-10-CM | POA: Insufficient documentation

## 2016-05-13 DIAGNOSIS — I10 Essential (primary) hypertension: Secondary | ICD-10-CM | POA: Insufficient documentation

## 2016-05-13 DIAGNOSIS — Z791 Long term (current) use of non-steroidal anti-inflammatories (NSAID): Secondary | ICD-10-CM | POA: Insufficient documentation

## 2016-05-13 DIAGNOSIS — Y929 Unspecified place or not applicable: Secondary | ICD-10-CM | POA: Insufficient documentation

## 2016-05-13 DIAGNOSIS — Z79899 Other long term (current) drug therapy: Secondary | ICD-10-CM | POA: Insufficient documentation

## 2016-05-13 DIAGNOSIS — Y9367 Activity, basketball: Secondary | ICD-10-CM | POA: Insufficient documentation

## 2016-05-13 DIAGNOSIS — W1839XA Other fall on same level, initial encounter: Secondary | ICD-10-CM | POA: Insufficient documentation

## 2016-05-13 DIAGNOSIS — Z87891 Personal history of nicotine dependence: Secondary | ICD-10-CM | POA: Insufficient documentation

## 2016-05-13 DIAGNOSIS — Y999 Unspecified external cause status: Secondary | ICD-10-CM | POA: Insufficient documentation
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
## 2016-05-13 NOTE — ED Notes (Signed)
EDP into room 

## 2016-05-13 NOTE — ED Notes (Signed)
Hand splinted, "feels better", denies need for pain med at this time, denies questions or needs, denies numbness or tingling.

## 2016-05-13 NOTE — ED Notes (Signed)
PMS intact before and after. Pt tolerated well. All questions answered. 

## 2016-05-13 NOTE — Discharge Instructions (Signed)
Keep the splint in place, apply ice to help the swelling, follow-up with hand surgeon to make sure the fracture heals properly

## 2016-05-13 NOTE — ED Triage Notes (Addendum)
Pt presents to ED with complaints of left hand pain and swelling from fall while  Playing basketball. Ring finger has  discoloration and swollen,  cap refill <2 seconds.. Ice pack applied. Pt unable to move finger.

## 2016-05-22 ENCOUNTER — Emergency Department (HOSPITAL_BASED_OUTPATIENT_CLINIC_OR_DEPARTMENT_OTHER)
Admission: EM | Admit: 2016-05-22 | Discharge: 2016-05-23 | Disposition: A | Discharge status: Home / Self Care | Payer: 59 | Attending: Emergency Medicine | Admitting: Emergency Medicine

## 2016-05-22 ENCOUNTER — Encounter (HOSPITAL_BASED_OUTPATIENT_CLINIC_OR_DEPARTMENT_OTHER): Payer: Self-pay | Admitting: *Deleted

## 2016-05-22 ENCOUNTER — Emergency Department (HOSPITAL_BASED_OUTPATIENT_CLINIC_OR_DEPARTMENT_OTHER): Payer: 59

## 2016-05-22 DIAGNOSIS — R05 Cough: Secondary | ICD-10-CM

## 2016-05-22 DIAGNOSIS — R51 Headache: Secondary | ICD-10-CM | POA: Diagnosis not present

## 2016-05-22 DIAGNOSIS — Z79899 Other long term (current) drug therapy: Secondary | ICD-10-CM | POA: Insufficient documentation

## 2016-05-22 DIAGNOSIS — Z87891 Personal history of nicotine dependence: Secondary | ICD-10-CM | POA: Diagnosis not present

## 2016-05-22 DIAGNOSIS — R059 Cough, unspecified: Secondary | ICD-10-CM

## 2016-05-22 DIAGNOSIS — I10 Essential (primary) hypertension: Secondary | ICD-10-CM | POA: Diagnosis not present

## 2016-05-22 MED ORDER — BENZONATATE 100 MG PO CAPS
200.0000 mg | ORAL_CAPSULE | Freq: Once | ORAL | Status: AC
  Administered 2016-05-23: 200 mg via ORAL
  Filled 2016-05-22: qty 2

## 2016-05-22 MED ORDER — PREDNISONE 50 MG PO TABS
60.0000 mg | ORAL_TABLET | Freq: Once | ORAL | Status: AC
  Administered 2016-05-23: 01:00:00 60 mg via ORAL
  Filled 2016-05-22: qty 1
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
## 2016-05-22 NOTE — ED Triage Notes (Signed)
Cough since yesterday. Headache. States she woke earlier today from a nap and had a choking spell. She is speaking in complete sentenced. In no distress.

## 2016-05-23 ENCOUNTER — Encounter (HOSPITAL_BASED_OUTPATIENT_CLINIC_OR_DEPARTMENT_OTHER): Payer: Self-pay | Admitting: Emergency Medicine

## 2016-05-23 MED ORDER — BENZONATATE 100 MG PO CAPS: 100.0000 mg | ORAL_CAPSULE | Freq: Three times a day (TID) | ORAL | 0 refills | Status: DC

## 2016-05-23 MED ORDER — PREDNISONE 20 MG PO TABS: ORAL_TABLET | ORAL | 0 refills | Status: DC

## 2016-05-23 NOTE — ED Notes (Signed)
Pt verbalizes understanding of d/c instructions and denies any further needs at this time. 

## 2016-06-11 ENCOUNTER — Emergency Department (HOSPITAL_BASED_OUTPATIENT_CLINIC_OR_DEPARTMENT_OTHER)
Admission: EM | Admit: 2016-06-11 | Discharge: 2016-06-11 | Disposition: A | Discharge status: Home / Self Care | Payer: 59 | Attending: Emergency Medicine | Admitting: Emergency Medicine

## 2016-06-11 ENCOUNTER — Encounter (HOSPITAL_BASED_OUTPATIENT_CLINIC_OR_DEPARTMENT_OTHER): Payer: Self-pay | Admitting: Emergency Medicine

## 2016-06-11 DIAGNOSIS — I10 Essential (primary) hypertension: Secondary | ICD-10-CM | POA: Insufficient documentation

## 2016-06-11 DIAGNOSIS — Z87891 Personal history of nicotine dependence: Secondary | ICD-10-CM | POA: Insufficient documentation

## 2016-06-11 DIAGNOSIS — M542 Cervicalgia: Secondary | ICD-10-CM

## 2016-06-11 DIAGNOSIS — Z79899 Other long term (current) drug therapy: Secondary | ICD-10-CM | POA: Diagnosis not present

## 2016-06-11 DIAGNOSIS — S199XXD Unspecified injury of neck, subsequent encounter: Secondary | ICD-10-CM | POA: Insufficient documentation

## 2016-06-11 DIAGNOSIS — F0781 Postconcussional syndrome: Secondary | ICD-10-CM | POA: Diagnosis not present

## 2016-06-11 DIAGNOSIS — G4459 Other complicated headache syndrome: Secondary | ICD-10-CM | POA: Diagnosis not present

## 2016-06-11 DIAGNOSIS — G894 Chronic pain syndrome: Secondary | ICD-10-CM | POA: Diagnosis not present

## 2016-06-11 MED ORDER — PROCHLORPERAZINE MALEATE 10 MG PO TABS
10.0000 mg | ORAL_TABLET | Freq: Once | ORAL | Status: AC
  Administered 2016-06-11: 10 mg via ORAL
  Filled 2016-06-11: qty 1

## 2016-06-11 MED ORDER — DIPHENHYDRAMINE HCL 25 MG PO CAPS
25.0000 mg | ORAL_CAPSULE | Freq: Once | ORAL | Status: AC
  Administered 2016-06-11: 25 mg via ORAL
  Filled 2016-06-11: qty 1

## 2016-06-11 MED ORDER — DIAZEPAM 5 MG/ML IJ SOLN
5.0000 mg | Freq: Once | INTRAMUSCULAR | Status: AC
  Administered 2016-06-11: 5 mg via INTRAMUSCULAR
  Filled 2016-06-11: qty 2

## 2016-06-11 MED ORDER — ACETAMINOPHEN 325 MG PO TABS: 650.0000 mg | ORAL_TABLET | Freq: Once | ORAL | Status: DC

## 2016-06-11 MED ORDER — ACETAMINOPHEN 500 MG PO TABS
1000.0000 mg | ORAL_TABLET | Freq: Once | ORAL | Status: AC
  Administered 2016-06-11: 1000 mg via ORAL
  Filled 2016-06-11: qty 2
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
## 2016-06-17 ENCOUNTER — Emergency Department (HOSPITAL_COMMUNITY)
Admission: EM | Admit: 2016-06-17 | Discharge: 2016-06-17 | Disposition: A | Discharge status: Home / Self Care | Payer: 59 | Attending: Emergency Medicine | Admitting: Emergency Medicine

## 2016-06-17 ENCOUNTER — Encounter (HOSPITAL_COMMUNITY): Payer: Self-pay | Admitting: *Deleted

## 2016-06-17 DIAGNOSIS — Z79899 Other long term (current) drug therapy: Secondary | ICD-10-CM | POA: Insufficient documentation

## 2016-06-17 DIAGNOSIS — I10 Essential (primary) hypertension: Secondary | ICD-10-CM | POA: Insufficient documentation

## 2016-06-17 DIAGNOSIS — Z87891 Personal history of nicotine dependence: Secondary | ICD-10-CM | POA: Insufficient documentation

## 2016-06-17 DIAGNOSIS — G44221 Chronic tension-type headache, intractable: Secondary | ICD-10-CM | POA: Insufficient documentation

## 2016-06-17 DIAGNOSIS — R51 Headache: Secondary | ICD-10-CM | POA: Diagnosis present
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
## 2016-06-17 NOTE — ED Notes (Signed)
Pt and family unhappy that pt is unable to get a dose of morphine.  Pt states that she got this before and was without headache for 3 days.  Dr. Bebe ShaggyWickline informed pt that narcotics can cause rebound headaches and are not indicated for treatment of headaches.  Family member states "We are done with this place" after Dr. Left room.  Pt thanked Charity fundraiserN for being kind and left without further problems.

## 2016-06-17 NOTE — Discharge Instructions (Signed)
°  SEEK MEDICAL ATTENTION IF: ° °You develop possible problems with medications prescribed.  °The medications don't resolve your headache, if it recurs , or if you have multiple episodes of vomiting or can't take fluids. °You have a change from the usual headache. ° °RETURN IMMEDIATELY IF you develop a sudden, severe headache or confusion, become poorly responsive or faint, develop a fever above 100.4F or problem breathing, have a change in speech, vision, swallowing, or understanding, or develop new weakness, numbness, tingling, incoordination, or have a seizure. ° °
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
## 2016-06-17 NOTE — ED Notes (Signed)
Pt states understanding of care given and follow up instructions.  A/o, ambulated from ED with steady gait

## 2016-06-17 NOTE — ED Notes (Signed)
Pt and family concerned about not getting anything for pain and requesting to speak with Dr Bebe ShaggyWickline.  Dr. Bebe ShaggyWickline return to room to speak with patient

## 2016-06-22 ENCOUNTER — Ambulatory Visit
Admission: RE | Admit: 2016-06-22 | Discharge: 2016-06-22 | Disposition: A | Discharge status: Home / Self Care | Payer: 59 | Source: Ambulatory Visit | Attending: Neurology | Admitting: Neurology

## 2016-06-22 DIAGNOSIS — R51 Headache: Principal | ICD-10-CM

## 2016-06-22 DIAGNOSIS — M542 Cervicalgia: Secondary | ICD-10-CM

## 2016-06-22 DIAGNOSIS — R519 Headache, unspecified: Secondary | ICD-10-CM

## 2016-06-27 NOTE — Progress Notes (Signed)
MRI brain wo contrast on 06/22/16 was normal. C spine MRI wo contrast from 06/22/16 showed mild degenerative changes at C3/4 on the right, but no nerve root compression ie pinched nerve, no cord appearance changes, certainly nothing that warrants more eval or surgical consultation.  Pls notify patient.  Huston FoleySaima Locklan Canoy, MD, PhD [facility])

## 2016-06-29 ENCOUNTER — Inpatient Hospital Stay: Admission: RE | Admit: 2016-06-29 | Payer: 59 | Source: Ambulatory Visit

## 2016-06-29 ENCOUNTER — Telehealth: Payer: Self-pay

## 2016-06-29 ENCOUNTER — Telehealth: Payer: Self-pay | Admitting: Neurology

## 2016-06-29 ENCOUNTER — Other Ambulatory Visit: Payer: Self-pay

## 2016-06-29 NOTE — Telephone Encounter (Signed)
I called but no answer and mail box was full I will try back later.

## 2016-06-29 NOTE — Telephone Encounter (Signed)
I called pt. I advised her that her MRI brain was normal and that her MRI c spine showed mild degenerative changes at C3/4 on the right but no nerve root compression such as a pinched nerve, cord appearance changes, and nothing that warrants more evaluation or surgery. I asked her to keep her follow up appt.s Pt verbalized understanding of results. Pt had no questions at this time but was encouraged to call back if questions arise.

## 2016-06-29 NOTE — Telephone Encounter (Signed)
-----   Message from Huston FoleySaima Athar, MD sent at 06/27/2016  6:27 PM EDT ----- MRI brain wo contrast on 06/22/16 was normal. C spine MRI wo contrast from 06/22/16 showed mild degenerative changes at C3/4 on the right, but no nerve root compression ie pinched nerve, no cord appearance changes, certainly nothing that warrants more eval or surgical consultation.  Pls notify patient.  Huston FoleySaima Athar, MD, PhD [facility])

## 2016-06-29 NOTE — Telephone Encounter (Signed)
I called patient back but no answer and vm is full

## 2016-06-29 NOTE — Telephone Encounter (Signed)
Pt called requesting MRI results, please call

## 2016-07-01 ENCOUNTER — Ambulatory Visit (HOSPITAL_COMMUNITY)
Admission: EM | Admit: 2016-07-01 | Discharge: 2016-07-01 | Disposition: A | Discharge status: Home / Self Care | Payer: 59 | Attending: Internal Medicine | Admitting: Internal Medicine

## 2016-07-01 ENCOUNTER — Encounter (HOSPITAL_COMMUNITY): Payer: Self-pay | Admitting: Emergency Medicine

## 2016-07-01 DIAGNOSIS — R05 Cough: Secondary | ICD-10-CM

## 2016-07-01 DIAGNOSIS — J40 Bronchitis, not specified as acute or chronic: Secondary | ICD-10-CM | POA: Diagnosis not present

## 2016-07-01 DIAGNOSIS — R059 Cough, unspecified: Secondary | ICD-10-CM

## 2016-07-01 MED ORDER — GUAIFENESIN-CODEINE 100-10 MG/5ML PO SYRP: 5.0000 mL | ORAL_SOLUTION | Freq: Three times a day (TID) | ORAL | 0 refills | Status: DC | PRN

## 2016-07-01 MED ORDER — AZITHROMYCIN 250 MG PO TABS: 250.0000 mg | ORAL_TABLET | Freq: Every day | ORAL | 0 refills | Status: DC

## 2016-07-01 MED ORDER — METHYLPREDNISOLONE 4 MG PO TBPK: ORAL_TABLET | ORAL | 0 refills | Status: DC

## 2016-07-01 MED ORDER — ALBUTEROL SULFATE HFA 108 (90 BASE) MCG/ACT IN AERS: 1.0000 | INHALATION_SPRAY | Freq: Four times a day (QID) | RESPIRATORY_TRACT | 0 refills | Status: DC | PRN
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
## 2016-07-01 NOTE — ED Triage Notes (Signed)
Pt here for cold sx onset 2 weeks associated w/laryngitis, prod cough, fever  Taking Tessalon Pearles w/no relief.   A&O x4... NAD.... Ambulatory

## 2016-07-15 ENCOUNTER — Ambulatory Visit (HOSPITAL_COMMUNITY)
Admission: EM | Admit: 2016-07-15 | Discharge: 2016-07-15 | Disposition: A | Discharge status: Home / Self Care | Payer: 59 | Attending: Family Medicine | Admitting: Family Medicine

## 2016-07-15 ENCOUNTER — Encounter (HOSPITAL_COMMUNITY): Payer: Self-pay | Admitting: Emergency Medicine

## 2016-07-15 DIAGNOSIS — M542 Cervicalgia: Secondary | ICD-10-CM | POA: Diagnosis not present

## 2016-07-15 MED ORDER — HYDROCODONE-ACETAMINOPHEN 5-325 MG PO TABS: 1.0000 | ORAL_TABLET | Freq: Four times a day (QID) | ORAL | 0 refills | Status: DC | PRN
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
## 2016-07-15 NOTE — Discharge Instructions (Signed)
Keep appointment with pain management on July 3rd.

## 2016-07-20 ENCOUNTER — Encounter: Payer: Self-pay | Admitting: Family Medicine

## 2016-07-20 ENCOUNTER — Ambulatory Visit (INDEPENDENT_AMBULATORY_CARE_PROVIDER_SITE_OTHER): Payer: 59 | Admitting: Family Medicine

## 2016-07-20 VITALS — BP 152/96 | HR 107 | Temp 97.9°F | Resp 20 | Wt 177.5 lb

## 2016-07-20 DIAGNOSIS — I10 Essential (primary) hypertension: Secondary | ICD-10-CM

## 2016-07-20 DIAGNOSIS — R000 Tachycardia, unspecified: Secondary | ICD-10-CM | POA: Diagnosis not present

## 2016-07-20 MED ORDER — HYDROCHLOROTHIAZIDE 25 MG PO TABS: 25.0000 mg | ORAL_TABLET | Freq: Every day | ORAL | 1 refills | Status: DC

## 2016-07-20 MED ORDER — AMLODIPINE BESYLATE 5 MG PO TABS: 5.0000 mg | ORAL_TABLET | Freq: Every day | ORAL | 5 refills | Status: DC

## 2016-07-20 NOTE — Patient Instructions (Signed)
Stop losartan. Continue metoprolol.  Start amlodipine 5 mg a day and HCTZ 25 mg a day. Take your BP at least 2 hours after taking medications.  If routinely above 140/90 then increase HCTZ to 50 mg a day. If lower than 110/60 then decrease the amlodipine to 2.5 mg a day.    Follow up in 6 months sooner if needed.   I will be thinking of you all. Good luck to you.
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
## 2016-07-26 ENCOUNTER — Encounter (HOSPITAL_BASED_OUTPATIENT_CLINIC_OR_DEPARTMENT_OTHER): Payer: Self-pay | Admitting: *Deleted

## 2016-07-26 ENCOUNTER — Emergency Department (HOSPITAL_BASED_OUTPATIENT_CLINIC_OR_DEPARTMENT_OTHER)
Admission: EM | Admit: 2016-07-26 | Discharge: 2016-07-26 | Disposition: A | Discharge status: Home / Self Care | Payer: 59 | Attending: Emergency Medicine | Admitting: Emergency Medicine

## 2016-07-26 ENCOUNTER — Emergency Department (HOSPITAL_BASED_OUTPATIENT_CLINIC_OR_DEPARTMENT_OTHER): Payer: 59

## 2016-07-26 DIAGNOSIS — R011 Cardiac murmur, unspecified: Secondary | ICD-10-CM | POA: Diagnosis not present

## 2016-07-26 DIAGNOSIS — F17210 Nicotine dependence, cigarettes, uncomplicated: Secondary | ICD-10-CM | POA: Insufficient documentation

## 2016-07-26 DIAGNOSIS — Y9289 Other specified places as the place of occurrence of the external cause: Secondary | ICD-10-CM | POA: Insufficient documentation

## 2016-07-26 DIAGNOSIS — Y939 Activity, unspecified: Secondary | ICD-10-CM | POA: Insufficient documentation

## 2016-07-26 DIAGNOSIS — I10 Essential (primary) hypertension: Secondary | ICD-10-CM | POA: Diagnosis not present

## 2016-07-26 DIAGNOSIS — Y999 Unspecified external cause status: Secondary | ICD-10-CM | POA: Insufficient documentation

## 2016-07-26 DIAGNOSIS — Z885 Allergy status to narcotic agent status: Secondary | ICD-10-CM | POA: Insufficient documentation

## 2016-07-26 DIAGNOSIS — S60042A Contusion of left ring finger without damage to nail, initial encounter: Secondary | ICD-10-CM | POA: Insufficient documentation

## 2016-07-26 DIAGNOSIS — Z79899 Other long term (current) drug therapy: Secondary | ICD-10-CM | POA: Diagnosis not present

## 2016-07-26 DIAGNOSIS — S6992XA Unspecified injury of left wrist, hand and finger(s), initial encounter: Secondary | ICD-10-CM | POA: Diagnosis present

## 2016-07-26 DIAGNOSIS — W231XXA Caught, crushed, jammed, or pinched between stationary objects, initial encounter: Secondary | ICD-10-CM | POA: Diagnosis not present

## 2016-07-26 MED ORDER — NAPROXEN 500 MG PO TABS: ORAL_TABLET | ORAL | 0 refills | Status: DC
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
## 2016-08-09 ENCOUNTER — Ambulatory Visit: Payer: Self-pay | Admitting: Neurology

## 2016-08-09 ENCOUNTER — Telehealth: Payer: Self-pay

## 2016-08-09 NOTE — Telephone Encounter (Signed)
Pt did not show for their appt with Dr. Athar today.  

## 2016-08-10 ENCOUNTER — Encounter: Payer: Self-pay | Admitting: Neurology

## 2016-08-22 ENCOUNTER — Encounter: Payer: Self-pay | Admitting: Family Medicine

## 2016-08-22 ENCOUNTER — Ambulatory Visit: Payer: 59 | Admitting: Family Medicine

## 2016-08-30 ENCOUNTER — Emergency Department (HOSPITAL_BASED_OUTPATIENT_CLINIC_OR_DEPARTMENT_OTHER): Payer: 59

## 2016-08-30 ENCOUNTER — Emergency Department (HOSPITAL_BASED_OUTPATIENT_CLINIC_OR_DEPARTMENT_OTHER)
Admission: EM | Admit: 2016-08-30 | Discharge: 2016-08-31 | Disposition: A | Discharge status: Home / Self Care | Payer: 59 | Attending: Physician Assistant | Admitting: Physician Assistant

## 2016-08-30 ENCOUNTER — Encounter (HOSPITAL_BASED_OUTPATIENT_CLINIC_OR_DEPARTMENT_OTHER): Payer: Self-pay

## 2016-08-30 DIAGNOSIS — Z79899 Other long term (current) drug therapy: Secondary | ICD-10-CM | POA: Insufficient documentation

## 2016-08-30 DIAGNOSIS — N3090 Cystitis, unspecified without hematuria: Secondary | ICD-10-CM

## 2016-08-30 DIAGNOSIS — I10 Essential (primary) hypertension: Secondary | ICD-10-CM | POA: Insufficient documentation

## 2016-08-30 DIAGNOSIS — M545 Low back pain: Secondary | ICD-10-CM | POA: Insufficient documentation

## 2016-08-30 DIAGNOSIS — Z87891 Personal history of nicotine dependence: Secondary | ICD-10-CM | POA: Insufficient documentation

## 2016-08-30 LAB — COMPREHENSIVE METABOLIC PANEL
ALT: 108 U/L — ABNORMAL HIGH (ref 14–54)
AST: 36 U/L (ref 15–41)
Albumin: 4.4 g/dL (ref 3.5–5.0)
Alkaline Phosphatase: 108 U/L (ref 38–126)
Anion gap: 11 (ref 5–15)
BUN: 12 mg/dL (ref 6–20)
CHLORIDE: 100 mmol/L — AB (ref 101–111)
CO2: 28 mmol/L (ref 22–32)
Calcium: 9.3 mg/dL (ref 8.9–10.3)
Creatinine, Ser: 0.96 mg/dL (ref 0.44–1.00)
GFR calc Af Amer: >60 mL/min (ref >60)
Glucose, Bld: 102 mg/dL — ABNORMAL HIGH (ref 65–99)
POTASSIUM: 3.3 mmol/L — AB (ref 3.5–5.1)
SODIUM: 139 mmol/L (ref 135–145)
Total Bilirubin: 0.4 mg/dL (ref 0.3–1.2)
Total Protein: 7.6 g/dL (ref 6.5–8.1)

## 2016-08-30 LAB — URINALYSIS, ROUTINE W REFLEX MICROSCOPIC
BILIRUBIN URINE: NEGATIVE
Glucose, UA: NEGATIVE mg/dL
Ketones, ur: NEGATIVE mg/dL
NITRITE: NEGATIVE
PH: 7 (ref 5.0–8.0)
Protein, ur: 30 mg/dL — AB
SPECIFIC GRAVITY, URINE: 1.014 (ref 1.005–1.030)

## 2016-08-30 LAB — CBC WITH DIFFERENTIAL/PLATELET
BASOS ABS: 0 10*3/uL (ref 0.0–0.1)
Basophils Relative: 0 %
EOS ABS: 0.1 10*3/uL (ref 0.0–0.7)
EOS PCT: 1 %
HCT: 38.2 % (ref 36.0–46.0)
Hemoglobin: 12.8 g/dL (ref 12.0–15.0)
LYMPHS PCT: 42 %
Lymphs Abs: 3.4 10*3/uL (ref 0.7–4.0)
MCH: 31.1 pg (ref 26.0–34.0)
MCHC: 33.5 g/dL (ref 30.0–36.0)
MCV: 92.9 fL (ref 78.0–100.0)
MONO ABS: 0.8 10*3/uL (ref 0.1–1.0)
Monocytes Relative: 10 %
Neutro Abs: 3.7 10*3/uL (ref 1.7–7.7)
Neutrophils Relative %: 47 %
PLATELETS: 219 10*3/uL (ref 150–400)
RBC: 4.11 MIL/uL (ref 3.87–5.11)
RDW: 13.3 % (ref 11.5–15.5)
WBC: 8 10*3/uL (ref 4.0–10.5)

## 2016-08-30 LAB — URINALYSIS, MICROSCOPIC (REFLEX)

## 2016-08-30 MED ORDER — MORPHINE SULFATE (PF) 4 MG/ML IV SOLN
4.0000 mg | Freq: Once | INTRAVENOUS | Status: AC
  Administered 2016-08-30: 4 mg via INTRAVENOUS
  Filled 2016-08-30: qty 1

## 2016-08-30 MED ORDER — SODIUM CHLORIDE 0.9 % IV BOLUS (SEPSIS)
1000.0000 mL | Freq: Once | INTRAVENOUS | Status: AC
  Administered 2016-08-30: 1000 mL via INTRAVENOUS

## 2016-08-30 MED ORDER — CEPHALEXIN 500 MG PO CAPS: 500.0000 mg | ORAL_CAPSULE | Freq: Four times a day (QID) | ORAL | 0 refills | Status: DC

## 2016-08-30 MED ORDER — CEPHALEXIN 250 MG PO CAPS
500.0000 mg | ORAL_CAPSULE | Freq: Once | ORAL | Status: AC
  Administered 2016-08-30: 500 mg via ORAL
  Filled 2016-08-30: qty 2

## 2016-08-30 MED ORDER — ONDANSETRON HCL 4 MG/2ML IJ SOLN
4.0000 mg | Freq: Once | INTRAMUSCULAR | Status: AC
  Administered 2016-08-30: 4 mg via INTRAVENOUS
  Filled 2016-08-30: qty 2

## 2016-08-30 NOTE — Discharge Instructions (Signed)
You are found to have a urinary tract infection today. Please take antibiotics and follow up with your primary care physician.
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
## 2016-08-30 NOTE — ED Notes (Signed)
ED Provider at bedside. 

## 2016-08-30 NOTE — ED Triage Notes (Signed)
C/o hematuria, lower back pain x 3 days-NAD-steady gait

## 2016-09-02 LAB — URINE CULTURE

## 2016-09-03 ENCOUNTER — Telehealth: Payer: Self-pay

## 2016-09-03 NOTE — Telephone Encounter (Signed)
Post ED Visit - Positive Culture Follow-up  Culture report reviewed by antimicrobial stewardship pharmacist:  []  Rebecca Boyle, Pharm.D. []  Rebecca Boyle, Pharm.D., BCPS AQ-ID []  Rebecca Boyle, Pharm.D., BCPS []  Rebecca Boyle, Pharm.D., BCPS []  [name], 17[address] Rainbow BoulevardPharm.D., BCPS, AAHIVP []  Rebecca Boyle, Pharm.D., BCPS, AAHIVP [x]  Rebecca Boyle, PharmD, BCPS []  Rebecca Boyle, PharmD, BCPS []  Rebecca Boyle, PharmD, BCPS  Positive urine culture Treated with Cephalexin, organism sensitive to the same and no further patient follow-up is required at this time.  [name], [name] 09/03/2016, 9:19 AM

## 2016-10-01 ENCOUNTER — Emergency Department (HOSPITAL_BASED_OUTPATIENT_CLINIC_OR_DEPARTMENT_OTHER)
Admission: EM | Admit: 2016-10-01 | Discharge: 2016-10-02 | Disposition: A | Discharge status: Home / Self Care | Payer: 59 | Attending: Emergency Medicine | Admitting: Emergency Medicine

## 2016-10-01 ENCOUNTER — Encounter (HOSPITAL_BASED_OUTPATIENT_CLINIC_OR_DEPARTMENT_OTHER): Payer: Self-pay | Admitting: Emergency Medicine

## 2016-10-01 DIAGNOSIS — Z87891 Personal history of nicotine dependence: Secondary | ICD-10-CM | POA: Insufficient documentation

## 2016-10-01 DIAGNOSIS — N12 Tubulo-interstitial nephritis, not specified as acute or chronic: Secondary | ICD-10-CM | POA: Insufficient documentation

## 2016-10-01 DIAGNOSIS — I10 Essential (primary) hypertension: Secondary | ICD-10-CM | POA: Insufficient documentation

## 2016-10-01 DIAGNOSIS — Z79899 Other long term (current) drug therapy: Secondary | ICD-10-CM | POA: Insufficient documentation

## 2016-10-01 LAB — URINALYSIS, MICROSCOPIC (REFLEX)

## 2016-10-01 LAB — URINALYSIS, ROUTINE W REFLEX MICROSCOPIC
Bilirubin Urine: NEGATIVE
Glucose, UA: NEGATIVE mg/dL
Ketones, ur: NEGATIVE mg/dL
Nitrite: NEGATIVE
PROTEIN: 100 mg/dL — AB
SPECIFIC GRAVITY, URINE: 1.02 (ref 1.005–1.030)
pH: 6 (ref 5.0–8.0)

## 2016-10-01 NOTE — ED Triage Notes (Signed)
Pt presents with lower back pain and blood in urine. Pt states she was treated for UTI  A couple weeks ago.

## 2016-10-02 LAB — BASIC METABOLIC PANEL
Anion gap: 9 (ref 5–15)
BUN: 14 mg/dL (ref 6–20)
CHLORIDE: 102 mmol/L (ref 101–111)
CO2: 27 mmol/L (ref 22–32)
Calcium: 9.1 mg/dL (ref 8.9–10.3)
Creatinine, Ser: 0.81 mg/dL (ref 0.44–1.00)
GFR calc Af Amer: >60 mL/min (ref >60)
GFR calc non Af Amer: >60 mL/min (ref >60)
GLUCOSE: 101 mg/dL — AB (ref 65–99)
Potassium: 3.4 mmol/L — ABNORMAL LOW (ref 3.5–5.1)
Sodium: 138 mmol/L (ref 135–145)

## 2016-10-02 LAB — CBC
HCT: 37.6 % (ref 36.0–46.0)
Hemoglobin: 12.5 g/dL (ref 12.0–15.0)
MCH: 31.1 pg (ref 26.0–34.0)
MCHC: 33.2 g/dL (ref 30.0–36.0)
MCV: 93.5 fL (ref 78.0–100.0)
PLATELETS: 190 10*3/uL (ref 150–400)
RBC: 4.02 MIL/uL (ref 3.87–5.11)
RDW: 12.7 % (ref 11.5–15.5)
WBC: 6.3 10*3/uL (ref 4.0–10.5)

## 2016-10-02 MED ORDER — ONDANSETRON HCL 4 MG/2ML IJ SOLN
4.0000 mg | Freq: Once | INTRAMUSCULAR | Status: AC
  Administered 2016-10-02: 4 mg via INTRAVENOUS
  Filled 2016-10-02: qty 2

## 2016-10-02 MED ORDER — SODIUM CHLORIDE 0.9 % IV BOLUS (SEPSIS)
1000.0000 mL | Freq: Once | INTRAVENOUS | Status: AC
  Administered 2016-10-02: 1000 mL via INTRAVENOUS

## 2016-10-02 MED ORDER — CEPHALEXIN 500 MG PO CAPS: 500.0000 mg | ORAL_CAPSULE | Freq: Four times a day (QID) | ORAL | 0 refills | Status: DC

## 2016-10-02 MED ORDER — DEXTROSE 5 % IV SOLN
1.0000 g | Freq: Once | INTRAVENOUS | Status: AC
  Administered 2016-10-02: 1 g via INTRAVENOUS
  Filled 2016-10-02: qty 10

## 2016-10-02 MED ORDER — ONDANSETRON 8 MG PO TBDP: 8.0000 mg | ORAL_TABLET | Freq: Three times a day (TID) | ORAL | 0 refills | Status: DC | PRN
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
## 2016-10-03 LAB — URINE CULTURE

## 2016-10-20 ENCOUNTER — Encounter (HOSPITAL_BASED_OUTPATIENT_CLINIC_OR_DEPARTMENT_OTHER): Payer: Self-pay | Admitting: Adult Health

## 2016-10-20 DIAGNOSIS — Z79899 Other long term (current) drug therapy: Secondary | ICD-10-CM | POA: Insufficient documentation

## 2016-10-20 DIAGNOSIS — R3129 Other microscopic hematuria: Secondary | ICD-10-CM | POA: Insufficient documentation

## 2016-10-20 DIAGNOSIS — R109 Unspecified abdominal pain: Secondary | ICD-10-CM | POA: Insufficient documentation

## 2016-10-20 DIAGNOSIS — Z87891 Personal history of nicotine dependence: Secondary | ICD-10-CM | POA: Insufficient documentation

## 2016-10-20 DIAGNOSIS — I10 Essential (primary) hypertension: Secondary | ICD-10-CM | POA: Insufficient documentation

## 2016-10-20 LAB — URINALYSIS, ROUTINE W REFLEX MICROSCOPIC
Bilirubin Urine: NEGATIVE
Glucose, UA: NEGATIVE mg/dL
KETONES UR: NEGATIVE mg/dL
LEUKOCYTES UA: NEGATIVE
NITRITE: NEGATIVE
PH: 6.5 (ref 5.0–8.0)
Protein, ur: NEGATIVE mg/dL
Specific Gravity, Urine: 1.015 (ref 1.005–1.030)

## 2016-10-20 LAB — URINALYSIS, MICROSCOPIC (REFLEX)

## 2016-10-20 NOTE — ED Triage Notes (Signed)
PResents with back pain, hematuria and generalized fatiue. She reports that she has been on keflex x 2 and IV rocephin recently for 2 UTIs. Just finished last round of keflex. ENorses feeling worse.

## 2016-10-21 ENCOUNTER — Emergency Department (HOSPITAL_BASED_OUTPATIENT_CLINIC_OR_DEPARTMENT_OTHER)
Admission: EM | Admit: 2016-10-21 | Discharge: 2016-10-21 | Disposition: A | Discharge status: Home / Self Care | Payer: Self-pay | Attending: Emergency Medicine | Admitting: Emergency Medicine

## 2016-10-21 ENCOUNTER — Emergency Department (HOSPITAL_BASED_OUTPATIENT_CLINIC_OR_DEPARTMENT_OTHER): Payer: Self-pay

## 2016-10-21 DIAGNOSIS — R3129 Other microscopic hematuria: Secondary | ICD-10-CM

## 2016-10-21 DIAGNOSIS — R109 Unspecified abdominal pain: Secondary | ICD-10-CM

## 2016-10-21 LAB — CBC WITH DIFFERENTIAL/PLATELET
BASOS ABS: 0 10*3/uL (ref 0.0–0.1)
Basophils Relative: 0 %
EOS PCT: 2 %
Eosinophils Absolute: 0.2 10*3/uL (ref 0.0–0.7)
HEMATOCRIT: 38.9 % (ref 36.0–46.0)
Hemoglobin: 13.2 g/dL (ref 12.0–15.0)
LYMPHS PCT: 50 %
Lymphs Abs: 3.5 10*3/uL (ref 0.7–4.0)
MCH: 31.3 pg (ref 26.0–34.0)
MCHC: 33.9 g/dL (ref 30.0–36.0)
MCV: 92.2 fL (ref 78.0–100.0)
MONO ABS: 0.8 10*3/uL (ref 0.1–1.0)
MONOS PCT: 11 %
NEUTROS ABS: 2.6 10*3/uL (ref 1.7–7.7)
Neutrophils Relative %: 37 %
PLATELETS: 213 10*3/uL (ref 150–400)
RBC: 4.22 MIL/uL (ref 3.87–5.11)
RDW: 12.8 % (ref 11.5–15.5)
WBC: 7 10*3/uL (ref 4.0–10.5)

## 2016-10-21 LAB — BASIC METABOLIC PANEL
ANION GAP: 6 (ref 5–15)
BUN: 14 mg/dL (ref 6–20)
CO2: 30 mmol/L (ref 22–32)
Calcium: 9.2 mg/dL (ref 8.9–10.3)
Chloride: 103 mmol/L (ref 101–111)
Creatinine, Ser: 0.76 mg/dL (ref 0.44–1.00)
GFR calc Af Amer: >60 mL/min (ref >60)
GLUCOSE: 98 mg/dL (ref 65–99)
POTASSIUM: 3.8 mmol/L (ref 3.5–5.1)
Sodium: 139 mmol/L (ref 135–145)

## 2016-10-21 MED ORDER — METHOCARBAMOL 500 MG PO TABS
1000.0000 mg | ORAL_TABLET | Freq: Once | ORAL | Status: AC
  Administered 2016-10-21: 1000 mg via ORAL
  Filled 2016-10-21: qty 2

## 2016-10-21 MED ORDER — FOSFOMYCIN TROMETHAMINE 3 G PO PACK
3.0000 g | PACK | Freq: Once | ORAL | Status: AC
  Administered 2016-10-21: 3 g via ORAL
  Filled 2016-10-21: qty 3

## 2016-10-21 MED ORDER — METHOCARBAMOL 500 MG PO TABS: 500.0000 mg | ORAL_TABLET | Freq: Two times a day (BID) | ORAL | 0 refills | Status: DC

## 2016-10-21 NOTE — ED Notes (Signed)
MD at bedside to go over results and follow up information. Pt given information for urology due to hematuria and PCP due to potential lesions on lungs and the need for a CT. Pt voiced understanding of d/c information. Pt voiced understanding of muscle relaxer use.

## 2016-10-21 NOTE — ED Notes (Signed)
Patient transported to CT 

## 2016-10-21 NOTE — ED Notes (Signed)
Pt has had a few episodes of incontinence and feeling "glass" when she urinates
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
## 2016-10-24 ENCOUNTER — Other Ambulatory Visit: Payer: Self-pay | Admitting: *Deleted

## 2016-10-24 MED ORDER — METOPROLOL SUCCINATE ER 100 MG PO TB24: 100.0000 mg | ORAL_TABLET | Freq: Every day | ORAL | 0 refills | Status: DC

## 2016-11-01 ENCOUNTER — Ambulatory Visit: Payer: Self-pay | Admitting: Family Medicine

## 2016-11-01 DIAGNOSIS — Z0289 Encounter for other administrative examinations: Secondary | ICD-10-CM

## 2016-11-09 ENCOUNTER — Emergency Department (HOSPITAL_BASED_OUTPATIENT_CLINIC_OR_DEPARTMENT_OTHER)
Admission: EM | Admit: 2016-11-09 | Discharge: 2016-11-10 | Disposition: A | Discharge status: Home / Self Care | Payer: BLUE CROSS/BLUE SHIELD | Attending: Emergency Medicine | Admitting: Emergency Medicine

## 2016-11-09 ENCOUNTER — Encounter (HOSPITAL_BASED_OUTPATIENT_CLINIC_OR_DEPARTMENT_OTHER): Payer: Self-pay | Admitting: Emergency Medicine

## 2016-11-09 ENCOUNTER — Emergency Department (HOSPITAL_BASED_OUTPATIENT_CLINIC_OR_DEPARTMENT_OTHER): Payer: BLUE CROSS/BLUE SHIELD

## 2016-11-09 DIAGNOSIS — R079 Chest pain, unspecified: Secondary | ICD-10-CM | POA: Diagnosis not present

## 2016-11-09 DIAGNOSIS — R05 Cough: Secondary | ICD-10-CM | POA: Diagnosis present

## 2016-11-09 DIAGNOSIS — I10 Essential (primary) hypertension: Secondary | ICD-10-CM | POA: Insufficient documentation

## 2016-11-09 DIAGNOSIS — Z79899 Other long term (current) drug therapy: Secondary | ICD-10-CM | POA: Insufficient documentation

## 2016-11-09 DIAGNOSIS — Z87891 Personal history of nicotine dependence: Secondary | ICD-10-CM | POA: Insufficient documentation

## 2016-11-09 DIAGNOSIS — R059 Cough, unspecified: Secondary | ICD-10-CM

## 2016-11-09 DIAGNOSIS — R0602 Shortness of breath: Secondary | ICD-10-CM | POA: Insufficient documentation

## 2016-11-09 HISTORY — DX: Opioid dependence, uncomplicated: F11.20

## 2016-11-09 LAB — CBC WITH DIFFERENTIAL/PLATELET
BASOS ABS: 0 10*3/uL (ref 0.0–0.1)
BASOS PCT: 0 %
Eosinophils Absolute: 0.1 10*3/uL (ref 0.0–0.7)
Eosinophils Relative: 1 %
HEMATOCRIT: 39.1 % (ref 36.0–46.0)
Hemoglobin: 13.2 g/dL (ref 12.0–15.0)
Lymphocytes Relative: 57 %
Lymphs Abs: 5 10*3/uL — ABNORMAL HIGH (ref 0.7–4.0)
MCH: 31.2 pg (ref 26.0–34.0)
MCHC: 33.8 g/dL (ref 30.0–36.0)
MCV: 92.4 fL (ref 78.0–100.0)
MONO ABS: 1 10*3/uL (ref 0.1–1.0)
Monocytes Relative: 11 %
NEUTROS ABS: 2.7 10*3/uL (ref 1.7–7.7)
Neutrophils Relative %: 31 %
PLATELETS: 205 10*3/uL (ref 150–400)
RBC: 4.23 MIL/uL (ref 3.87–5.11)
RDW: 13.1 % (ref 11.5–15.5)
WBC: 8.8 10*3/uL (ref 4.0–10.5)

## 2016-11-09 LAB — BASIC METABOLIC PANEL
ANION GAP: 9 (ref 5–15)
BUN: 10 mg/dL (ref 6–20)
CALCIUM: 9.7 mg/dL (ref 8.9–10.3)
CO2: 33 mmol/L — ABNORMAL HIGH (ref 22–32)
Chloride: 97 mmol/L — ABNORMAL LOW (ref 101–111)
Creatinine, Ser: 0.93 mg/dL (ref 0.44–1.00)
GLUCOSE: 106 mg/dL — AB (ref 65–99)
Potassium: 2.8 mmol/L — ABNORMAL LOW (ref 3.5–5.1)
SODIUM: 139 mmol/L (ref 135–145)

## 2016-11-09 LAB — TROPONIN I: Troponin I: 0.03 ng/mL (ref <0.03)

## 2016-11-09 MED ORDER — IBUPROFEN 400 MG PO TABS
600.0000 mg | ORAL_TABLET | Freq: Once | ORAL | Status: AC
  Administered 2016-11-09: 23:00:00 600 mg via ORAL
  Filled 2016-11-09: qty 1

## 2016-11-09 MED ORDER — POTASSIUM CHLORIDE CRYS ER 20 MEQ PO TBCR
40.0000 meq | EXTENDED_RELEASE_TABLET | Freq: Once | ORAL | Status: AC
  Administered 2016-11-10: 40 meq via ORAL
  Filled 2016-11-09: qty 2

## 2016-11-09 MED ORDER — HYDROCOD POLST-CPM POLST ER 10-8 MG/5ML PO SUER
5.0000 mL | Freq: Once | ORAL | Status: AC
  Administered 2016-11-09: 5 mL via ORAL
  Filled 2016-11-09: qty 5

## 2016-11-09 MED ORDER — POTASSIUM CHLORIDE 10 MEQ/100ML IV SOLN
10.0000 meq | Freq: Once | INTRAVENOUS | Status: AC
  Administered 2016-11-10: 10 meq via INTRAVENOUS
  Filled 2016-11-09: qty 100

## 2016-11-09 MED ORDER — IOPAMIDOL (ISOVUE-370) INJECTION 76%
100.0000 mL | Freq: Once | INTRAVENOUS | Status: AC | PRN
  Administered 2016-11-09: 100 mL via INTRAVENOUS

## 2016-11-09 MED ORDER — HYDROCODONE-HOMATROPINE 5-1.5 MG/5ML PO SYRP
5.0000 mL | ORAL_SOLUTION | Freq: Once | ORAL | Status: DC
  Filled 2016-11-09: qty 5

## 2016-11-09 MED ORDER — IPRATROPIUM-ALBUTEROL 0.5-2.5 (3) MG/3ML IN SOLN
3.0000 mL | Freq: Once | RESPIRATORY_TRACT | Status: AC
  Administered 2016-11-09: 3 mL via RESPIRATORY_TRACT
  Filled 2016-11-09: qty 3

## 2016-11-09 NOTE — ED Triage Notes (Addendum)
C/o cough, "pain in my right lung" x 4 days-NAD-steady gait-pt states she was here for another issue and was advised "incidental finding" that she has lung nodules and is waiting to be seen by pulmonologist

## 2016-11-09 NOTE — ED Notes (Signed)
Pt on monitor 

## 2016-11-09 NOTE — ED Notes (Signed)
Patient transported to CT 

## 2016-11-10 MED ORDER — PREDNISONE 20 MG PO TABS: 40.0000 mg | ORAL_TABLET | Freq: Every day | ORAL | 0 refills | Status: DC

## 2016-11-10 MED ORDER — POTASSIUM CHLORIDE CRYS ER 20 MEQ PO TBCR: 20.0000 meq | EXTENDED_RELEASE_TABLET | Freq: Two times a day (BID) | ORAL | 0 refills | Status: DC

## 2016-11-10 MED ORDER — POTASSIUM CHLORIDE CRYS ER 20 MEQ PO TBCR
40.0000 meq | EXTENDED_RELEASE_TABLET | Freq: Once | ORAL | Status: AC
  Administered 2016-11-10: 40 meq via ORAL
  Filled 2016-11-10: qty 2

## 2016-11-10 MED ORDER — POTASSIUM CHLORIDE CRYS ER 20 MEQ PO TBCR
EXTENDED_RELEASE_TABLET | ORAL | Status: AC
  Filled 2016-11-10: qty 1
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
## 2016-11-10 NOTE — Discharge Instructions (Signed)
Your imaging and lab work has been reassuring. Your CT scan shows no sign of pulmonary embolism and does not show the ground glass that was seen on your previous CT scan. Your potassium was quite low in the ED. Have given a short course to take at home. Also given a short course of steroids. Take once a day for 4 days. Continue using her albuterol inhaler. Please stop smoking. Follow-up with her pulmonologist and primary care doctor. Return to the ED if he develops any chest pain, worsening shortness of breath, fevers or for any other reason.

## 2016-11-19 ENCOUNTER — Emergency Department (HOSPITAL_BASED_OUTPATIENT_CLINIC_OR_DEPARTMENT_OTHER): Payer: BLUE CROSS/BLUE SHIELD

## 2016-11-19 ENCOUNTER — Emergency Department (HOSPITAL_BASED_OUTPATIENT_CLINIC_OR_DEPARTMENT_OTHER)
Admission: EM | Admit: 2016-11-19 | Discharge: 2016-11-19 | Disposition: A | Discharge status: Home / Self Care | Payer: BLUE CROSS/BLUE SHIELD | Attending: Emergency Medicine | Admitting: Emergency Medicine

## 2016-11-19 ENCOUNTER — Encounter (HOSPITAL_BASED_OUTPATIENT_CLINIC_OR_DEPARTMENT_OTHER): Payer: Self-pay

## 2016-11-19 DIAGNOSIS — Z79899 Other long term (current) drug therapy: Secondary | ICD-10-CM | POA: Diagnosis not present

## 2016-11-19 DIAGNOSIS — R1030 Lower abdominal pain, unspecified: Secondary | ICD-10-CM | POA: Insufficient documentation

## 2016-11-19 DIAGNOSIS — I10 Essential (primary) hypertension: Secondary | ICD-10-CM | POA: Insufficient documentation

## 2016-11-19 DIAGNOSIS — Z87891 Personal history of nicotine dependence: Secondary | ICD-10-CM | POA: Diagnosis not present

## 2016-11-19 DIAGNOSIS — R300 Dysuria: Secondary | ICD-10-CM | POA: Insufficient documentation

## 2016-11-19 DIAGNOSIS — Y939 Activity, unspecified: Secondary | ICD-10-CM | POA: Insufficient documentation

## 2016-11-19 DIAGNOSIS — S93401A Sprain of unspecified ligament of right ankle, initial encounter: Secondary | ICD-10-CM | POA: Diagnosis not present

## 2016-11-19 DIAGNOSIS — Y998 Other external cause status: Secondary | ICD-10-CM | POA: Diagnosis not present

## 2016-11-19 DIAGNOSIS — W010XXA Fall on same level from slipping, tripping and stumbling without subsequent striking against object, initial encounter: Secondary | ICD-10-CM | POA: Diagnosis not present

## 2016-11-19 DIAGNOSIS — Y929 Unspecified place or not applicable: Secondary | ICD-10-CM | POA: Insufficient documentation

## 2016-11-19 DIAGNOSIS — R52 Pain, unspecified: Secondary | ICD-10-CM

## 2016-11-19 DIAGNOSIS — S99911A Unspecified injury of right ankle, initial encounter: Secondary | ICD-10-CM | POA: Diagnosis present

## 2016-11-19 DIAGNOSIS — R319 Hematuria, unspecified: Secondary | ICD-10-CM | POA: Insufficient documentation

## 2016-11-19 LAB — URINALYSIS, ROUTINE W REFLEX MICROSCOPIC
BILIRUBIN URINE: NEGATIVE
Bilirubin Urine: NEGATIVE
Glucose, UA: NEGATIVE mg/dL
Glucose, UA: NEGATIVE mg/dL
Hgb urine dipstick: NEGATIVE
Hgb urine dipstick: NEGATIVE
KETONES UR: NEGATIVE mg/dL
KETONES UR: NEGATIVE mg/dL
LEUKOCYTES UA: NEGATIVE
NITRITE: NEGATIVE
NITRITE: NEGATIVE
PH: 7.5 (ref 5.0–8.0)
PROTEIN: NEGATIVE mg/dL
PROTEIN: NEGATIVE mg/dL
Specific Gravity, Urine: <1.005 — ABNORMAL LOW (ref 1.005–1.030)
Specific Gravity, Urine: <1.005 — ABNORMAL LOW (ref 1.005–1.030)
pH: 7.5 (ref 5.0–8.0)

## 2016-11-19 LAB — COMPREHENSIVE METABOLIC PANEL
ALK PHOS: 73 U/L (ref 38–126)
ALT: 53 U/L (ref 14–54)
ANION GAP: 9 (ref 5–15)
AST: 45 U/L — ABNORMAL HIGH (ref 15–41)
Albumin: 4.7 g/dL (ref 3.5–5.0)
BUN: 12 mg/dL (ref 6–20)
CALCIUM: 9.8 mg/dL (ref 8.9–10.3)
CHLORIDE: 102 mmol/L (ref 101–111)
CO2: 29 mmol/L (ref 22–32)
Creatinine, Ser: 0.71 mg/dL (ref 0.44–1.00)
Glucose, Bld: 94 mg/dL (ref 65–99)
Potassium: 3.3 mmol/L — ABNORMAL LOW (ref 3.5–5.1)
Sodium: 140 mmol/L (ref 135–145)
Total Bilirubin: 1.1 mg/dL (ref 0.3–1.2)
Total Protein: 8.4 g/dL — ABNORMAL HIGH (ref 6.5–8.1)

## 2016-11-19 LAB — CBC WITH DIFFERENTIAL/PLATELET
Basophils Absolute: 0 10*3/uL (ref 0.0–0.1)
Basophils Relative: 0 %
EOS PCT: 1 %
Eosinophils Absolute: 0.1 10*3/uL (ref 0.0–0.7)
HCT: 41.1 % (ref 36.0–46.0)
Hemoglobin: 13.9 g/dL (ref 12.0–15.0)
LYMPHS ABS: 2.5 10*3/uL (ref 0.7–4.0)
Lymphocytes Relative: 42 %
MCH: 31.3 pg (ref 26.0–34.0)
MCHC: 33.8 g/dL (ref 30.0–36.0)
MCV: 92.6 fL (ref 78.0–100.0)
MONOS PCT: 13 %
Monocytes Absolute: 0.7 10*3/uL (ref 0.1–1.0)
Neutro Abs: 2.6 10*3/uL (ref 1.7–7.7)
Neutrophils Relative %: 44 %
PLATELETS: 233 10*3/uL (ref 150–400)
RBC: 4.44 MIL/uL (ref 3.87–5.11)
RDW: 13.1 % (ref 11.5–15.5)
WBC: 5.9 10*3/uL (ref 4.0–10.5)

## 2016-11-19 LAB — PREGNANCY, URINE: PREG TEST UR: NEGATIVE

## 2016-11-19 LAB — URINALYSIS, MICROSCOPIC (REFLEX): RBC / HPF: NONE SEEN RBC/hpf (ref 0–5)

## 2016-11-19 MED ORDER — PHENAZOPYRIDINE HCL 100 MG PO TABS
95.0000 mg | ORAL_TABLET | Freq: Once | ORAL | Status: DC
  Filled 2016-11-19: qty 1

## 2016-11-19 MED ORDER — PHENAZOPYRIDINE HCL 200 MG PO TABS: 200.0000 mg | ORAL_TABLET | Freq: Three times a day (TID) | ORAL | 0 refills | Status: DC | PRN

## 2016-11-19 MED ORDER — NAPROXEN 250 MG PO TABS
500.0000 mg | ORAL_TABLET | Freq: Once | ORAL | Status: AC
  Administered 2016-11-19: 500 mg via ORAL
  Filled 2016-11-19: qty 2

## 2016-11-19 MED ORDER — PHENAZOPYRIDINE HCL 100 MG PO TABS: 200.0000 mg | ORAL_TABLET | Freq: Once | ORAL | Status: DC

## 2016-11-19 MED ORDER — ONDANSETRON 4 MG PO TBDP
4.0000 mg | ORAL_TABLET | Freq: Once | ORAL | Status: AC
  Administered 2016-11-19: 4 mg via ORAL
  Filled 2016-11-19: qty 1

## 2016-11-19 MED ORDER — PROMETHAZINE HCL 25 MG PO TABS: 25.0000 mg | ORAL_TABLET | Freq: Four times a day (QID) | ORAL | 0 refills | Status: DC | PRN

## 2016-11-19 NOTE — ED Notes (Signed)
Pt ambulatory unassisted, in NAD. 

## 2016-11-19 NOTE — Discharge Instructions (Signed)
It is very important for you to follow up with urology as soon as possible for further testing. Return to ER if you have worsening pain, fevers, large amounts of blood in urine, or severe vomiting.

## 2016-11-19 NOTE — ED Notes (Signed)
Pt refused pyridium. MD aware. Naprosyn ordered. Pt reported she would rather not be charged for the pyridium here since she was getting a prescription, she would rather just get her prescription instead.

## 2016-11-19 NOTE — ED Triage Notes (Signed)
Pt reports she is having blood in her urine. Pt reports she has had this problem before and has been on antibiotics before. Pt reports it seems like after she comes off the antibiotics the blood comes back. Pt reports back pain, weight loss, and bladder pain. Pt reports 2 days ago she injured her right ankle.
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
## 2016-11-20 LAB — URINE CULTURE
CULTURE: NO GROWTH
Culture: NO GROWTH

## 2016-12-05 ENCOUNTER — Ambulatory Visit (HOSPITAL_COMMUNITY): Payer: BLUE CROSS/BLUE SHIELD

## 2016-12-05 ENCOUNTER — Encounter (HOSPITAL_COMMUNITY): Payer: Self-pay | Admitting: Emergency Medicine

## 2016-12-05 ENCOUNTER — Ambulatory Visit (INDEPENDENT_AMBULATORY_CARE_PROVIDER_SITE_OTHER): Payer: BLUE CROSS/BLUE SHIELD

## 2016-12-05 ENCOUNTER — Ambulatory Visit (HOSPITAL_COMMUNITY)
Admission: EM | Admit: 2016-12-05 | Discharge: 2016-12-05 | Disposition: A | Discharge status: Home / Self Care | Payer: BLUE CROSS/BLUE SHIELD | Attending: Family Medicine | Admitting: Family Medicine

## 2016-12-05 DIAGNOSIS — R05 Cough: Secondary | ICD-10-CM | POA: Diagnosis not present

## 2016-12-05 DIAGNOSIS — J209 Acute bronchitis, unspecified: Secondary | ICD-10-CM | POA: Diagnosis not present

## 2016-12-05 MED ORDER — PREDNISONE 20 MG PO TABS: 40.0000 mg | ORAL_TABLET | Freq: Every day | ORAL | 0 refills | Status: AC

## 2016-12-05 MED ORDER — HYDROCOD POLST-CPM POLST ER 10-8 MG/5ML PO SUER: 5.0000 mL | Freq: Every evening | ORAL | 0 refills | Status: DC | PRN

## 2016-12-05 NOTE — Discharge Instructions (Signed)
Xray negative for pneumonia. Mild wheezing on exam, start prednisone as directed. Albuterol as needed for shortness of breath/wheezing. Tussionex at night for cough. Follow up with PCP for further evaluation needed. Tylenol/motrin for fever. Monitor for any worsening of symptoms, chest pain, shortness of breath, wheezing, swelling of the throat, follow up for reevaluation.   For sore throat try using a honey-based tea. Use 3 teaspoons of honey with juice squeezed from half lemon. Place shaved pieces of ginger into 1/2-1 cup of water and warm over stove top. Then mix the ingredients and repeat every 4 hours as needed.

## 2016-12-05 NOTE — ED Triage Notes (Signed)
Pt sts cough and URI sx with pain with breathing x 3 days; pt sts fever
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
## 2016-12-18 ENCOUNTER — Other Ambulatory Visit: Payer: Self-pay

## 2016-12-18 ENCOUNTER — Emergency Department (HOSPITAL_BASED_OUTPATIENT_CLINIC_OR_DEPARTMENT_OTHER)
Admission: EM | Admit: 2016-12-18 | Discharge: 2016-12-18 | Disposition: A | Discharge status: Home / Self Care | Payer: BLUE CROSS/BLUE SHIELD | Attending: Emergency Medicine | Admitting: Emergency Medicine

## 2016-12-18 ENCOUNTER — Encounter (HOSPITAL_BASED_OUTPATIENT_CLINIC_OR_DEPARTMENT_OTHER): Payer: Self-pay | Admitting: *Deleted

## 2016-12-18 ENCOUNTER — Emergency Department (HOSPITAL_BASED_OUTPATIENT_CLINIC_OR_DEPARTMENT_OTHER): Payer: BLUE CROSS/BLUE SHIELD

## 2016-12-18 DIAGNOSIS — Y92018 Other place in single-family (private) house as the place of occurrence of the external cause: Secondary | ICD-10-CM | POA: Diagnosis not present

## 2016-12-18 DIAGNOSIS — S60221A Contusion of right hand, initial encounter: Secondary | ICD-10-CM | POA: Diagnosis not present

## 2016-12-18 DIAGNOSIS — Y999 Unspecified external cause status: Secondary | ICD-10-CM | POA: Diagnosis not present

## 2016-12-18 DIAGNOSIS — S60511A Abrasion of right hand, initial encounter: Secondary | ICD-10-CM | POA: Insufficient documentation

## 2016-12-18 DIAGNOSIS — Z87891 Personal history of nicotine dependence: Secondary | ICD-10-CM | POA: Insufficient documentation

## 2016-12-18 DIAGNOSIS — Y9389 Activity, other specified: Secondary | ICD-10-CM | POA: Insufficient documentation

## 2016-12-18 DIAGNOSIS — W230XXA Caught, crushed, jammed, or pinched between moving objects, initial encounter: Secondary | ICD-10-CM | POA: Insufficient documentation

## 2016-12-18 DIAGNOSIS — I10 Essential (primary) hypertension: Secondary | ICD-10-CM | POA: Diagnosis not present

## 2016-12-18 DIAGNOSIS — S6991XA Unspecified injury of right wrist, hand and finger(s), initial encounter: Secondary | ICD-10-CM | POA: Diagnosis present

## 2016-12-18 MED ORDER — HYDROCODONE-ACETAMINOPHEN 5-325 MG PO TABS
1.0000 | ORAL_TABLET | Freq: Once | ORAL | Status: AC
  Administered 2016-12-18: 1 via ORAL
  Filled 2016-12-18 (×2): qty 1

## 2016-12-18 MED ORDER — ONDANSETRON 4 MG PO TBDP
4.0000 mg | ORAL_TABLET | Freq: Once | ORAL | Status: AC
  Administered 2016-12-18: 4 mg via ORAL
  Filled 2016-12-18: qty 1
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
## 2016-12-18 NOTE — ED Notes (Signed)
PMS intact before and after. Pt tolerated well. All questions answered. 

## 2016-12-18 NOTE — ED Triage Notes (Signed)
Pt presents large hematoma to back of right hand. States her dog got out and the front door slammed on her right hand. Ring removed in triage and given to patient.

## 2016-12-18 NOTE — ED Notes (Signed)
I went in to give pt her pain medication and she stated that she "did not want to get sick. I tried to tell them that." I offered to get the pt something for nausea that she could take prior, but she stated she wanted to "get them in an injection". I explained to her that no matter how you get a narcotic, there is a chance you will get sick. She refused the pill and Dr. Wilkie AyeHorton was advised. She will go in to reevaluate pt and discuss this with her.

## 2016-12-18 NOTE — Discharge Instructions (Signed)
You were seen today for a hematoma to the right hand.  There is not a fracture.  Keep iced and elevated.  Take ibuprofen as needed for pain.  Apply bacitracin or antibiotic ointment to wounds.

## 2016-12-19 ENCOUNTER — Encounter: Payer: Self-pay | Admitting: Family Medicine

## 2016-12-19 ENCOUNTER — Ambulatory Visit: Payer: BLUE CROSS/BLUE SHIELD | Admitting: Family Medicine

## 2016-12-19 VITALS — BP 97/64 | HR 62 | Temp 98.1°F | Resp 20 | Wt 159.2 lb

## 2016-12-19 DIAGNOSIS — S6721XA Crushing injury of right hand, initial encounter: Secondary | ICD-10-CM

## 2016-12-19 MED ORDER — OXYCODONE-ACETAMINOPHEN 5-325 MG PO TABS: 1.0000 | ORAL_TABLET | Freq: Two times a day (BID) | ORAL | 0 refills | Status: DC | PRN
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
## 2016-12-19 NOTE — Patient Instructions (Signed)
Acute pain script provided today to be used ONLY if nsaids not affective. No more than 2 a day. No refills on this medication.  Keep clean and dry.  Epson salt soaks and ICE. Encourage you to try to move fingers at least 5 times a day as shown.  It will take time for full improvement.    Acute Compartment Syndrome Compartment syndrome is a painful condition that occurs when swelling and pressure build up in a body space (compartment) of the arms or legs. Groups of muscles, nerves, and blood vessels in the arms and legs are separated into various compartments. Each compartment is surrounded by tough layers of tissue (fascia). In compartment syndrome, pressure builds up within the layers of fascia and begins to push on the structures within that compartment. In acute compartment syndrome, the pressure builds up suddenly, often as the result of an injury. If pressure continues to increase, it can block the flow of blood in the smallest blood vessels (capillaries). Then the muscles in the compartment cannot get enough oxygen and nutrients and will start to die within 4-6 hours. The nerves will begin to die within 12-24 hours. This condition is a medical emergency that must be treated with surgery. What are the causes? This condition may be caused by:  Injury. Some injuries can cause swelling or bleeding in a compartment. This can lead to compartment syndrome. Injuries that may cause this problem include: ? Broken bones, especially the long bones of the arms and legs. ? Crushing injuries. ? Penetrating injuries, such as a knife wound. ? Badly bruised muscles. ? Poisonous bites, such as a snake bite. ? Severe burns.  Blocked blood flow. This could be a result of: ? A cast or bandage that is too tight. ? A surgical procedure. Blood flow sometimes has to be stopped for a while during a surgery, usually with a tourniquet. ? Lying for too long in a position that restricts blood flow. This can happen in  people who have nerve damage or if a person is unconscious for a long time. ? Medicines used to build up muscles (anabolic steroids). ? Medicines that keep the blood from forming clots (blood thinners).  What are the signs or symptoms? The most common symptom of this condition is pain. The pain:  May be far more severe than it should be for the injury you have.  May get worse: ? When moving or stretching the affected body part. ? When the area is pushed or squeezed. ? When raising (elevating) affected body part above the level of the heart.  May come with a feeling of tingling or burning.  May not get better when you take pain medicine.  Other symptoms include:  A feeling of tightness or fullness in the affected area.  A loss of feeling.  Weakness in the area.  Loss of movement.  Skin becoming pale, tight, and shiny over the painful area.  Warmth and tenderness.  Tensing when the affected area is touched.  How is this diagnosed? This condition may be diagnosed based on:  Your physical exam and symptoms.  Measuring the pressure in the affected area (compartment pressure measurement).  Tests to rule out other problems, such as: ? X-rays. ? Blood tests. ? Ultrasound.  How is this treated? Treatment for this condition uses a procedure called fasciotomy. In this procedure, incisions are made through the fascia to relieve the pressure in the compartment and to prevent permanent damage. Before the surgery, first-aid treatment  is done, which may include:  Treating any injury.  Loosening or removing any cast, bandage, or external wrap that may be causing pain.  Elevating the painful arm or leg to the same level as the heart.  Giving oxygen.  Giving fluids through an IV tube.  Pain medicine.  Summary  Compartment syndrome occurs when swelling and pressure build up in a body space (compartment) of the arms or legs.  First aid treatment may include loosening or  removing a cast, bandage, or wrap and elevating the painful arm or leg at the level of the heart.  In acute compartment syndrome, the pressure builds up suddenly, often as the result of an injury.  This condition is a medical emergency that must be treated with a surgical procedure called fasciotomy. This procedure relieves the pressure and prevents permanent damage. This information is not intended to replace advice given to you by your health care provider. Make sure you discuss any questions you have with your health care provider. Document Released: 01/04/2009 Document Revised: 01/06/2016 Document Reviewed: 01/06/2016 Elsevier Interactive Patient Education  2017 [employer]

## 2016-12-22 ENCOUNTER — Other Ambulatory Visit: Payer: Self-pay

## 2016-12-22 ENCOUNTER — Encounter (HOSPITAL_COMMUNITY): Payer: Self-pay

## 2016-12-22 ENCOUNTER — Ambulatory Visit (HOSPITAL_COMMUNITY)
Admission: EM | Admit: 2016-12-22 | Discharge: 2016-12-23 | Disposition: A | Discharge status: Home / Self Care | Payer: BLUE CROSS/BLUE SHIELD | Attending: Emergency Medicine | Admitting: Emergency Medicine

## 2016-12-22 DIAGNOSIS — I10 Essential (primary) hypertension: Secondary | ICD-10-CM | POA: Diagnosis not present

## 2016-12-22 DIAGNOSIS — L02511 Cutaneous abscess of right hand: Secondary | ICD-10-CM | POA: Insufficient documentation

## 2016-12-22 DIAGNOSIS — F329 Major depressive disorder, single episode, unspecified: Secondary | ICD-10-CM | POA: Insufficient documentation

## 2016-12-22 DIAGNOSIS — Z79899 Other long term (current) drug therapy: Secondary | ICD-10-CM | POA: Diagnosis not present

## 2016-12-22 DIAGNOSIS — S60221A Contusion of right hand, initial encounter: Secondary | ICD-10-CM | POA: Diagnosis not present

## 2016-12-22 DIAGNOSIS — W230XXA Caught, crushed, jammed, or pinched between moving objects, initial encounter: Secondary | ICD-10-CM | POA: Diagnosis not present

## 2016-12-22 DIAGNOSIS — M329 Systemic lupus erythematosus, unspecified: Secondary | ICD-10-CM | POA: Diagnosis not present

## 2016-12-22 DIAGNOSIS — Z87891 Personal history of nicotine dependence: Secondary | ICD-10-CM | POA: Diagnosis not present

## 2016-12-22 DIAGNOSIS — F4310 Post-traumatic stress disorder, unspecified: Secondary | ICD-10-CM | POA: Insufficient documentation

## 2016-12-22 DIAGNOSIS — Y929 Unspecified place or not applicable: Secondary | ICD-10-CM | POA: Diagnosis not present

## 2016-12-22 DIAGNOSIS — K219 Gastro-esophageal reflux disease without esophagitis: Secondary | ICD-10-CM | POA: Insufficient documentation

## 2016-12-22 LAB — CBC WITH DIFFERENTIAL/PLATELET
BASOS ABS: 0 10*3/uL (ref 0.0–0.1)
BASOS PCT: 0 %
EOS ABS: 0.1 10*3/uL (ref 0.0–0.7)
Eosinophils Relative: 1 %
HEMATOCRIT: 38.2 % (ref 36.0–46.0)
Hemoglobin: 12.9 g/dL (ref 12.0–15.0)
Lymphocytes Relative: 55 %
Lymphs Abs: 4.1 10*3/uL — ABNORMAL HIGH (ref 0.7–4.0)
MCH: 31.8 pg (ref 26.0–34.0)
MCHC: 33.8 g/dL (ref 30.0–36.0)
MCV: 94.1 fL (ref 78.0–100.0)
MONO ABS: 0.7 10*3/uL (ref 0.1–1.0)
MONOS PCT: 9 %
NEUTROS ABS: 2.6 10*3/uL (ref 1.7–7.7)
Neutrophils Relative %: 35 %
PLATELETS: 194 10*3/uL (ref 150–400)
RBC: 4.06 MIL/uL (ref 3.87–5.11)
RDW: 13.4 % (ref 11.5–15.5)
WBC: 7.5 10*3/uL (ref 4.0–10.5)

## 2016-12-22 LAB — BASIC METABOLIC PANEL
ANION GAP: 11 (ref 5–15)
BUN: 13 mg/dL (ref 6–20)
CHLORIDE: 98 mmol/L — AB (ref 101–111)
CO2: 28 mmol/L (ref 22–32)
Calcium: 9.1 mg/dL (ref 8.9–10.3)
Creatinine, Ser: 0.7 mg/dL (ref 0.44–1.00)
GFR calc non Af Amer: >60 mL/min (ref >60)
Glucose, Bld: 98 mg/dL (ref 65–99)
POTASSIUM: 3.2 mmol/L — AB (ref 3.5–5.1)
Sodium: 137 mmol/L (ref 135–145)

## 2016-12-22 MED ORDER — MORPHINE SULFATE (PF) 4 MG/ML IV SOLN
4.0000 mg | Freq: Once | INTRAVENOUS | Status: AC
  Administered 2016-12-22: 4 mg via INTRAVENOUS
  Filled 2016-12-22: qty 1

## 2016-12-22 MED ORDER — METOCLOPRAMIDE HCL 5 MG/ML IJ SOLN
5.0000 mg | Freq: Once | INTRAMUSCULAR | Status: AC
  Administered 2016-12-22: 5 mg via INTRAVENOUS
  Filled 2016-12-22: qty 2

## 2016-12-22 NOTE — ED Notes (Signed)
Nurse currently drawing labs 
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
## 2016-12-22 NOTE — ED Notes (Signed)
Pt slammed hand in house door on Sunday. [facility], follow-up at [facility]CP on Wednesday. Pt reports increased swelling since incident. Pt has scabbed area underneath index and middle finger, dark in color. Pts hand and fingers look dusky. Pt reports taking ibuprofen with no relief. Pt has been soaking her hand in Epsom salt per PCP. Pt has +2 R radial pulse and <3 sec capillary refill to R fingers. Pt reports decreased sensation to index and middle finger.

## 2016-12-22 NOTE — ED Triage Notes (Signed)
Per Pt, Pt is coming from PCP with complaints of right hand injury with swelling and worsening pain. Pt was reported to have compartment syndrome from PCP and was told to come here if it gets worse. Complains of numbness to the right pointer finger. Capillary refill <3 seconds and pulses noted in the right hand.

## 2016-12-23 ENCOUNTER — Encounter (HOSPITAL_COMMUNITY): Payer: Self-pay | Admitting: Certified Registered"

## 2016-12-23 ENCOUNTER — Emergency Department (HOSPITAL_COMMUNITY): Payer: BLUE CROSS/BLUE SHIELD | Admitting: Certified Registered"

## 2016-12-23 ENCOUNTER — Encounter (HOSPITAL_COMMUNITY)
Admission: EM | Disposition: A | Discharge status: Home / Self Care | Payer: Self-pay | Source: Home / Self Care | Attending: Emergency Medicine

## 2016-12-23 HISTORY — PX: I & D EXTREMITY: SHX5045

## 2016-12-23 SURGERY — IRRIGATION AND DEBRIDEMENT EXTREMITY
Anesthesia: General | Site: Hand | Laterality: Right

## 2016-12-23 MED ORDER — OXYCODONE HCL 5 MG PO TABS: ORAL_TABLET | ORAL | 0 refills | Status: DC

## 2016-12-23 MED ORDER — MIDAZOLAM HCL 5 MG/5ML IJ SOLN
INTRAMUSCULAR | Status: DC | PRN
  Administered 2016-12-23: 2 mg via INTRAVENOUS

## 2016-12-23 MED ORDER — PROPOFOL 10 MG/ML IV BOLUS
INTRAVENOUS | Status: AC
  Filled 2016-12-23: qty 20

## 2016-12-23 MED ORDER — VANCOMYCIN HCL 1000 MG IV SOLR
INTRAVENOUS | Status: AC
  Filled 2016-12-23: qty 1000

## 2016-12-23 MED ORDER — HYDROMORPHONE HCL 1 MG/ML IJ SOLN: 0.2500 mg | INTRAMUSCULAR | Status: DC | PRN

## 2016-12-23 MED ORDER — SCOPOLAMINE 1 MG/3DAYS TD PT72
MEDICATED_PATCH | TRANSDERMAL | Status: DC | PRN
  Administered 2016-12-23: 1 via TRANSDERMAL

## 2016-12-23 MED ORDER — POTASSIUM CHLORIDE 10 MEQ/100ML IV SOLN: 10.0000 meq | Freq: Once | INTRAVENOUS | Status: DC

## 2016-12-23 MED ORDER — SUFENTANIL CITRATE 50 MCG/ML IV SOLN
INTRAVENOUS | Status: AC
  Filled 2016-12-23: qty 1

## 2016-12-23 MED ORDER — SUCCINYLCHOLINE CHLORIDE 200 MG/10ML IV SOSY
PREFILLED_SYRINGE | INTRAVENOUS | Status: DC | PRN
  Administered 2016-12-23: 80 mg via INTRAVENOUS

## 2016-12-23 MED ORDER — SCOPOLAMINE 1 MG/3DAYS TD PT72
MEDICATED_PATCH | TRANSDERMAL | Status: AC
  Filled 2016-12-23: qty 1

## 2016-12-23 MED ORDER — 0.9 % SODIUM CHLORIDE (POUR BTL) OPTIME
TOPICAL | Status: DC | PRN
  Administered 2016-12-23: 1000 mL

## 2016-12-23 MED ORDER — PHENYLEPHRINE 40 MCG/ML (10ML) SYRINGE FOR IV PUSH (FOR BLOOD PRESSURE SUPPORT)
PREFILLED_SYRINGE | INTRAVENOUS | Status: AC
  Filled 2016-12-23: qty 10

## 2016-12-23 MED ORDER — SODIUM CHLORIDE 0.9 % IJ SOLN
INTRAMUSCULAR | Status: AC
  Filled 2016-12-23: qty 10

## 2016-12-23 MED ORDER — LIDOCAINE 2% (20 MG/ML) 5 ML SYRINGE
INTRAMUSCULAR | Status: DC | PRN
  Administered 2016-12-23: 100 mg via INTRAVENOUS

## 2016-12-23 MED ORDER — VANCOMYCIN HCL IN DEXTROSE 1-5 GM/200ML-% IV SOLN
INTRAVENOUS | Status: AC
  Filled 2016-12-23: qty 200

## 2016-12-23 MED ORDER — DEXAMETHASONE SODIUM PHOSPHATE 10 MG/ML IJ SOLN
INTRAMUSCULAR | Status: DC | PRN
  Administered 2016-12-23: 10 mg via INTRAVENOUS

## 2016-12-23 MED ORDER — ONDANSETRON HCL 4 MG/2ML IJ SOLN
INTRAMUSCULAR | Status: AC
  Filled 2016-12-23: qty 2

## 2016-12-23 MED ORDER — LACTATED RINGERS IV SOLN
INTRAVENOUS | Status: DC | PRN
  Administered 2016-12-23: 01:00:00 via INTRAVENOUS

## 2016-12-23 MED ORDER — EPHEDRINE SULFATE 50 MG/ML IJ SOLN
INTRAMUSCULAR | Status: DC | PRN
  Administered 2016-12-23: 10 mg via INTRAVENOUS

## 2016-12-23 MED ORDER — LIDOCAINE 2% (20 MG/ML) 5 ML SYRINGE
INTRAMUSCULAR | Status: AC
  Filled 2016-12-23: qty 5

## 2016-12-23 MED ORDER — MIDAZOLAM HCL 2 MG/2ML IJ SOLN
INTRAMUSCULAR | Status: AC
  Filled 2016-12-23: qty 2

## 2016-12-23 MED ORDER — SODIUM CHLORIDE 0.9 % IR SOLN
Status: DC | PRN
  Administered 2016-12-23: 3000 mL

## 2016-12-23 MED ORDER — DEXAMETHASONE SODIUM PHOSPHATE 10 MG/ML IJ SOLN
INTRAMUSCULAR | Status: AC
  Filled 2016-12-23: qty 1

## 2016-12-23 MED ORDER — SUFENTANIL CITRATE 50 MCG/ML IV SOLN
INTRAVENOUS | Status: DC | PRN
  Administered 2016-12-23 (×2): 10 ug via INTRAVENOUS

## 2016-12-23 MED ORDER — SULFAMETHOXAZOLE-TRIMETHOPRIM 800-160 MG PO TABS: 1.0000 | ORAL_TABLET | Freq: Two times a day (BID) | ORAL | 0 refills | Status: DC

## 2016-12-23 MED ORDER — PROPOFOL 10 MG/ML IV BOLUS
INTRAVENOUS | Status: DC | PRN
  Administered 2016-12-23: 150 mg via INTRAVENOUS
  Administered 2016-12-23: 30 mg via INTRAVENOUS

## 2016-12-23 MED ORDER — BUPIVACAINE HCL (PF) 0.25 % IJ SOLN
INTRAMUSCULAR | Status: AC
  Filled 2016-12-23: qty 30

## 2016-12-23 MED ORDER — VANCOMYCIN HCL 1000 MG IV SOLR
INTRAVENOUS | Status: DC | PRN
  Administered 2016-12-23: 1000 mg via INTRAVENOUS

## 2016-12-23 MED ORDER — BUPIVACAINE HCL (PF) 0.25 % IJ SOLN
INTRAMUSCULAR | Status: DC | PRN
  Administered 2016-12-23: 10 mL

## 2016-12-23 MED ORDER — ONDANSETRON HCL 4 MG/2ML IJ SOLN
INTRAMUSCULAR | Status: DC | PRN
  Administered 2016-12-23: 4 mg via INTRAVENOUS

## 2016-12-23 MED ORDER — SUCCINYLCHOLINE CHLORIDE 200 MG/10ML IV SOSY
PREFILLED_SYRINGE | INTRAVENOUS | Status: AC
  Filled 2016-12-23: qty 10

## 2016-12-23 MED ORDER — EPHEDRINE 5 MG/ML INJ
INTRAVENOUS | Status: AC
  Filled 2016-12-23: qty 10

## 2016-12-23 SURGICAL SUPPLY — 56 items
BANDAGE ACE 3X5.8 VEL STRL LF (GAUZE/BANDAGES/DRESSINGS) ×2 IMPLANT
BANDAGE ACE 4X5 VEL STRL LF (GAUZE/BANDAGES/DRESSINGS) ×2 IMPLANT
BANDAGE COBAN STERILE 2 (GAUZE/BANDAGES/DRESSINGS) IMPLANT
BNDG CMPR 9X4 STRL LF SNTH (GAUZE/BANDAGES/DRESSINGS)
BNDG ESMARK 4X9 LF (GAUZE/BANDAGES/DRESSINGS) IMPLANT
BNDG GAUZE ELAST 4 BULKY (GAUZE/BANDAGES/DRESSINGS) ×2 IMPLANT
CORD BIPOLAR FORCEPS 12FT (ELECTRODE) ×1 IMPLANT
CORDS BIPOLAR (ELECTRODE) ×2 IMPLANT
COVER SURGICAL LIGHT HANDLE (MISCELLANEOUS) ×2 IMPLANT
CUFF TOURNIQUET SINGLE 18IN (TOURNIQUET CUFF) ×1 IMPLANT
CUFF TOURNIQUET SINGLE 24IN (TOURNIQUET CUFF) IMPLANT
DECANTER SPIKE VIAL GLASS SM (MISCELLANEOUS) ×2 IMPLANT
DRAIN PENROSE 1/4X12 LTX STRL (WOUND CARE) ×1 IMPLANT
DRSG PAD ABDOMINAL 8X10 ST (GAUZE/BANDAGES/DRESSINGS) ×4 IMPLANT
GAUZE PACKING IODOFORM 1/4X15 (GAUZE/BANDAGES/DRESSINGS) ×1 IMPLANT
GAUZE SPONGE 4X4 12PLY STRL (GAUZE/BANDAGES/DRESSINGS) ×2 IMPLANT
GAUZE XEROFORM 1X8 LF (GAUZE/BANDAGES/DRESSINGS) ×2 IMPLANT
GLOVE BIO SURGEON STRL SZ7 (GLOVE) ×1 IMPLANT
GLOVE BIO SURGEON STRL SZ7.5 (GLOVE) ×3 IMPLANT
GLOVE BIOGEL PI IND STRL 7.5 (GLOVE) IMPLANT
GLOVE BIOGEL PI IND STRL 8 (GLOVE) ×1 IMPLANT
GLOVE BIOGEL PI INDICATOR 7.5 (GLOVE) ×1
GLOVE BIOGEL PI INDICATOR 8 (GLOVE) ×2
GOWN STRL REUS W/ TWL LRG LVL3 (GOWN DISPOSABLE) ×1 IMPLANT
GOWN STRL REUS W/ TWL XL LVL3 (GOWN DISPOSABLE) ×1 IMPLANT
GOWN STRL REUS W/TWL LRG LVL3 (GOWN DISPOSABLE) ×2
GOWN STRL REUS W/TWL XL LVL3 (GOWN DISPOSABLE) ×2
KIT BASIN OR (CUSTOM PROCEDURE TRAY) ×2 IMPLANT
KIT ROOM TURNOVER OR (KITS) ×2 IMPLANT
LOOP VESSEL MAXI BLUE (MISCELLANEOUS) IMPLANT
MANIFOLD NEPTUNE II (INSTRUMENTS) IMPLANT
NDL HYPO 25GX1X1/2 BEV (NEEDLE) IMPLANT
NDL HYPO 25X1 1.5 SAFETY (NEEDLE) IMPLANT
NEEDLE HYPO 25GX1X1/2 BEV (NEEDLE) ×2 IMPLANT
NEEDLE HYPO 25X1 1.5 SAFETY (NEEDLE) IMPLANT
NS IRRIG 1000ML POUR BTL (IV SOLUTION) ×2 IMPLANT
PACK ORTHO EXTREMITY (CUSTOM PROCEDURE TRAY) ×2 IMPLANT
PAD ARMBOARD 7.5X6 YLW CONV (MISCELLANEOUS) ×4 IMPLANT
PAD CAST 4YDX4 CTTN HI CHSV (CAST SUPPLIES) IMPLANT
PADDING CAST COTTON 4X4 STRL (CAST SUPPLIES) ×2
SCRUB BETADINE 4OZ XXX (MISCELLANEOUS) ×2 IMPLANT
SET CYSTO W/LG BORE CLAMP LF (SET/KITS/TRAYS/PACK) ×1 IMPLANT
SOL PREP POV-IOD 4OZ 10% (MISCELLANEOUS) ×2 IMPLANT
SPONGE LAP 4X18 X RAY DECT (DISPOSABLE) ×3 IMPLANT
SUT ETHILON 4 0 P 3 18 (SUTURE) IMPLANT
SUT ETHILON 4 0 PS 2 18 (SUTURE) IMPLANT
SUT MON AB 5-0 P3 18 (SUTURE) IMPLANT
SWAB COLLECTION DEVICE MRSA (MISCELLANEOUS) ×1 IMPLANT
SWAB CULTURE ESWAB REG 1ML (MISCELLANEOUS) IMPLANT
SYR BULB 3OZ (MISCELLANEOUS) ×1 IMPLANT
SYR CONTROL 10ML LL (SYRINGE) ×1 IMPLANT
TOWEL OR 17X26 10 PK STRL BLUE (TOWEL DISPOSABLE) ×2 IMPLANT
TUBE CONNECTING 12X1/4 (SUCTIONS) ×2 IMPLANT
TUBE FEEDING ENTERAL 5FR 16IN (TUBING) IMPLANT
UNDERPAD 30X30 (UNDERPADS AND DIAPERS) ×2 IMPLANT
YANKAUER SUCT BULB TIP NO VENT (SUCTIONS) ×2 IMPLANT

## 2016-12-23 NOTE — Transfer of Care (Signed)
Immediate Anesthesia Transfer of Care Note  Patient: Rebecca Boyle  Procedure(s) Performed: IRRIGATION AND DEBRIDEMENT EXTREMITY (Right Hand)  Patient Location: PACU  Anesthesia Type:General  Level of Consciousness: oriented, drowsy, patient cooperative and responds to stimulation  Airway & Oxygen Therapy: Patient Spontanous Breathing and Patient connected to nasal cannula oxygen  Post-op Assessment: Report given to RN, Post -op Vital signs reviewed and stable and Patient moving all extremities X 4  Post vital signs: Reviewed and stable  Last Vitals:  Vitals:   12/23/16 0030 12/23/16 0227  BP: (!) 91/58 (P) 113/83  Pulse: (!) 57 (P) 70  Resp: 18   Temp:  (P) 36.6 C  SpO2: 96% (P) 100%    Last Pain:  Vitals:   12/23/16 0227  TempSrc:   PainSc: (P) 0-No pain         Complications: No apparent anesthesia complications

## 2016-12-23 NOTE — Op Note (Signed)
NAMAndrey Campanile:  Boyle, Rebecca            ACCOUNT NO.:  [id number]  MEDICAL RECORD NO.:  [id number]  LOCATION:                                 FACILITY:  PHYSICIAN:  Betha LoaKevin Gerry Heaphy, MD             DATE OF BIRTH:  DATE OF PROCEDURE:  12/23/2016 DATE OF DISCHARGE:                              OPERATIVE REPORT   PREOPERATIVE DIAGNOSIS:  Right hand abscess.  POSTOPERATIVE DIAGNOSIS:  Right hand hematoma/abscess.  PROCEDURE:  Right hand incision and drainage of hematoma/abscess.  SURGEON:  Betha LoaKevin Hooper Petteway, MD  ASSISTANT:  None.  ANESTHESIA:  General.  IV FLUIDS:  Per Anesthesia flow sheet.  ESTIMATED BLOOD LOSS:  Minimal.  COMPLICATIONS:  None.  SPECIMENS:  Cultures to Micro.  TOURNIQUET TIME:  23 minutes.  DISPOSITION:  Stable to PACU.  INDICATIONS:  Rebecca Boyle is a 46 year old female who states approximately 5 days ago, she had a right hand slammed in a door.  [facility] where radiographs were taken, revealing no fractures.  She has had progressively increasing swelling, erythema, and pain of the dorsum of the hand.  I recommended incision and drainage in the operating room.  Risks, benefits, and alternatives of surgery were discussed including the risk of blood loss; infection; damage to nerves, vessels, tendons, ligaments, bone; failure of surgery; need for additional surgery; complications with wound healing; continued pain; need for repeat irrigation and debridement.  She voiced understanding of these risks and elected to proceed.  OPERATIVE COURSE:  After being identified preoperatively by myself, the patient and I agreed upon the procedure and site of the procedure. Surgical site was marked.  The risks, benefits, and alternatives of surgery were reviewed and she wished to proceed.  Surgical consent had been signed.  Antibiotics were held for cultures.  She was transported to the operating room and placed on the operating room table in  supine position with the right upper extremity on arm board.  General anesthesia was induced by anesthesiologist.  Right upper extremity was prepped and draped in normal sterile orthopedic fashion.  Surgical pause was performed between surgeons, Anesthesia, and operating room staff; and all were in agreement as to the patient, procedure, and site of procedure.  Tourniquet at the proximal aspect of the extremity was inflated to 250 mmHg after exsanguination of the limb with an Esmarch bandage.  Incision was made on the dorsum of the hand including the traumatic wound.  Gross hematoma was encountered.  There was no gross purulence.  Cultures were taken for aerobes and anaerobes and Gram stain.  The hematoma was evacuated.  The entire cavity was found in cleared of consolidated hematoma.  Bipolar electrocautery was used to obtain hemostasis.  The wound was then copiously irrigated with sterile saline.  There did not appear to be any hematoma or swelling deep to the tendons.  The wound was then packed with quarter-inch iodoform gauze. It was injected with 10 mL of 0.25% plain Marcaine to aid in postoperative analgesia.  It was dressed with sterile 4x4s and wrapped with a Kerlix bandage.  A volar splint was placed and wrapped with Kerlix and Ace  bandage.  Tourniquet was deflated at 23 minutes. Fingertips were pink with brisk capillary refill after deflation of the tourniquet.  The operative drapes were broken down.  The patient was awakened from anesthesia safely.  She was transferred back to the stretcher and taken to the PACU in stable condition.  She was given IV vancomycin after cultures had been taken.  I will see her back in the office in 3 days for postoperative followup.  I will give her a prescription for oxycodone 5 mg 1 to 2 p.o. q.6 hours p.r.n. pain, dispensed #20, and Bactrim DS 1 p.o. b.i.d. x7 days.     Betha LoaKevin Brittinee Risk, MD     KK/MEDQ  D:  12/23/2016  T:  12/23/2016  Job:   [id number]

## 2016-12-23 NOTE — Anesthesia Postprocedure Evaluation (Signed)
Anesthesia Post Note  Patient: Rebecca NailerStephanie H Boyle  Procedure(s) Performed: IRRIGATION AND DEBRIDEMENT EXTREMITY (Right Hand)     Patient location during evaluation: PACU Anesthesia Type: General Level of consciousness: awake and alert Pain management: pain level controlled Vital Signs Assessment: post-procedure vital signs reviewed and stable Respiratory status: spontaneous breathing, nonlabored ventilation and respiratory function stable Cardiovascular status: blood pressure returned to baseline and stable Postop Assessment: no apparent nausea or vomiting Anesthetic complications: no    Last Vitals:  Vitals:   12/23/16 0257 12/23/16 0300  BP: 102/76   Pulse: (!) 59   Resp: 12   Temp:  36.6 C  SpO2: 98%     Last Pain:  Vitals:   12/23/16 0300  TempSrc:   PainSc: 0-No pain                 Keiley Levey,W. EDMOND

## 2016-12-23 NOTE — Anesthesia Procedure Notes (Signed)
Procedure Name: Intubation Date/Time: 12/23/2016 1:37 AM Performed by: Claris Che, CRNA Pre-anesthesia Checklist: Patient identified, Emergency Drugs available, Suction available, Patient being monitored and Timeout performed Patient Re-evaluated:Patient Re-evaluated prior to induction Oxygen Delivery Method: Circle system utilized Preoxygenation: Pre-oxygenation with 100% oxygen Induction Type: IV induction, Rapid sequence and Cricoid Pressure applied Laryngoscope Size: Mac and 3 Grade View: Grade I Tube type: Oral Tube size: 8.0 mm Number of attempts: 1 Airway Equipment and Method: Stylet Placement Confirmation: ETT inserted through vocal cords under direct vision,  positive ETCO2 and breath sounds checked- equal and bilateral Secured at: 22 cm Tube secured with: Tape Dental Injury: Teeth and Oropharynx as per pre-operative assessment

## 2016-12-23 NOTE — ED Notes (Signed)
Pt's belongings removed and given to husband. Belongings included: watch, wedding ring, pants, top, jacket and boots.

## 2016-12-23 NOTE — Discharge Instructions (Signed)

## 2016-12-23 NOTE — Op Note (Signed)
[id number] 

## 2016-12-23 NOTE — Brief Op Note (Signed)
12/23/2016  2:19 AM  PATIENT:  Rebecca NailerStephanie H Boyle  46 y.o. female  PRE-OPERATIVE DIAGNOSIS:  Right hand ABSCESS  POST-OPERATIVE DIAGNOSIS:  Right hand ABSCESS/hematoma  PROCEDURE:  Procedure(s): IRRIGATION AND DEBRIDEMENT EXTREMITY (Right)  SURGEON:  Surgeon(s) and Role:    Betha Loa* Axel Frisk, MD - Primary  PHYSICIAN ASSISTANT:   ASSISTANTS: none   ANESTHESIA:   general  EBL:  0 mL   BLOOD ADMINISTERED:none  DRAINS: iodoform packing  LOCAL MEDICATIONS USED:  MARCAINE     SPECIMEN:  Source of Specimen:  right hand  DISPOSITION OF SPECIMEN:  micro  COUNTS:  YES  TOURNIQUET:   Total Tourniquet Time Documented: Upper Arm (Right) - 23 minutes Total: Upper Arm (Right) - 23 minutes   DICTATION: .Other Dictation: Dictation Number [id number]  PLAN OF CARE: Discharge to home after PACU  PATIENT DISPOSITION:  PACU - hemodynamically stable.
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
## 2016-12-23 NOTE — Anesthesia Preprocedure Evaluation (Signed)
Anesthesia Evaluation  Patient identified by MRN, date of birth, ID band Patient awake    Reviewed: Allergy & Precautions, H&P , NPO status , Patient's Chart, lab work & pertinent test results, reviewed documented beta blocker date and time   History of Anesthesia Complications (+) PONV  Airway Mallampati: II  TM Distance: >3 FB Neck ROM: Full    Dental no notable dental hx. (+) Teeth Intact, Dental Advisory Given   Pulmonary neg pulmonary ROS, former smoker,    Pulmonary exam normal breath sounds clear to auscultation       Cardiovascular hypertension, Pt. on medications and Pt. on home beta blockers + Peripheral Vascular Disease   Rhythm:Regular Rate:Normal     Neuro/Psych  Headaches, Anxiety    GI/Hepatic Neg liver ROS, GERD  Medicated and Controlled,  Endo/Other  negative endocrine ROS  Renal/GU negative Renal ROS  negative genitourinary   Musculoskeletal  (+) Arthritis , Osteoarthritis,    Abdominal   Peds  Hematology negative hematology ROS (+)   Anesthesia Other Findings   Reproductive/Obstetrics negative OB ROS                             Anesthesia Physical Anesthesia Plan  ASA: II  Anesthesia Plan: General   Post-op Pain Management:    Induction: Intravenous  PONV Risk Score and Plan: 4 or greater and Ondansetron, Dexamethasone, Midazolam and Scopolamine patch - Pre-op  Airway Management Planned: Oral ETT  Additional Equipment:   Intra-op Plan:   Post-operative Plan: Extubation in OR  Informed Consent: I have reviewed the patients History and Physical, chart, labs and discussed the procedure including the risks, benefits and alternatives for the proposed anesthesia with the patient or authorized representative who has indicated his/her understanding and acceptance.   Dental advisory given  Plan Discussed with: CRNA  Anesthesia Plan Comments:          Anesthesia Quick Evaluation

## 2016-12-24 ENCOUNTER — Encounter (HOSPITAL_COMMUNITY): Payer: Self-pay | Admitting: Orthopedic Surgery

## 2016-12-28 LAB — AEROBIC/ANAEROBIC CULTURE (SURGICAL/DEEP WOUND)

## 2016-12-28 LAB — AEROBIC/ANAEROBIC CULTURE W GRAM STAIN (SURGICAL/DEEP WOUND): Culture: NO GROWTH

## 2016-12-31 ENCOUNTER — Emergency Department (HOSPITAL_COMMUNITY)
Admission: EM | Admit: 2016-12-31 | Discharge: 2016-12-31 | Disposition: A | Discharge status: Home / Self Care | Payer: BLUE CROSS/BLUE SHIELD | Attending: Emergency Medicine | Admitting: Emergency Medicine

## 2016-12-31 ENCOUNTER — Emergency Department (HOSPITAL_COMMUNITY): Payer: BLUE CROSS/BLUE SHIELD

## 2016-12-31 ENCOUNTER — Encounter (HOSPITAL_COMMUNITY): Payer: Self-pay

## 2016-12-31 DIAGNOSIS — Y33XXXD Other specified events, undetermined intent, subsequent encounter: Secondary | ICD-10-CM | POA: Diagnosis not present

## 2016-12-31 DIAGNOSIS — I10 Essential (primary) hypertension: Secondary | ICD-10-CM | POA: Diagnosis not present

## 2016-12-31 DIAGNOSIS — Z87891 Personal history of nicotine dependence: Secondary | ICD-10-CM | POA: Insufficient documentation

## 2016-12-31 DIAGNOSIS — Z4889 Encounter for other specified surgical aftercare: Secondary | ICD-10-CM | POA: Insufficient documentation

## 2016-12-31 DIAGNOSIS — Z79899 Other long term (current) drug therapy: Secondary | ICD-10-CM | POA: Diagnosis not present

## 2016-12-31 DIAGNOSIS — S6991XD Unspecified injury of right wrist, hand and finger(s), subsequent encounter: Secondary | ICD-10-CM | POA: Diagnosis not present

## 2016-12-31 MED ORDER — HYDROMORPHONE HCL 1 MG/ML IJ SOLN
1.0000 mg | Freq: Once | INTRAMUSCULAR | Status: AC
  Administered 2016-12-31: 1 mg via INTRAMUSCULAR
  Filled 2016-12-31: qty 1

## 2016-12-31 MED ORDER — ONDANSETRON 4 MG PO TBDP
4.0000 mg | ORAL_TABLET | Freq: Once | ORAL | Status: AC
  Administered 2016-12-31: 4 mg via ORAL
  Filled 2016-12-31: qty 1

## 2016-12-31 MED ORDER — OXYCODONE HCL 5 MG PO CAPS: 5.0000 mg | ORAL_CAPSULE | ORAL | 0 refills | Status: DC | PRN

## 2016-12-31 MED ORDER — LIDOCAINE HCL 2 % IJ SOLN
5.0000 mL | Freq: Once | INTRAMUSCULAR | Status: AC
  Administered 2016-12-31: 100 mg
  Filled 2016-12-31: qty 20

## 2016-12-31 NOTE — ED Triage Notes (Signed)
Patient had surgery with Kuzma to right hand 1 week ago. Had packing changed this past Wednesday at [facility] and now reports that she hit hand yesterday and wants hand checked, dressing not removed at triage.

## 2016-12-31 NOTE — ED Notes (Signed)
See providers notes

## 2016-12-31 NOTE — Discharge Instructions (Signed)
Call Dr. Merrilee SeashoreKuzma's office tomorrow for follow up.

## 2016-12-31 NOTE — ED Notes (Signed)
Called in waiting area, no response
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
## 2017-01-04 ENCOUNTER — Other Ambulatory Visit: Payer: Self-pay | Admitting: *Deleted

## 2017-01-04 DIAGNOSIS — I10 Essential (primary) hypertension: Secondary | ICD-10-CM

## 2017-01-04 MED ORDER — HYDROCHLOROTHIAZIDE 25 MG PO TABS: 25.0000 mg | ORAL_TABLET | Freq: Every day | ORAL | 0 refills | Status: DC

## 2017-01-09 ENCOUNTER — Ambulatory Visit: Payer: Self-pay | Admitting: Physician Assistant

## 2017-01-24 ENCOUNTER — Other Ambulatory Visit: Payer: Self-pay | Admitting: *Deleted

## 2017-01-24 MED ORDER — METOPROLOL SUCCINATE ER 100 MG PO TB24: 100.0000 mg | ORAL_TABLET | Freq: Every day | ORAL | 0 refills | Status: DC

## 2017-01-31 ENCOUNTER — Emergency Department (HOSPITAL_BASED_OUTPATIENT_CLINIC_OR_DEPARTMENT_OTHER)
Admission: EM | Admit: 2017-01-31 | Discharge: 2017-01-31 | Disposition: A | Discharge status: Home / Self Care | Payer: BLUE CROSS/BLUE SHIELD | Attending: Emergency Medicine | Admitting: Emergency Medicine

## 2017-01-31 ENCOUNTER — Emergency Department (HOSPITAL_BASED_OUTPATIENT_CLINIC_OR_DEPARTMENT_OTHER): Payer: BLUE CROSS/BLUE SHIELD

## 2017-01-31 ENCOUNTER — Encounter (HOSPITAL_BASED_OUTPATIENT_CLINIC_OR_DEPARTMENT_OTHER): Payer: Self-pay | Admitting: Emergency Medicine

## 2017-01-31 ENCOUNTER — Other Ambulatory Visit: Payer: Self-pay

## 2017-01-31 DIAGNOSIS — Z87891 Personal history of nicotine dependence: Secondary | ICD-10-CM | POA: Diagnosis not present

## 2017-01-31 DIAGNOSIS — W000XXA Fall on same level due to ice and snow, initial encounter: Secondary | ICD-10-CM | POA: Diagnosis not present

## 2017-01-31 DIAGNOSIS — Y9289 Other specified places as the place of occurrence of the external cause: Secondary | ICD-10-CM | POA: Diagnosis not present

## 2017-01-31 DIAGNOSIS — Y999 Unspecified external cause status: Secondary | ICD-10-CM | POA: Insufficient documentation

## 2017-01-31 DIAGNOSIS — Z79899 Other long term (current) drug therapy: Secondary | ICD-10-CM | POA: Diagnosis not present

## 2017-01-31 DIAGNOSIS — S82831A Other fracture of upper and lower end of right fibula, initial encounter for closed fracture: Secondary | ICD-10-CM

## 2017-01-31 DIAGNOSIS — Y9389 Activity, other specified: Secondary | ICD-10-CM | POA: Diagnosis not present

## 2017-01-31 DIAGNOSIS — I10 Essential (primary) hypertension: Secondary | ICD-10-CM | POA: Diagnosis not present

## 2017-01-31 DIAGNOSIS — S82832A Other fracture of upper and lower end of left fibula, initial encounter for closed fracture: Secondary | ICD-10-CM | POA: Diagnosis not present

## 2017-01-31 DIAGNOSIS — S99912A Unspecified injury of left ankle, initial encounter: Secondary | ICD-10-CM | POA: Diagnosis present

## 2017-01-31 MED ORDER — LIDOCAINE 5 % EX PTCH: 1.0000 | MEDICATED_PATCH | CUTANEOUS | 0 refills | Status: DC

## 2017-01-31 MED ORDER — OXYCODONE HCL 5 MG PO TABS: 5.0000 mg | ORAL_TABLET | Freq: Four times a day (QID) | ORAL | 0 refills | Status: DC | PRN

## 2017-01-31 MED ORDER — OXYCODONE HCL 5 MG PO TABS
5.0000 mg | ORAL_TABLET | ORAL | Status: AC
Start: 2017-01-31 — End: 2017-01-31
  Administered 2017-01-31: 5 mg via ORAL
  Filled 2017-01-31: qty 1
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
## 2017-01-31 NOTE — ED Notes (Signed)
Pt discharged to home with friend. NAD.  

## 2017-01-31 NOTE — ED Triage Notes (Signed)
Pt presents with c/o left leg pain after she fell 2 weeks ago and has tried ice compression at home without relief.

## 2017-02-01 ENCOUNTER — Other Ambulatory Visit: Payer: Self-pay | Admitting: Family Medicine

## 2017-02-01 DIAGNOSIS — I10 Essential (primary) hypertension: Secondary | ICD-10-CM

## 2017-02-01 MED ORDER — AMLODIPINE BESYLATE 5 MG PO TABS: 5.0000 mg | ORAL_TABLET | Freq: Every day | ORAL | 1 refills | Status: DC

## 2017-02-01 NOTE — Telephone Encounter (Signed)
Copied from CRM [id number]. Topic: Quick Communication - See Telephone Encounter >> Feb 01, 2017  5:12 PM Everardo PacificMoton, Abdurahman Rugg, [place] wrote: CRM for notification. See Telephone encounter for: Patient calling because she needs a refill on her Amlodipine. If someone could give her a call back at [phone]. Patient has been in contact with her pharmacy as well  02/01/17.

## 2017-02-13 ENCOUNTER — Encounter (HOSPITAL_COMMUNITY): Payer: Self-pay

## 2017-02-13 ENCOUNTER — Emergency Department (HOSPITAL_COMMUNITY): Payer: BLUE CROSS/BLUE SHIELD

## 2017-02-13 ENCOUNTER — Emergency Department (HOSPITAL_COMMUNITY)
Admission: EM | Admit: 2017-02-13 | Discharge: 2017-02-13 | Disposition: A | Discharge status: Home / Self Care | Payer: BLUE CROSS/BLUE SHIELD | Attending: Emergency Medicine | Admitting: Emergency Medicine

## 2017-02-13 ENCOUNTER — Other Ambulatory Visit: Payer: Self-pay

## 2017-02-13 DIAGNOSIS — Y33XXXD Other specified events, undetermined intent, subsequent encounter: Secondary | ICD-10-CM | POA: Insufficient documentation

## 2017-02-13 DIAGNOSIS — I10 Essential (primary) hypertension: Secondary | ICD-10-CM | POA: Diagnosis not present

## 2017-02-13 DIAGNOSIS — S8265XG Nondisplaced fracture of lateral malleolus of left fibula, subsequent encounter for closed fracture with delayed healing: Secondary | ICD-10-CM | POA: Diagnosis not present

## 2017-02-13 DIAGNOSIS — Z87891 Personal history of nicotine dependence: Secondary | ICD-10-CM | POA: Insufficient documentation

## 2017-02-13 DIAGNOSIS — Z79899 Other long term (current) drug therapy: Secondary | ICD-10-CM | POA: Diagnosis not present

## 2017-02-13 DIAGNOSIS — S8992XD Unspecified injury of left lower leg, subsequent encounter: Secondary | ICD-10-CM | POA: Diagnosis present

## 2017-02-13 MED ORDER — OXYCODONE HCL 5 MG PO TABS: 5.0000 mg | ORAL_TABLET | Freq: Four times a day (QID) | ORAL | 0 refills | Status: DC | PRN

## 2017-02-13 NOTE — ED Triage Notes (Signed)
Pt states that she recently broke her L ankle, fell on ice the other day, pt thinks she  injured her ankle again

## 2017-02-13 NOTE — Discharge Instructions (Signed)
Please read instructions below. Apply ice to your ankle for 20 minutes at a time. Elevate it when possible. Do not bear weight on your foot until you have been seen by the orthopedic specialist. Return to the ER for new or concerning symptoms.

## 2017-02-13 NOTE — ED Notes (Signed)
Pt back from x-ray.
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
## 2017-02-21 ENCOUNTER — Encounter: Payer: Self-pay | Admitting: Family Medicine

## 2017-02-21 ENCOUNTER — Ambulatory Visit: Payer: BLUE CROSS/BLUE SHIELD | Admitting: Family Medicine

## 2017-02-21 VITALS — BP 99/64 | HR 78 | Temp 97.4°F | Ht 67.0 in | Wt 151.0 lb

## 2017-02-21 DIAGNOSIS — S8265XG Nondisplaced fracture of lateral malleolus of left fibula, subsequent encounter for closed fracture with delayed healing: Secondary | ICD-10-CM | POA: Diagnosis not present

## 2017-02-21 DIAGNOSIS — S82892A Other fracture of left lower leg, initial encounter for closed fracture: Secondary | ICD-10-CM | POA: Diagnosis not present

## 2017-02-21 DIAGNOSIS — F4310 Post-traumatic stress disorder, unspecified: Secondary | ICD-10-CM | POA: Diagnosis not present

## 2017-02-21 DIAGNOSIS — S6721XA Crushing injury of right hand, initial encounter: Secondary | ICD-10-CM

## 2017-02-21 DIAGNOSIS — Z0001 Encounter for general adult medical examination with abnormal findings: Secondary | ICD-10-CM

## 2017-02-21 DIAGNOSIS — I10 Essential (primary) hypertension: Secondary | ICD-10-CM | POA: Diagnosis not present

## 2017-02-21 DIAGNOSIS — M329 Systemic lupus erythematosus, unspecified: Secondary | ICD-10-CM | POA: Diagnosis not present

## 2017-02-21 LAB — BAYER DCA HB A1C WAIVED: HB A1C: 5.7 % (ref <7.0)

## 2017-02-21 MED ORDER — METOPROLOL SUCCINATE ER 100 MG PO TB24: 100.0000 mg | ORAL_TABLET | Freq: Every day | ORAL | 1 refills | Status: DC

## 2017-02-21 MED ORDER — HYDROCHLOROTHIAZIDE 25 MG PO TABS: 25.0000 mg | ORAL_TABLET | Freq: Every day | ORAL | 1 refills | Status: AC

## 2017-02-21 MED ORDER — AMLODIPINE BESYLATE 5 MG PO TABS: 5.0000 mg | ORAL_TABLET | Freq: Every day | ORAL | 1 refills | Status: AC

## 2017-02-21 MED ORDER — INDOMETHACIN 50 MG PO CAPS: 50.0000 mg | ORAL_CAPSULE | Freq: Three times a day (TID) | ORAL | 1 refills | Status: DC

## 2017-02-21 MED ORDER — PANTOPRAZOLE SODIUM 40 MG PO TBEC: 40.0000 mg | DELAYED_RELEASE_TABLET | Freq: Every day | ORAL | 1 refills | Status: DC
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
## 2017-02-21 NOTE — Patient Instructions (Signed)
Use Debrox drops as directed on the package for ear wax build up

## 2017-02-22 LAB — CMP14+EGFR
A/G RATIO: 1.8 (ref 1.2–2.2)
ALK PHOS: 77 IU/L (ref 39–117)
ALT: 40 IU/L — AB (ref 0–32)
AST: 30 IU/L (ref 0–40)
Albumin: 4.8 g/dL (ref 3.5–5.5)
BUN/Creatinine Ratio: 15 (ref 9–23)
BUN: 13 mg/dL (ref 6–24)
Bilirubin Total: 0.3 mg/dL (ref 0.0–1.2)
CALCIUM: 10.1 mg/dL (ref 8.7–10.2)
CO2: 26 mmol/L (ref 20–29)
CREATININE: 0.84 mg/dL (ref 0.57–1.00)
Chloride: 99 mmol/L (ref 96–106)
GFR calc Af Amer: 96 mL/min/{1.73_m2} (ref >59)
GFR calc non Af Amer: 83 mL/min/{1.73_m2} (ref >59)
GLOBULIN, TOTAL: 2.7 g/dL (ref 1.5–4.5)
Glucose: 88 mg/dL (ref 65–99)
POTASSIUM: 4.5 mmol/L (ref 3.5–5.2)
SODIUM: 143 mmol/L (ref 134–144)
Total Protein: 7.5 g/dL (ref 6.0–8.5)

## 2017-02-22 LAB — LIPID PANEL
CHOLESTEROL TOTAL: 159 mg/dL (ref 100–199)
Chol/HDL Ratio: 3.8 ratio (ref 0.0–4.4)
HDL: 42 mg/dL (ref >39)
LDL Calculated: 99 mg/dL (ref 0–99)
TRIGLYCERIDES: 89 mg/dL (ref 0–149)
VLDL Cholesterol Cal: 18 mg/dL (ref 5–40)

## 2017-02-22 LAB — CBC WITH DIFFERENTIAL/PLATELET
Basophils Absolute: 0 10*3/uL (ref 0.0–0.2)
Basos: 0 %
EOS (ABSOLUTE): 0 10*3/uL (ref 0.0–0.4)
EOS: 0 %
HEMATOCRIT: 41 % (ref 34.0–46.6)
Hemoglobin: 13.7 g/dL (ref 11.1–15.9)
IMMATURE GRANULOCYTES: 0 %
Immature Grans (Abs): 0 10*3/uL (ref 0.0–0.1)
LYMPHS ABS: 3.3 10*3/uL — AB (ref 0.7–3.1)
Lymphs: 46 %
MCH: 30.9 pg (ref 26.6–33.0)
MCHC: 33.4 g/dL (ref 31.5–35.7)
MCV: 92 fL (ref 79–97)
MONOS ABS: 0.7 10*3/uL (ref 0.1–0.9)
Monocytes: 10 %
NEUTROS PCT: 44 %
Neutrophils Absolute: 3.2 10*3/uL (ref 1.4–7.0)
Platelets: 266 10*3/uL (ref 150–379)
RBC: 4.44 x10E6/uL (ref 3.77–5.28)
RDW: 13.8 % (ref 12.3–15.4)
WBC: 7.3 10*3/uL (ref 3.4–10.8)

## 2017-02-23 ENCOUNTER — Other Ambulatory Visit: Payer: Self-pay

## 2017-02-23 ENCOUNTER — Emergency Department (HOSPITAL_BASED_OUTPATIENT_CLINIC_OR_DEPARTMENT_OTHER): Payer: BLUE CROSS/BLUE SHIELD

## 2017-02-23 ENCOUNTER — Emergency Department (HOSPITAL_BASED_OUTPATIENT_CLINIC_OR_DEPARTMENT_OTHER)
Admission: EM | Admit: 2017-02-23 | Discharge: 2017-02-23 | Disposition: A | Discharge status: Home / Self Care | Payer: BLUE CROSS/BLUE SHIELD | Attending: Emergency Medicine | Admitting: Emergency Medicine

## 2017-02-23 ENCOUNTER — Encounter (HOSPITAL_BASED_OUTPATIENT_CLINIC_OR_DEPARTMENT_OTHER): Payer: Self-pay

## 2017-02-23 DIAGNOSIS — Z79899 Other long term (current) drug therapy: Secondary | ICD-10-CM | POA: Diagnosis not present

## 2017-02-23 DIAGNOSIS — M25572 Pain in left ankle and joints of left foot: Secondary | ICD-10-CM | POA: Insufficient documentation

## 2017-02-23 DIAGNOSIS — M3210 Systemic lupus erythematosus, organ or system involvement unspecified: Secondary | ICD-10-CM | POA: Insufficient documentation

## 2017-02-23 DIAGNOSIS — I10 Essential (primary) hypertension: Secondary | ICD-10-CM | POA: Diagnosis not present

## 2017-02-23 NOTE — Discharge Instructions (Signed)
Please follow up with the orthopedic team as scheduled or sooner as needed

## 2017-02-23 NOTE — ED Notes (Signed)
Pt discharged to home with family. NAD.  

## 2017-02-23 NOTE — ED Triage Notes (Signed)
Pt has a known fx to L fibula and is in a [employer] Walker. Pt ran on it tonight and feel in the grass and heard a pop in the L foot. Pt now has increased pain in L ankle.
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
## 2017-02-25 ENCOUNTER — Ambulatory Visit (HOSPITAL_COMMUNITY)
Admission: EM | Admit: 2017-02-25 | Discharge: 2017-02-25 | Disposition: A | Discharge status: Home / Self Care | Payer: BLUE CROSS/BLUE SHIELD | Attending: Family Medicine | Admitting: Family Medicine

## 2017-02-25 ENCOUNTER — Encounter (HOSPITAL_COMMUNITY): Payer: Self-pay | Admitting: Family Medicine

## 2017-02-25 DIAGNOSIS — J069 Acute upper respiratory infection, unspecified: Secondary | ICD-10-CM | POA: Diagnosis not present

## 2017-02-25 DIAGNOSIS — B9789 Other viral agents as the cause of diseases classified elsewhere: Secondary | ICD-10-CM

## 2017-02-25 DIAGNOSIS — J40 Bronchitis, not specified as acute or chronic: Secondary | ICD-10-CM

## 2017-02-25 MED ORDER — FLUTICASONE-SALMETEROL 100-50 MCG/DOSE IN AEPB: 1.0000 | INHALATION_SPRAY | Freq: Two times a day (BID) | RESPIRATORY_TRACT | 0 refills | Status: DC | PRN

## 2017-02-25 MED ORDER — HYDROCOD POLST-CPM POLST ER 10-8 MG/5ML PO SUER: 5.0000 mL | Freq: Two times a day (BID) | ORAL | 0 refills | Status: DC | PRN
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
## 2017-02-25 NOTE — ED Triage Notes (Signed)
PT reports fever, chills, cough, SOB for 1 week. PT reports she has been on bedrest recently for a broken leg.

## 2017-02-25 NOTE — Discharge Instructions (Signed)
Continue the rescue inhaler as needed

## 2017-03-01 ENCOUNTER — Telehealth: Payer: Self-pay | Admitting: Family Medicine

## 2017-03-07 NOTE — Telephone Encounter (Signed)
Called pt on 03/01/17  And 03/07/17, mailbox full. Labs mailed

## 2017-03-20 ENCOUNTER — Encounter (HOSPITAL_COMMUNITY): Payer: Self-pay | Admitting: Emergency Medicine

## 2017-03-20 ENCOUNTER — Ambulatory Visit (HOSPITAL_COMMUNITY)
Admission: EM | Admit: 2017-03-20 | Discharge: 2017-03-20 | Disposition: A | Discharge status: Home / Self Care | Payer: BLUE CROSS/BLUE SHIELD | Attending: Emergency Medicine | Admitting: Emergency Medicine

## 2017-03-20 DIAGNOSIS — R05 Cough: Secondary | ICD-10-CM | POA: Diagnosis not present

## 2017-03-20 DIAGNOSIS — J40 Bronchitis, not specified as acute or chronic: Secondary | ICD-10-CM | POA: Diagnosis not present

## 2017-03-20 DIAGNOSIS — J069 Acute upper respiratory infection, unspecified: Secondary | ICD-10-CM | POA: Diagnosis not present

## 2017-03-20 DIAGNOSIS — R059 Cough, unspecified: Secondary | ICD-10-CM

## 2017-03-20 MED ORDER — DEXTROMETHORPHAN HBR 15 MG/5ML PO SYRP: 10.0000 mL | ORAL_SOLUTION | Freq: Four times a day (QID) | ORAL | 0 refills | Status: DC | PRN

## 2017-03-20 MED ORDER — AZITHROMYCIN 250 MG PO TABS: 250.0000 mg | ORAL_TABLET | Freq: Every day | ORAL | 0 refills | Status: DC

## 2017-03-20 NOTE — Discharge Instructions (Signed)
Cont to use the inhaler as needed Use a humidifier at night  Take motrin or tylenol as needed for fever

## 2017-03-20 NOTE — ED Triage Notes (Signed)
PT reports cough, fevers at night for 3 weeks.
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
## 2017-03-21 ENCOUNTER — Ambulatory Visit: Payer: BLUE CROSS/BLUE SHIELD | Admitting: Family Medicine

## 2017-03-22 ENCOUNTER — Encounter: Payer: Self-pay | Admitting: Family Medicine

## 2017-04-12 ENCOUNTER — Emergency Department (HOSPITAL_COMMUNITY)
Admission: EM | Admit: 2017-04-12 | Discharge: 2017-04-12 | Disposition: A | Discharge status: Home / Self Care | Payer: BLUE CROSS/BLUE SHIELD | Attending: Emergency Medicine | Admitting: Emergency Medicine

## 2017-04-12 ENCOUNTER — Encounter (HOSPITAL_COMMUNITY): Payer: Self-pay | Admitting: Emergency Medicine

## 2017-04-12 ENCOUNTER — Emergency Department (HOSPITAL_COMMUNITY): Payer: BLUE CROSS/BLUE SHIELD

## 2017-04-12 ENCOUNTER — Other Ambulatory Visit: Payer: Self-pay

## 2017-04-12 DIAGNOSIS — Y92009 Unspecified place in unspecified non-institutional (private) residence as the place of occurrence of the external cause: Secondary | ICD-10-CM | POA: Insufficient documentation

## 2017-04-12 DIAGNOSIS — Z79899 Other long term (current) drug therapy: Secondary | ICD-10-CM | POA: Insufficient documentation

## 2017-04-12 DIAGNOSIS — F419 Anxiety disorder, unspecified: Secondary | ICD-10-CM | POA: Insufficient documentation

## 2017-04-12 DIAGNOSIS — I10 Essential (primary) hypertension: Secondary | ICD-10-CM | POA: Insufficient documentation

## 2017-04-12 DIAGNOSIS — Z9049 Acquired absence of other specified parts of digestive tract: Secondary | ICD-10-CM | POA: Insufficient documentation

## 2017-04-12 DIAGNOSIS — Z87891 Personal history of nicotine dependence: Secondary | ICD-10-CM | POA: Insufficient documentation

## 2017-04-12 DIAGNOSIS — F1120 Opioid dependence, uncomplicated: Secondary | ICD-10-CM | POA: Insufficient documentation

## 2017-04-12 DIAGNOSIS — Y9389 Activity, other specified: Secondary | ICD-10-CM | POA: Insufficient documentation

## 2017-04-12 DIAGNOSIS — W01190A Fall on same level from slipping, tripping and stumbling with subsequent striking against furniture, initial encounter: Secondary | ICD-10-CM | POA: Insufficient documentation

## 2017-04-12 DIAGNOSIS — Y998 Other external cause status: Secondary | ICD-10-CM | POA: Insufficient documentation

## 2017-04-12 DIAGNOSIS — T1490XA Injury, unspecified, initial encounter: Secondary | ICD-10-CM

## 2017-04-12 DIAGNOSIS — S20212A Contusion of left front wall of thorax, initial encounter: Secondary | ICD-10-CM

## 2017-04-12 DIAGNOSIS — F329 Major depressive disorder, single episode, unspecified: Secondary | ICD-10-CM | POA: Insufficient documentation

## 2017-04-12 LAB — COMPREHENSIVE METABOLIC PANEL
ALBUMIN: 5.2 g/dL — AB (ref 3.5–5.0)
ALK PHOS: 90 U/L (ref 38–126)
ALT: 167 U/L — AB (ref 14–54)
AST: 61 U/L — AB (ref 15–41)
Anion gap: 13 (ref 5–15)
BILIRUBIN TOTAL: 0.9 mg/dL (ref 0.3–1.2)
BUN: 19 mg/dL (ref 6–20)
CALCIUM: 10.4 mg/dL — AB (ref 8.9–10.3)
CO2: 28 mmol/L (ref 22–32)
CREATININE: 0.79 mg/dL (ref 0.44–1.00)
Chloride: 101 mmol/L (ref 101–111)
GFR calc Af Amer: >60 mL/min (ref >60)
GFR calc non Af Amer: >60 mL/min (ref >60)
GLUCOSE: 96 mg/dL (ref 65–99)
Potassium: 3.5 mmol/L (ref 3.5–5.1)
Sodium: 142 mmol/L (ref 135–145)
Total Protein: 9 g/dL — ABNORMAL HIGH (ref 6.5–8.1)

## 2017-04-12 LAB — CBC WITH DIFFERENTIAL/PLATELET
BASOS PCT: 0 %
Basophils Absolute: 0 10*3/uL (ref 0.0–0.1)
EOS ABS: 0 10*3/uL (ref 0.0–0.7)
Eosinophils Relative: 0 %
HEMATOCRIT: 43.5 % (ref 36.0–46.0)
HEMOGLOBIN: 14.2 g/dL (ref 12.0–15.0)
Lymphocytes Relative: 31 %
Lymphs Abs: 2.6 10*3/uL (ref 0.7–4.0)
MCH: 30.7 pg (ref 26.0–34.0)
MCHC: 32.6 g/dL (ref 30.0–36.0)
MCV: 94 fL (ref 78.0–100.0)
MONOS PCT: 7 %
Monocytes Absolute: 0.6 10*3/uL (ref 0.1–1.0)
NEUTROS ABS: 5.3 10*3/uL (ref 1.7–7.7)
NEUTROS PCT: 62 %
Platelets: 214 10*3/uL (ref 150–400)
RBC: 4.63 MIL/uL (ref 3.87–5.11)
RDW: 13.1 % (ref 11.5–15.5)
WBC: 8.6 10*3/uL (ref 4.0–10.5)

## 2017-04-12 MED ORDER — OXYCODONE HCL 5 MG PO TABS: 5.0000 mg | ORAL_TABLET | ORAL | 0 refills | Status: DC | PRN

## 2017-04-12 MED ORDER — ONDANSETRON HCL 4 MG/2ML IJ SOLN
4.0000 mg | Freq: Once | INTRAMUSCULAR | Status: AC
  Administered 2017-04-12: 4 mg via INTRAVENOUS
  Filled 2017-04-12: qty 2

## 2017-04-12 MED ORDER — MORPHINE SULFATE (PF) 4 MG/ML IV SOLN
4.0000 mg | Freq: Once | INTRAVENOUS | Status: AC
  Administered 2017-04-12: 4 mg via INTRAVENOUS
  Filled 2017-04-12: qty 1

## 2017-04-12 MED ORDER — IOPAMIDOL (ISOVUE-300) INJECTION 61%
100.0000 mL | Freq: Once | INTRAVENOUS | Status: AC | PRN
  Administered 2017-04-12: 100 mL via INTRAVENOUS

## 2017-04-12 NOTE — ED Notes (Signed)
Pt alert & oriented x4, stable gait. Patient given discharge instructions, paperwork & prescription(s). Patient informed not to drive, operate any equipment & handel any important documents 4 hours after taking pain medication. Patient  instructed to stop at the registration desk to finish any additional paperwork. Patient  verbalized understanding. Pt left department w/ no further questions. 

## 2017-04-12 NOTE — ED Triage Notes (Signed)
Pt slipped and fell and landed on the coffee table on her left rib cage.  Pain to left rib cage and abdomen.

## 2017-04-12 NOTE — Discharge Instructions (Signed)
You may alternate ice therapy and heat therapy to your chest wall as discussed. You may take the oxycodone prescribed for pain relief.  This will make you drowsy - do not drive within 4 hours of taking this medication.
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
## 2017-04-19 ENCOUNTER — Encounter (HOSPITAL_COMMUNITY): Payer: Self-pay | Admitting: Emergency Medicine

## 2017-04-19 ENCOUNTER — Other Ambulatory Visit: Payer: Self-pay

## 2017-04-19 ENCOUNTER — Emergency Department (HOSPITAL_COMMUNITY)
Admission: EM | Admit: 2017-04-19 | Discharge: 2017-04-19 | Disposition: A | Discharge status: Home / Self Care | Payer: BLUE CROSS/BLUE SHIELD | Attending: Emergency Medicine | Admitting: Emergency Medicine

## 2017-04-19 ENCOUNTER — Emergency Department (HOSPITAL_COMMUNITY): Payer: BLUE CROSS/BLUE SHIELD

## 2017-04-19 DIAGNOSIS — Z79899 Other long term (current) drug therapy: Secondary | ICD-10-CM | POA: Insufficient documentation

## 2017-04-19 DIAGNOSIS — Z87891 Personal history of nicotine dependence: Secondary | ICD-10-CM | POA: Insufficient documentation

## 2017-04-19 DIAGNOSIS — W500XXD Accidental hit or strike by another person, subsequent encounter: Secondary | ICD-10-CM | POA: Insufficient documentation

## 2017-04-19 DIAGNOSIS — I10 Essential (primary) hypertension: Secondary | ICD-10-CM | POA: Insufficient documentation

## 2017-04-19 DIAGNOSIS — S2232XD Fracture of one rib, left side, subsequent encounter for fracture with routine healing: Secondary | ICD-10-CM | POA: Insufficient documentation

## 2017-04-19 MED ORDER — IBUPROFEN 600 MG PO TABS: 600.0000 mg | ORAL_TABLET | Freq: Four times a day (QID) | ORAL | 0 refills | Status: DC | PRN

## 2017-04-19 MED ORDER — OXYCODONE HCL 5 MG PO TABS
5.0000 mg | ORAL_TABLET | Freq: Once | ORAL | Status: AC
  Administered 2017-04-19: 5 mg via ORAL
  Filled 2017-04-19: qty 1
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
## 2017-04-19 NOTE — Discharge Instructions (Addendum)
Your X-ray today shows a possible fracture to the 7th rib.  It's important to take deep breaths and cough several times per day.  You may alternate ice and heat to your chest wall and use your incentive spirometer if possible.  Try to sleep in a recliner.  Follow-up with your doctor for recheck

## 2017-04-19 NOTE — ED Triage Notes (Signed)
Pt was seen for same issue last week and states it has not gotten better. C/o of left rib pain

## 2017-05-21 ENCOUNTER — Emergency Department (HOSPITAL_COMMUNITY): Payer: Self-pay

## 2017-05-21 ENCOUNTER — Other Ambulatory Visit: Payer: Self-pay

## 2017-05-21 ENCOUNTER — Emergency Department (HOSPITAL_COMMUNITY)
Admission: EM | Admit: 2017-05-21 | Discharge: 2017-05-22 | Disposition: A | Discharge status: Home / Self Care | Payer: Self-pay | Attending: Emergency Medicine | Admitting: Emergency Medicine

## 2017-05-21 ENCOUNTER — Encounter (HOSPITAL_COMMUNITY): Payer: Self-pay | Admitting: Emergency Medicine

## 2017-05-21 DIAGNOSIS — Z87891 Personal history of nicotine dependence: Secondary | ICD-10-CM | POA: Insufficient documentation

## 2017-05-21 DIAGNOSIS — I10 Essential (primary) hypertension: Secondary | ICD-10-CM | POA: Insufficient documentation

## 2017-05-21 DIAGNOSIS — Z79899 Other long term (current) drug therapy: Secondary | ICD-10-CM | POA: Insufficient documentation

## 2017-05-21 DIAGNOSIS — R52 Pain, unspecified: Secondary | ICD-10-CM

## 2017-05-21 DIAGNOSIS — M5442 Lumbago with sciatica, left side: Secondary | ICD-10-CM | POA: Insufficient documentation

## 2017-05-21 MED ORDER — OXYCODONE HCL 5 MG PO TABS
5.0000 mg | ORAL_TABLET | Freq: Once | ORAL | Status: AC
  Administered 2017-05-21: 5 mg via ORAL
  Filled 2017-05-21: qty 1
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
## 2017-05-21 NOTE — ED Triage Notes (Signed)
Lower back pain since fall approx 1 month ago

## 2017-05-22 ENCOUNTER — Ambulatory Visit: Payer: BLUE CROSS/BLUE SHIELD | Admitting: Family Medicine

## 2017-05-22 MED ORDER — PREDNISONE 20 MG PO TABS: ORAL_TABLET | ORAL | 0 refills | Status: DC

## 2017-05-22 MED ORDER — OXYCODONE HCL 5 MG PO TABS: 5.0000 mg | ORAL_TABLET | Freq: Every evening | ORAL | 0 refills | Status: DC | PRN

## 2017-05-28 ENCOUNTER — Encounter: Payer: Self-pay | Admitting: *Deleted

## 2017-07-20 ENCOUNTER — Encounter (HOSPITAL_COMMUNITY): Payer: Self-pay | Admitting: *Deleted

## 2017-07-20 ENCOUNTER — Emergency Department (HOSPITAL_COMMUNITY)
Admission: EM | Admit: 2017-07-20 | Discharge: 2017-07-20 | Disposition: A | Discharge status: Home / Self Care | Payer: No Typology Code available for payment source | Attending: Emergency Medicine | Admitting: Emergency Medicine

## 2017-07-20 ENCOUNTER — Emergency Department (HOSPITAL_COMMUNITY): Payer: No Typology Code available for payment source

## 2017-07-20 DIAGNOSIS — I10 Essential (primary) hypertension: Secondary | ICD-10-CM | POA: Diagnosis not present

## 2017-07-20 DIAGNOSIS — Y999 Unspecified external cause status: Secondary | ICD-10-CM | POA: Diagnosis not present

## 2017-07-20 DIAGNOSIS — M542 Cervicalgia: Secondary | ICD-10-CM | POA: Diagnosis not present

## 2017-07-20 DIAGNOSIS — Z87891 Personal history of nicotine dependence: Secondary | ICD-10-CM | POA: Insufficient documentation

## 2017-07-20 DIAGNOSIS — Z79899 Other long term (current) drug therapy: Secondary | ICD-10-CM | POA: Diagnosis not present

## 2017-07-20 DIAGNOSIS — Y939 Activity, unspecified: Secondary | ICD-10-CM | POA: Insufficient documentation

## 2017-07-20 DIAGNOSIS — R079 Chest pain, unspecified: Secondary | ICD-10-CM | POA: Diagnosis not present

## 2017-07-20 LAB — COMPREHENSIVE METABOLIC PANEL
ALBUMIN: 4.5 g/dL (ref 3.5–5.0)
ALT: 33 U/L (ref 14–54)
ANION GAP: 10 (ref 5–15)
AST: 26 U/L (ref 15–41)
Alkaline Phosphatase: 50 U/L (ref 38–126)
BILIRUBIN TOTAL: 1.1 mg/dL (ref 0.3–1.2)
BUN: 18 mg/dL (ref 6–20)
CALCIUM: 9.3 mg/dL (ref 8.9–10.3)
CO2: 23 mmol/L (ref 22–32)
Chloride: 110 mmol/L (ref 101–111)
Creatinine, Ser: 0.77 mg/dL (ref 0.44–1.00)
GFR calc Af Amer: >60 mL/min (ref >60)
GFR calc non Af Amer: >60 mL/min (ref >60)
GLUCOSE: 73 mg/dL (ref 65–99)
POTASSIUM: 3.8 mmol/L (ref 3.5–5.1)
Sodium: 143 mmol/L (ref 135–145)
TOTAL PROTEIN: 7.6 g/dL (ref 6.5–8.1)

## 2017-07-20 LAB — CBC WITH DIFFERENTIAL/PLATELET
BASOS PCT: 0 %
Basophils Absolute: 0 10*3/uL (ref 0.0–0.1)
EOS ABS: 0 10*3/uL (ref 0.0–0.7)
Eosinophils Relative: 0 %
HEMATOCRIT: 37.9 % (ref 36.0–46.0)
Hemoglobin: 12.8 g/dL (ref 12.0–15.0)
LYMPHS ABS: 3.1 10*3/uL (ref 0.7–4.0)
Lymphocytes Relative: 29 %
MCH: 32.6 pg (ref 26.0–34.0)
MCHC: 33.8 g/dL (ref 30.0–36.0)
MCV: 96.4 fL (ref 78.0–100.0)
MONO ABS: 0.7 10*3/uL (ref 0.1–1.0)
MONOS PCT: 7 %
NEUTROS ABS: 6.9 10*3/uL (ref 1.7–7.7)
Neutrophils Relative %: 64 %
Platelets: 203 10*3/uL (ref 150–400)
RBC: 3.93 MIL/uL (ref 3.87–5.11)
RDW: 13.6 % (ref 11.5–15.5)
WBC: 10.8 10*3/uL — ABNORMAL HIGH (ref 4.0–10.5)

## 2017-07-20 LAB — URINALYSIS, ROUTINE W REFLEX MICROSCOPIC
BILIRUBIN URINE: NEGATIVE
GLUCOSE, UA: NEGATIVE mg/dL
Hgb urine dipstick: NEGATIVE
KETONES UR: 5 mg/dL — AB
Leukocytes, UA: NEGATIVE
Nitrite: NEGATIVE
PH: 5 (ref 5.0–8.0)
Protein, ur: NEGATIVE mg/dL
Specific Gravity, Urine: 1.005 (ref 1.005–1.030)

## 2017-07-20 LAB — TROPONIN I: Troponin I: 0.03 ng/mL (ref <0.03)

## 2017-07-20 MED ORDER — OXYCODONE HCL 5 MG PO TABS: 5.0000 mg | ORAL_TABLET | ORAL | 0 refills | Status: AC | PRN

## 2017-07-20 MED ORDER — DIAZEPAM 2 MG PO TABS
2.0000 mg | ORAL_TABLET | Freq: Once | ORAL | Status: AC
  Administered 2017-07-20: 2 mg via ORAL
  Filled 2017-07-20: qty 1

## 2017-07-20 MED ORDER — OXYCODONE HCL 5 MG PO TABS
5.0000 mg | ORAL_TABLET | Freq: Once | ORAL | Status: AC
  Administered 2017-07-20: 5 mg via ORAL
  Filled 2017-07-20: qty 1

## 2017-07-20 MED ORDER — ONDANSETRON HCL 4 MG/2ML IJ SOLN
4.0000 mg | Freq: Once | INTRAMUSCULAR | Status: AC
Start: 2017-07-20 — End: 2017-07-20
  Administered 2017-07-20: 4 mg via INTRAVENOUS
  Filled 2017-07-20: qty 2

## 2017-07-20 MED ORDER — DIAZEPAM 5 MG PO TABS: 5.0000 mg | ORAL_TABLET | Freq: Four times a day (QID) | ORAL | 0 refills | Status: AC | PRN
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
## 2017-07-20 NOTE — ED Notes (Signed)
CRITICAL VALUE ALERT  Critical Value:  Troponin 0.03  Date & Time Notied:  07/20/2017 1924  Provider Notified: mesner  Orders Received/Actions taken: md notified
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
## 2017-07-21 NOTE — ED Provider Notes (Signed)
[place].  [facility]90 alprazolam 2 mg tablets monthly from her pharmacist.  She gets multiple prescriptions for oxycodone 5 mg tablets and amounts varying from 15 to 24 tablets the last was May 4.  We changed her Valium to Flexeril 5 mg tablets 3 times a day number 15 tablets, he did fill her prescription for the narcotic.  Devoria AlbeIva Thana Ramp, MD, Concha PyoFACEP    Jahrell Hamor, MD 07/21/17 [id number]

## 2017-08-24 ENCOUNTER — Other Ambulatory Visit: Payer: Self-pay | Admitting: Family Medicine

## 2017-08-24 NOTE — Telephone Encounter (Signed)
Last seen 01/01/18  

## 2017-09-10 ENCOUNTER — Other Ambulatory Visit: Payer: Self-pay | Admitting: Family Medicine

## 2017-09-10 NOTE — Telephone Encounter (Signed)
Last seen 01/01/18  

## 2017-09-18 MED ORDER — METOPROLOL SUCCINATE ER 100 MG PO TB24: 100.0000 mg | ORAL_TABLET | Freq: Every day | ORAL | 0 refills | Status: AC

## 2017-09-18 NOTE — Addendum Note (Signed)
Addended by: Julious PayerHOLT, Graelyn Bihl D on: 09/18/2017 09:20 AM   Modules accepted: Orders

## 2018-01-05 ENCOUNTER — Emergency Department (HOSPITAL_BASED_OUTPATIENT_CLINIC_OR_DEPARTMENT_OTHER): Payer: Self-pay

## 2018-01-05 ENCOUNTER — Encounter (HOSPITAL_BASED_OUTPATIENT_CLINIC_OR_DEPARTMENT_OTHER): Payer: Self-pay | Admitting: Emergency Medicine

## 2018-01-05 ENCOUNTER — Other Ambulatory Visit: Payer: Self-pay

## 2018-01-05 ENCOUNTER — Emergency Department (HOSPITAL_BASED_OUTPATIENT_CLINIC_OR_DEPARTMENT_OTHER)
Admission: EM | Admit: 2018-01-05 | Discharge: 2018-01-05 | Disposition: A | Discharge status: Home / Self Care | Payer: Self-pay | Attending: Emergency Medicine | Admitting: Emergency Medicine

## 2018-01-05 DIAGNOSIS — F1120 Opioid dependence, uncomplicated: Secondary | ICD-10-CM | POA: Insufficient documentation

## 2018-01-05 DIAGNOSIS — F329 Major depressive disorder, single episode, unspecified: Secondary | ICD-10-CM | POA: Insufficient documentation

## 2018-01-05 DIAGNOSIS — J40 Bronchitis, not specified as acute or chronic: Secondary | ICD-10-CM | POA: Insufficient documentation

## 2018-01-05 DIAGNOSIS — I10 Essential (primary) hypertension: Secondary | ICD-10-CM | POA: Insufficient documentation

## 2018-01-05 DIAGNOSIS — Z79899 Other long term (current) drug therapy: Secondary | ICD-10-CM | POA: Insufficient documentation

## 2018-01-05 DIAGNOSIS — Z87891 Personal history of nicotine dependence: Secondary | ICD-10-CM | POA: Insufficient documentation

## 2018-01-05 DIAGNOSIS — Z9049 Acquired absence of other specified parts of digestive tract: Secondary | ICD-10-CM | POA: Insufficient documentation

## 2018-01-05 DIAGNOSIS — R0603 Acute respiratory distress: Secondary | ICD-10-CM | POA: Insufficient documentation

## 2018-01-05 DIAGNOSIS — F419 Anxiety disorder, unspecified: Secondary | ICD-10-CM | POA: Insufficient documentation

## 2018-01-05 LAB — CBC WITH DIFFERENTIAL/PLATELET
Abs Immature Granulocytes: 0.03 10*3/uL (ref 0.00–0.07)
BASOS PCT: 0 %
Basophils Absolute: 0 10*3/uL (ref 0.0–0.1)
Eosinophils Absolute: 0.1 10*3/uL (ref 0.0–0.5)
Eosinophils Relative: 1 %
HCT: 41.5 % (ref 36.0–46.0)
Hemoglobin: 13.5 g/dL (ref 12.0–15.0)
IMMATURE GRANULOCYTES: 0 %
Lymphocytes Relative: 39 %
Lymphs Abs: 3.2 10*3/uL (ref 0.7–4.0)
MCH: 30.3 pg (ref 26.0–34.0)
MCHC: 32.5 g/dL (ref 30.0–36.0)
MCV: 93.3 fL (ref 80.0–100.0)
Monocytes Absolute: 1 10*3/uL (ref 0.1–1.0)
Monocytes Relative: 12 %
NEUTROS ABS: 4 10*3/uL (ref 1.7–7.7)
NEUTROS PCT: 48 %
PLATELETS: 256 10*3/uL (ref 150–400)
RBC: 4.45 MIL/uL (ref 3.87–5.11)
RDW: 12.9 % (ref 11.5–15.5)
WBC: 8.3 10*3/uL (ref 4.0–10.5)
nRBC: 0 % (ref 0.0–0.2)

## 2018-01-05 LAB — COMPREHENSIVE METABOLIC PANEL
ALBUMIN: 4.3 g/dL (ref 3.5–5.0)
ALT: 27 U/L (ref 0–44)
AST: 22 U/L (ref 15–41)
Alkaline Phosphatase: 52 U/L (ref 38–126)
Anion gap: 9 (ref 5–15)
BUN: 11 mg/dL (ref 6–20)
CHLORIDE: 108 mmol/L (ref 98–111)
CO2: 23 mmol/L (ref 22–32)
Calcium: 9.3 mg/dL (ref 8.9–10.3)
Creatinine, Ser: 0.67 mg/dL (ref 0.44–1.00)
GFR calc Af Amer: >60 mL/min (ref >60)
GFR calc non Af Amer: >60 mL/min (ref >60)
GLUCOSE: 93 mg/dL (ref 70–99)
POTASSIUM: 3.5 mmol/L (ref 3.5–5.1)
SODIUM: 140 mmol/L (ref 135–145)
Total Bilirubin: 0.5 mg/dL (ref 0.3–1.2)
Total Protein: 7.8 g/dL (ref 6.5–8.1)

## 2018-01-05 LAB — I-STAT CG4 LACTIC ACID, ED: Lactic Acid, Venous: 1.38 mmol/L (ref 0.5–1.9)

## 2018-01-05 LAB — PROTIME-INR
INR: 1.03
Prothrombin Time: 13.4 seconds (ref 11.4–15.2)

## 2018-01-05 LAB — BRAIN NATRIURETIC PEPTIDE: B NATRIURETIC PEPTIDE 5: 36.1 pg/mL (ref 0.0–100.0)

## 2018-01-05 LAB — LIPASE, BLOOD: LIPASE: 30 U/L (ref 11–51)

## 2018-01-05 MED ORDER — ALBUTEROL SULFATE HFA 108 (90 BASE) MCG/ACT IN AERS: 1.0000 | INHALATION_SPRAY | Freq: Four times a day (QID) | RESPIRATORY_TRACT | 0 refills | Status: AC | PRN

## 2018-01-05 MED ORDER — BENZONATATE 100 MG PO CAPS: 100.0000 mg | ORAL_CAPSULE | Freq: Three times a day (TID) | ORAL | 0 refills | Status: DC

## 2018-01-05 MED ORDER — ALBUTEROL SULFATE HFA 108 (90 BASE) MCG/ACT IN AERS: 1.0000 | INHALATION_SPRAY | Freq: Four times a day (QID) | RESPIRATORY_TRACT | 0 refills | Status: DC | PRN

## 2018-01-05 MED ORDER — DEXTROMETHORPHAN-GUAIFENESIN 10-100 MG/5ML PO LIQD: 5.0000 mL | ORAL | 0 refills | Status: DC | PRN

## 2018-01-05 MED ORDER — ALBUTEROL SULFATE (2.5 MG/3ML) 0.083% IN NEBU
2.5000 mg | INHALATION_SOLUTION | Freq: Once | RESPIRATORY_TRACT | Status: AC
  Administered 2018-01-05: 2.5 mg via RESPIRATORY_TRACT
  Filled 2018-01-05: qty 3

## 2018-01-05 MED ORDER — DEXTROMETHORPHAN-GUAIFENESIN 10-100 MG/5ML PO LIQD: 5.0000 mL | ORAL | 0 refills | Status: AC | PRN

## 2018-01-05 MED ORDER — HYDROCODONE-HOMATROPINE 5-1.5 MG/5ML PO SYRP
5.0000 mL | ORAL_SOLUTION | Freq: Once | ORAL | Status: DC
  Filled 2018-01-05: qty 5

## 2018-01-05 MED ORDER — BENZONATATE 100 MG PO CAPS: 100.0000 mg | ORAL_CAPSULE | Freq: Three times a day (TID) | ORAL | 0 refills | Status: AC

## 2018-01-05 NOTE — Discharge Instructions (Signed)
1.  Use albuterol inhaler every 4-6 hours for coughing or wheezing. 2.  Use guaifenesin-dextromethorphan cough medication every 4-6 hours as prescribed as needed.  You may also take Tessalon Perles every 8 hours if needed.  Do not chew or suck on these.  Swallow them whole. 3.  Follow-up with your doctor for recheck in the next 3 to 5 days. 4.  You must quit smoking.  This will worsen cough and shortness of breath.  This increases your risk for long-term complications such as emphysema , COPD  and lung cancer.

## 2018-01-05 NOTE — ED Notes (Signed)
MD at bedside. 

## 2018-01-05 NOTE — Progress Notes (Signed)
Patient states that she now able to take a deep breath after nebulizer treatment.
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
## 2018-01-05 NOTE — ED Triage Notes (Signed)
Pt c/o flu-like sx x2 weeks. Hx of lupus and pleurisy. Endorses loss of appetite and insomnia due to coughing.

## 2018-01-13 ENCOUNTER — Emergency Department (HOSPITAL_BASED_OUTPATIENT_CLINIC_OR_DEPARTMENT_OTHER): Payer: Self-pay

## 2018-01-13 ENCOUNTER — Emergency Department (HOSPITAL_BASED_OUTPATIENT_CLINIC_OR_DEPARTMENT_OTHER)
Admission: EM | Admit: 2018-01-13 | Discharge: 2018-01-13 | Disposition: A | Discharge status: Home / Self Care | Payer: Self-pay | Attending: Emergency Medicine | Admitting: Emergency Medicine

## 2018-01-13 ENCOUNTER — Encounter (HOSPITAL_BASED_OUTPATIENT_CLINIC_OR_DEPARTMENT_OTHER): Payer: Self-pay | Admitting: Emergency Medicine

## 2018-01-13 ENCOUNTER — Other Ambulatory Visit: Payer: Self-pay

## 2018-01-13 DIAGNOSIS — Z87891 Personal history of nicotine dependence: Secondary | ICD-10-CM | POA: Insufficient documentation

## 2018-01-13 DIAGNOSIS — W010XXA Fall on same level from slipping, tripping and stumbling without subsequent striking against object, initial encounter: Secondary | ICD-10-CM | POA: Insufficient documentation

## 2018-01-13 DIAGNOSIS — M79644 Pain in right finger(s): Secondary | ICD-10-CM

## 2018-01-13 DIAGNOSIS — R0789 Other chest pain: Secondary | ICD-10-CM | POA: Insufficient documentation

## 2018-01-13 DIAGNOSIS — W19XXXA Unspecified fall, initial encounter: Secondary | ICD-10-CM

## 2018-01-13 DIAGNOSIS — M79641 Pain in right hand: Secondary | ICD-10-CM | POA: Insufficient documentation

## 2018-01-13 DIAGNOSIS — R0781 Pleurodynia: Secondary | ICD-10-CM

## 2018-01-13 DIAGNOSIS — Z79899 Other long term (current) drug therapy: Secondary | ICD-10-CM | POA: Insufficient documentation

## 2018-01-13 DIAGNOSIS — I10 Essential (primary) hypertension: Secondary | ICD-10-CM | POA: Insufficient documentation

## 2018-01-13 MED ORDER — MORPHINE SULFATE (PF) 2 MG/ML IV SOLN
2.0000 mg | Freq: Once | INTRAVENOUS | Status: AC
  Administered 2018-01-13: 2 mg via INTRAMUSCULAR
  Filled 2018-01-13: qty 1

## 2018-01-13 NOTE — ED Notes (Signed)
Ice pack provided

## 2018-01-13 NOTE — ED Notes (Signed)
Patient transported to X-ray 

## 2018-01-13 NOTE — ED Triage Notes (Signed)
Pt states slipped and fell in water on floor and stopped herself with right hand. Has limited movement in right hand.
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
## 2018-01-13 NOTE — Discharge Instructions (Addendum)
Evaluated today for fall.  Your x-rays of your ribs as well as your fingers and right hand did not show any evidence of fracture or dislocation.  It is possible you have a strain or sprain of your fingers.  You could have possibly bruised your ribs.  You stated you have an incentive spirometer at home.  Please use this every 4 hours while awake.  You may also place a a pillow to your ribs when you need to cough or sneeze.  We have buddy tape your fingers.  Please follow-up PCP for reevaluation.  Return to the ED for any new or worsening symptoms.

## 2018-01-13 NOTE — ED Notes (Signed)
ED Provider at bedside. 

## 2018-04-13 ENCOUNTER — Emergency Department (HOSPITAL_BASED_OUTPATIENT_CLINIC_OR_DEPARTMENT_OTHER)
Admission: EM | Admit: 2018-04-13 | Discharge: 2018-04-13 | Disposition: A | Discharge status: Home / Self Care | Payer: HRSA Program | Attending: Emergency Medicine | Admitting: Emergency Medicine

## 2018-04-13 ENCOUNTER — Other Ambulatory Visit: Payer: Self-pay

## 2018-04-13 ENCOUNTER — Encounter (HOSPITAL_BASED_OUTPATIENT_CLINIC_OR_DEPARTMENT_OTHER): Payer: Self-pay

## 2018-04-13 ENCOUNTER — Emergency Department (HOSPITAL_BASED_OUTPATIENT_CLINIC_OR_DEPARTMENT_OTHER): Payer: HRSA Program

## 2018-04-13 DIAGNOSIS — I10 Essential (primary) hypertension: Secondary | ICD-10-CM | POA: Diagnosis not present

## 2018-04-13 DIAGNOSIS — R05 Cough: Secondary | ICD-10-CM | POA: Diagnosis present

## 2018-04-13 DIAGNOSIS — Z20828 Contact with and (suspected) exposure to other viral communicable diseases: Secondary | ICD-10-CM | POA: Insufficient documentation

## 2018-04-13 DIAGNOSIS — J069 Acute upper respiratory infection, unspecified: Secondary | ICD-10-CM | POA: Insufficient documentation

## 2018-04-13 DIAGNOSIS — R059 Cough, unspecified: Secondary | ICD-10-CM

## 2018-04-13 DIAGNOSIS — B9789 Other viral agents as the cause of diseases classified elsewhere: Secondary | ICD-10-CM

## 2018-04-13 DIAGNOSIS — M3210 Systemic lupus erythematosus, organ or system involvement unspecified: Secondary | ICD-10-CM | POA: Diagnosis not present

## 2018-04-13 DIAGNOSIS — Z79899 Other long term (current) drug therapy: Secondary | ICD-10-CM | POA: Insufficient documentation

## 2018-04-13 DIAGNOSIS — Z87891 Personal history of nicotine dependence: Secondary | ICD-10-CM | POA: Diagnosis not present

## 2018-04-13 LAB — RESPIRATORY PANEL BY PCR
Adenovirus: NOT DETECTED
Bordetella pertussis: NOT DETECTED
Chlamydophila pneumoniae: NOT DETECTED
Coronavirus 229E: NOT DETECTED
Coronavirus HKU1: NOT DETECTED
Coronavirus NL63: NOT DETECTED
Coronavirus OC43: NOT DETECTED
Influenza A: NOT DETECTED
Influenza B: NOT DETECTED
METAPNEUMOVIRUS-RVPPCR: NOT DETECTED
Mycoplasma pneumoniae: NOT DETECTED
PARAINFLUENZA VIRUS 2-RVPPCR: NOT DETECTED
Parainfluenza Virus 1: NOT DETECTED
Parainfluenza Virus 3: NOT DETECTED
Parainfluenza Virus 4: NOT DETECTED
Respiratory Syncytial Virus: NOT DETECTED
Rhinovirus / Enterovirus: NOT DETECTED

## 2018-04-13 LAB — INFLUENZA PANEL BY PCR (TYPE A & B)
Influenza A By PCR: NEGATIVE
Influenza B By PCR: NEGATIVE

## 2018-04-13 MED ORDER — IBUPROFEN 800 MG PO TABS
800.0000 mg | ORAL_TABLET | Freq: Once | ORAL | Status: AC
  Administered 2018-04-13: 800 mg via ORAL

## 2018-04-13 MED ORDER — IBUPROFEN 800 MG PO TABS
ORAL_TABLET | ORAL | Status: AC
  Filled 2018-04-13: qty 1

## 2018-04-13 NOTE — ED Triage Notes (Signed)
Pt c/o fever, cough, and ShOB x 2.5 days. Pt states she has a daughter that was in close contact with a PUI in quarantine for COVID19. TMax 102.

## 2018-04-13 NOTE — Discharge Instructions (Addendum)
Stay quarantined as directed until results of testing come back.  Return to the emergency room if you have any worsening symptoms including shortness of breath, vomiting or other worsening symptoms.
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
## 2018-04-13 NOTE — ED Notes (Signed)
Discussed with patient about strict instructions of self quarantine. Also discussed strict return precautions. Pt verbalized understanding and is agreeable to plan  

## 2018-04-22 LAB — NOVEL CORONAVIRUS, NAA (HOSP ORDER, SEND-OUT TO REF LAB; TAT 18-24 HRS): SARS-CoV-2, NAA: NOT DETECTED

## 2018-08-25 ENCOUNTER — Other Ambulatory Visit: Payer: Self-pay | Admitting: Family Medicine

## 2018-10-21 ENCOUNTER — Other Ambulatory Visit: Payer: Self-pay | Admitting: Family Medicine

## 2018-12-05 ENCOUNTER — Emergency Department (HOSPITAL_COMMUNITY)
Admission: EM | Admit: 2018-12-05 | Discharge: 2018-12-05 | Disposition: A | Discharge status: Home / Self Care | Payer: 59 | Attending: Emergency Medicine | Admitting: Emergency Medicine

## 2018-12-05 ENCOUNTER — Encounter (HOSPITAL_COMMUNITY): Payer: Self-pay

## 2018-12-05 ENCOUNTER — Other Ambulatory Visit: Payer: Self-pay

## 2018-12-05 ENCOUNTER — Telehealth: Payer: Self-pay | Admitting: *Deleted

## 2018-12-05 ENCOUNTER — Ambulatory Visit: Payer: Self-pay | Admitting: *Deleted

## 2018-12-05 DIAGNOSIS — R509 Fever, unspecified: Secondary | ICD-10-CM | POA: Diagnosis not present

## 2018-12-05 DIAGNOSIS — R519 Headache, unspecified: Secondary | ICD-10-CM | POA: Insufficient documentation

## 2018-12-05 DIAGNOSIS — R0602 Shortness of breath: Secondary | ICD-10-CM | POA: Insufficient documentation

## 2018-12-05 DIAGNOSIS — Z20828 Contact with and (suspected) exposure to other viral communicable diseases: Secondary | ICD-10-CM | POA: Diagnosis not present

## 2018-12-05 DIAGNOSIS — I10 Essential (primary) hypertension: Secondary | ICD-10-CM | POA: Insufficient documentation

## 2018-12-05 DIAGNOSIS — Z87891 Personal history of nicotine dependence: Secondary | ICD-10-CM | POA: Diagnosis not present

## 2018-12-05 DIAGNOSIS — R05 Cough: Secondary | ICD-10-CM | POA: Insufficient documentation

## 2018-12-05 DIAGNOSIS — J069 Acute upper respiratory infection, unspecified: Secondary | ICD-10-CM

## 2018-12-05 DIAGNOSIS — Z79899 Other long term (current) drug therapy: Secondary | ICD-10-CM | POA: Diagnosis not present

## 2018-12-05 NOTE — Telephone Encounter (Signed)
Pt states she is immunocompromised with lupus and questioning if she should be rapid covid tested.  States household member tested positive yesterday. States "Its going down the line there are many I have been exposed to." questioning if she should go to ED for rapid test. Pt is asymptomatic. Advised to call ED. Aware testing at sites with quicker results recently depending on volume. Advised to monitor for any symptoms and make sure home members are self isolating. All precautions reviewed.Pt verbalizes understanding.

## 2018-12-05 NOTE — Discharge Instructions (Addendum)
Drink plenty of fluids and get plenty of rest.  Tylenol 1000 mg rotated with ibuprofen 600 mg every 4 hours as needed for pain or fever.  Return to the emergency department if you develop severe chest pain, difficulty breathing, or other new and concerning symptoms.
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
## 2018-12-05 NOTE — ED Triage Notes (Signed)
Pt arrives via POV c/o shortness of breath and cough. Pt lives with daughter who tested positive for covid 46. Pt is immunocompromised and called doctors office today and was advised to get tested. Pt is afebrile currently and reports taking 800mg  Ibuprofen 2 hrs ago.

## 2018-12-06 LAB — SARS CORONAVIRUS 2 (TAT 6-24 HRS): SARS Coronavirus 2: NEGATIVE

## 2018-12-23 ENCOUNTER — Other Ambulatory Visit: Payer: Self-pay

## 2018-12-23 ENCOUNTER — Emergency Department (HOSPITAL_BASED_OUTPATIENT_CLINIC_OR_DEPARTMENT_OTHER): Payer: Managed Care, Other (non HMO)

## 2018-12-23 ENCOUNTER — Emergency Department (HOSPITAL_BASED_OUTPATIENT_CLINIC_OR_DEPARTMENT_OTHER)
Admission: EM | Admit: 2018-12-23 | Discharge: 2018-12-23 | Disposition: A | Discharge status: Home / Self Care | Payer: Managed Care, Other (non HMO) | Attending: Emergency Medicine | Admitting: Emergency Medicine

## 2018-12-23 ENCOUNTER — Encounter (HOSPITAL_BASED_OUTPATIENT_CLINIC_OR_DEPARTMENT_OTHER): Payer: Self-pay | Admitting: Emergency Medicine

## 2018-12-23 DIAGNOSIS — I10 Essential (primary) hypertension: Secondary | ICD-10-CM | POA: Insufficient documentation

## 2018-12-23 DIAGNOSIS — W25XXXA Contact with sharp glass, initial encounter: Secondary | ICD-10-CM | POA: Diagnosis not present

## 2018-12-23 DIAGNOSIS — L930 Discoid lupus erythematosus: Secondary | ICD-10-CM | POA: Diagnosis not present

## 2018-12-23 DIAGNOSIS — S61411A Laceration without foreign body of right hand, initial encounter: Secondary | ICD-10-CM | POA: Insufficient documentation

## 2018-12-23 DIAGNOSIS — Y92009 Unspecified place in unspecified non-institutional (private) residence as the place of occurrence of the external cause: Secondary | ICD-10-CM | POA: Insufficient documentation

## 2018-12-23 DIAGNOSIS — S60221A Contusion of right hand, initial encounter: Secondary | ICD-10-CM | POA: Insufficient documentation

## 2018-12-23 DIAGNOSIS — Y998 Other external cause status: Secondary | ICD-10-CM | POA: Insufficient documentation

## 2018-12-23 DIAGNOSIS — Y9389 Activity, other specified: Secondary | ICD-10-CM | POA: Diagnosis not present

## 2018-12-23 DIAGNOSIS — Z79899 Other long term (current) drug therapy: Secondary | ICD-10-CM | POA: Diagnosis not present

## 2018-12-23 DIAGNOSIS — Z87891 Personal history of nicotine dependence: Secondary | ICD-10-CM | POA: Insufficient documentation

## 2018-12-23 MED ORDER — LIDOCAINE-EPINEPHRINE (PF) 2 %-1:200000 IJ SOLN
INTRAMUSCULAR | Status: AC
  Administered 2018-12-23: 10 mL
  Filled 2018-12-23: qty 10

## 2018-12-23 MED ORDER — HYDROCODONE-ACETAMINOPHEN 5-325 MG PO TABS
ORAL_TABLET | ORAL | Status: AC
  Filled 2018-12-23: qty 1

## 2018-12-23 MED ORDER — HYDROCODONE-ACETAMINOPHEN 5-325 MG PO TABS
1.0000 | ORAL_TABLET | Freq: Once | ORAL | Status: AC
  Administered 2018-12-23: 1 via ORAL

## 2018-12-23 MED ORDER — TETANUS-DIPHTH-ACELL PERTUSSIS 5-2.5-18.5 LF-MCG/0.5 IM SUSP: 0.5000 mL | Freq: Once | INTRAMUSCULAR | Status: DC

## 2018-12-23 MED ORDER — ONDANSETRON 4 MG PO TBDP
4.0000 mg | ORAL_TABLET | Freq: Once | ORAL | Status: AC
  Administered 2018-12-23: 4 mg via ORAL
  Filled 2018-12-23: qty 1

## 2018-12-23 MED ORDER — NAPROXEN 250 MG PO TABS
500.0000 mg | ORAL_TABLET | Freq: Once | ORAL | Status: AC
  Administered 2018-12-23: 500 mg via ORAL
  Filled 2018-12-23: qty 2

## 2018-12-23 MED ORDER — LIDOCAINE-EPINEPHRINE 2 %-1:100000 IJ SOLN
20.0000 mL | Freq: Once | INTRAMUSCULAR | Status: DC
  Filled 2018-12-23: qty 20

## 2018-12-23 NOTE — Discharge Instructions (Addendum)
You were seen today for a laceration and contusion to the right hand.  You need to have stitches removed in approximately 1 week.  Do not submerge.  Keep protected with antibiotic ointment and as needed bandage.
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
## 2018-12-23 NOTE — ED Triage Notes (Signed)
Reports trying to close an old window a couple of hours ago when she busted through the glass.  Small lac noted to right hand.  Also reports possible glass in the ring finger of that hand.

## 2019-06-18 ENCOUNTER — Other Ambulatory Visit: Payer: Self-pay | Admitting: Neurological Surgery

## 2019-06-18 DIAGNOSIS — R413 Other amnesia: Secondary | ICD-10-CM

## 2019-07-09 ENCOUNTER — Encounter: Payer: Self-pay | Admitting: Gastroenterology

## 2019-09-01 ENCOUNTER — Ambulatory Visit: Payer: 59 | Admitting: Cardiology

## 2019-09-27 IMAGING — CT CT ANGIO CHEST
2 of 8 series · 19 of 36 positions shown · IV contrast (isovue)
Comparison: CT 10/21/2016, radiographs 05/22/2016

CLINICAL DATA: Right chest pain for 4 days.

EXAM:
CT ANGIOGRAPHY CHEST WITH CONTRAST
TECHNIQUE: Multidetector CT imaging of the chest was performed using the
standard protocol during bolus administration of intravenous
contrast. Multiplanar CT image reconstructions and MIPs were
obtained to evaluate the vascular anatomy.
CONTRAST:  100 mL Isovue 370 intravenous

[Series 6: pe thins · axial · 0.72mm/px · z∈[-113,+129]mm · 18 of 272 slices shown]
[im 15/272  lung]
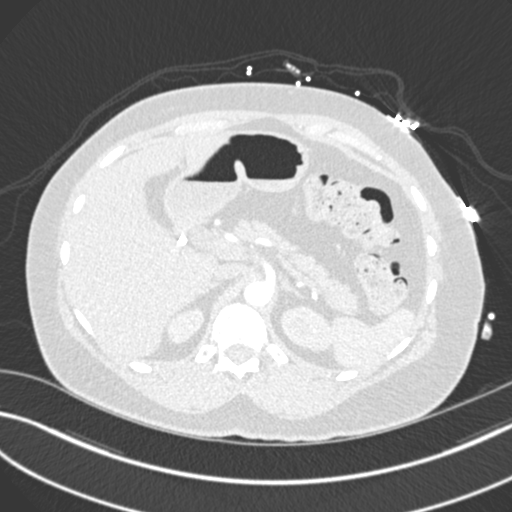
[im 29/272  mediastinal]
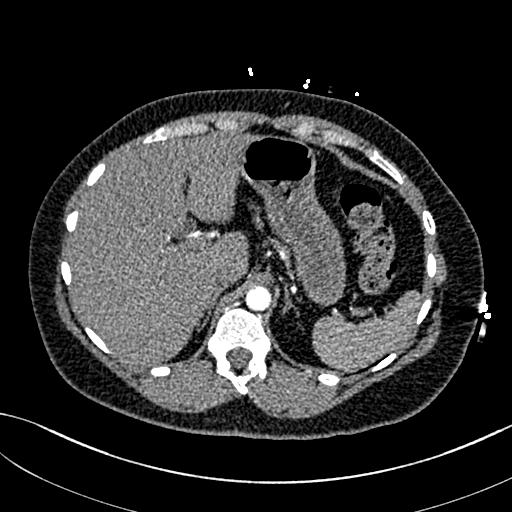
[im 43/272  lung]
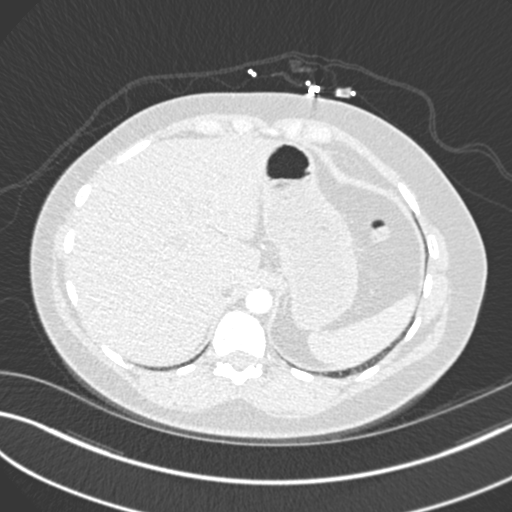
[im 58/272  mediastinal]
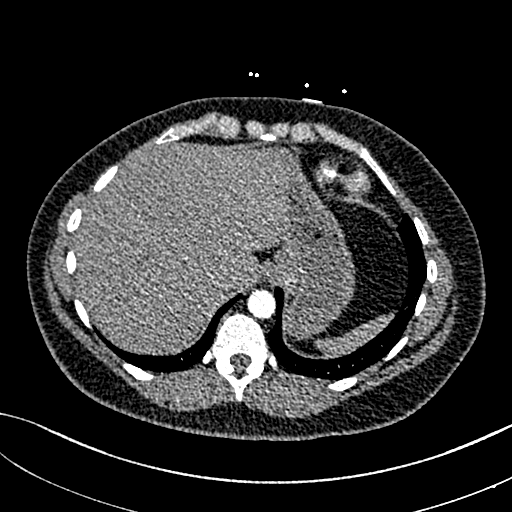
[im 72/272  lung]
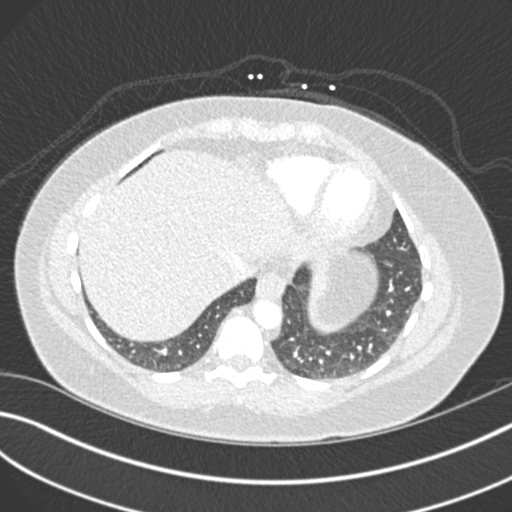
[im 86/272  mediastinal]
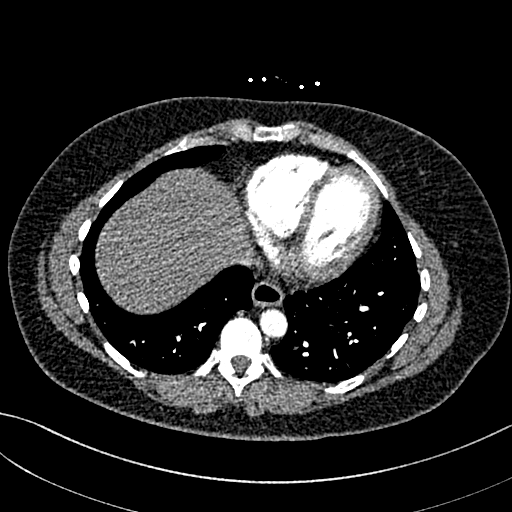
[im 100/272  lung]
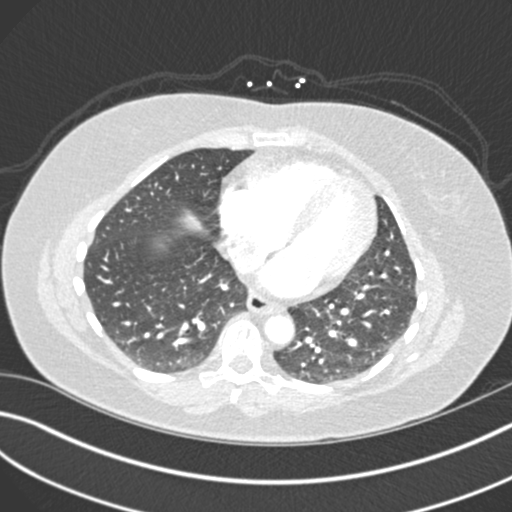
[im 115/272  mediastinal]
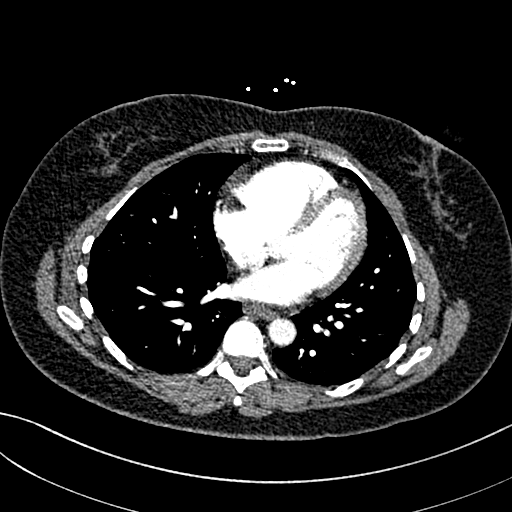
[im 129/272  lung]
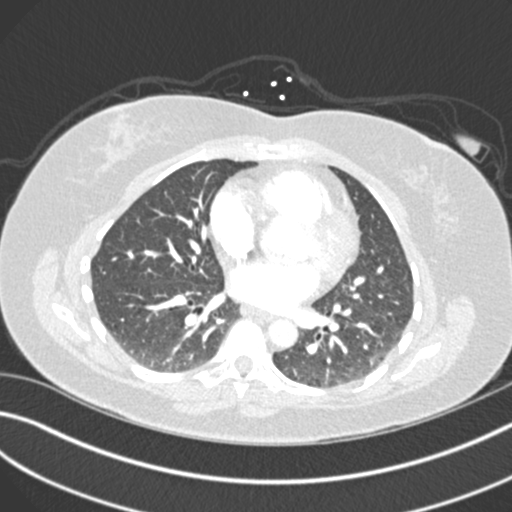
[im 143/272  mediastinal]
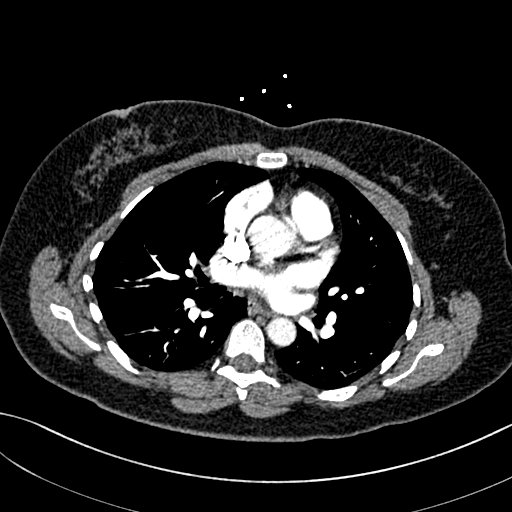
[im 157/272  lung]
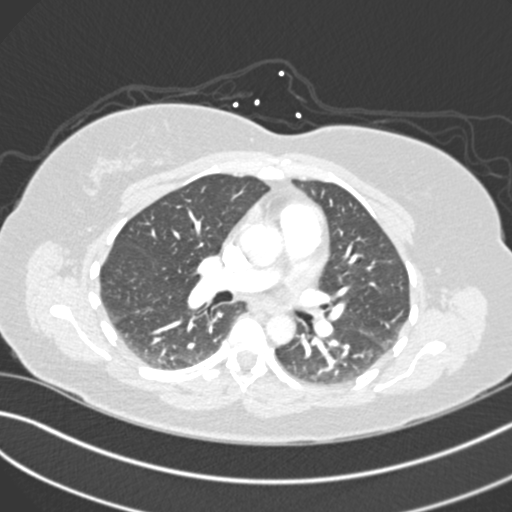
[im 172/272  mediastinal]
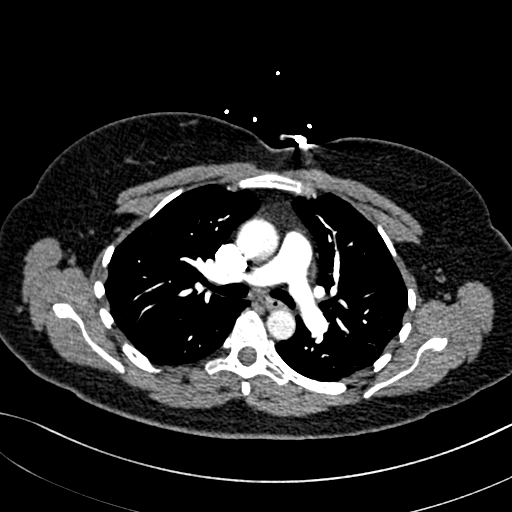
[im 186/272  lung]
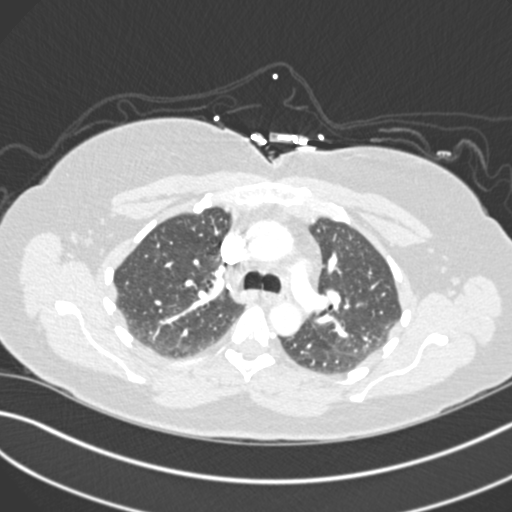
[im 200/272  mediastinal]
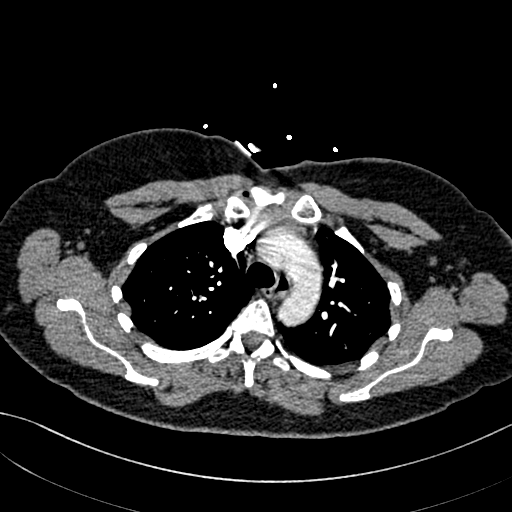
[im 214/272  lung]
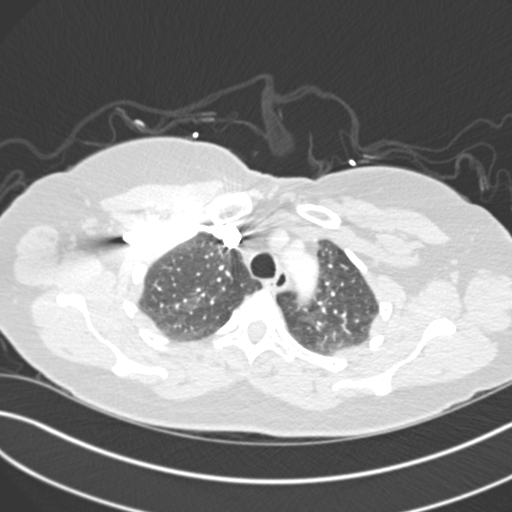
[im 229/272  mediastinal]
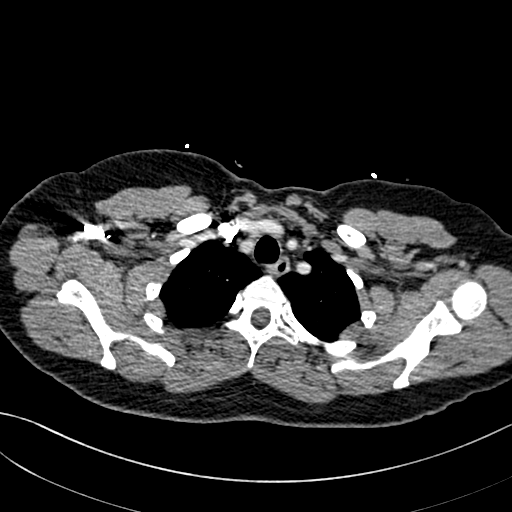
[im 243/272  lung]
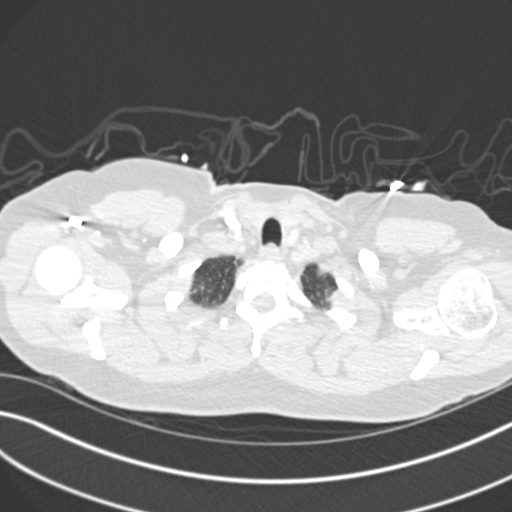
[im 257/272  mediastinal]
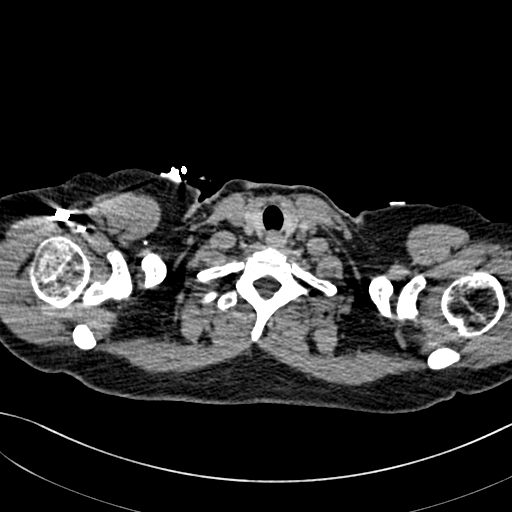

[Series 7: pe coronal mpr · coronal · 0.59mm/px · 1 of 123 slices shown]
[im 62/123  mediastinal]
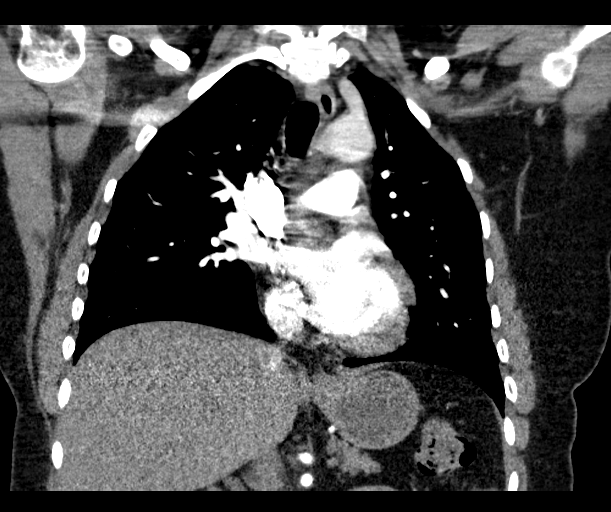

[19 of 36 positions shown; findings below may reference images not displayed]

FINDINGS: Cardiovascular: Satisfactory opacification of the pulmonary arteries
to the segmental level. No evidence of pulmonary embolism. Normal
heart size. No pericardial effusion.

Mediastinum/Nodes: No enlarged mediastinal, hilar, or axillary lymph
nodes. Thyroid gland, trachea, and esophagus demonstrate no
significant findings.

Lungs/Pleura: Lungs are clear. No pleural effusion or pneumothorax.
Tiny ground-glass nodules have resolved since 10/21/2016.

Upper Abdomen: Small hiatal hernia.  No acute findings.

Musculoskeletal: No significant skeletal lesion.

Review of the MIP images confirms the above findings.
IMPRESSION: 1. Negative for acute pulmonary embolism.
2. The lungs are clear. Tiny ground-glass nodules have resolved
since 10/21/2016.
[DATE]. Small hiatal hernia.

## 2019-12-01 ENCOUNTER — Emergency Department (HOSPITAL_BASED_OUTPATIENT_CLINIC_OR_DEPARTMENT_OTHER)
Admission: EM | Admit: 2019-12-01 | Discharge: 2019-12-01 | Disposition: A | Discharge status: Home / Self Care | Payer: Managed Care, Other (non HMO) | Attending: Emergency Medicine | Admitting: Emergency Medicine

## 2019-12-01 ENCOUNTER — Other Ambulatory Visit: Payer: Self-pay

## 2019-12-01 ENCOUNTER — Encounter (HOSPITAL_BASED_OUTPATIENT_CLINIC_OR_DEPARTMENT_OTHER): Payer: Self-pay | Admitting: *Deleted

## 2019-12-01 DIAGNOSIS — Z87891 Personal history of nicotine dependence: Secondary | ICD-10-CM | POA: Diagnosis not present

## 2019-12-01 DIAGNOSIS — F430 Acute stress reaction: Secondary | ICD-10-CM | POA: Diagnosis present

## 2019-12-01 DIAGNOSIS — I10 Essential (primary) hypertension: Secondary | ICD-10-CM | POA: Insufficient documentation

## 2019-12-01 DIAGNOSIS — Z79899 Other long term (current) drug therapy: Secondary | ICD-10-CM | POA: Insufficient documentation

## 2019-12-01 DIAGNOSIS — F4321 Adjustment disorder with depressed mood: Secondary | ICD-10-CM

## 2019-12-01 MED ORDER — ONDANSETRON 4 MG PO TBDP: 4.0000 mg | ORAL_TABLET | Freq: Three times a day (TID) | ORAL | 0 refills | Status: AC | PRN

## 2019-12-01 MED ORDER — OXYCODONE-ACETAMINOPHEN 5-325 MG PO TABS
1.0000 | ORAL_TABLET | Freq: Once | ORAL | Status: AC
  Administered 2019-12-01: 1 via ORAL
  Filled 2019-12-01: qty 1

## 2019-12-01 MED ORDER — ONDANSETRON 4 MG PO TBDP
4.0000 mg | ORAL_TABLET | Freq: Once | ORAL | Status: AC
  Administered 2019-12-01: 4 mg via ORAL
  Filled 2019-12-01: qty 1

## 2019-12-01 NOTE — ED Notes (Signed)
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 Mother pass this morning and family has caused extra stress about family pets. Pt states barely able to move neck and "feels like there's sand in it"
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
## 2021-01-07 ENCOUNTER — Telehealth: Payer: Self-pay

## 2021-01-07 NOTE — Telephone Encounter (Signed)
NOTES SCANNED TO REFERRAL 

## 2021-01-11 ENCOUNTER — Encounter (HOSPITAL_COMMUNITY): Payer: Self-pay | Admitting: Emergency Medicine

## 2021-01-11 ENCOUNTER — Emergency Department (HOSPITAL_COMMUNITY)
Admission: EM | Admit: 2021-01-11 | Discharge: 2021-01-11 | Disposition: A | Discharge status: Home / Self Care | Payer: Managed Care, Other (non HMO) | Attending: Emergency Medicine | Admitting: Emergency Medicine

## 2021-01-11 DIAGNOSIS — Z87891 Personal history of nicotine dependence: Secondary | ICD-10-CM | POA: Diagnosis not present

## 2021-01-11 DIAGNOSIS — R569 Unspecified convulsions: Secondary | ICD-10-CM | POA: Diagnosis present

## 2021-01-11 DIAGNOSIS — Z79899 Other long term (current) drug therapy: Secondary | ICD-10-CM | POA: Insufficient documentation

## 2021-01-11 DIAGNOSIS — I10 Essential (primary) hypertension: Secondary | ICD-10-CM | POA: Insufficient documentation

## 2021-01-11 LAB — COMPREHENSIVE METABOLIC PANEL
ALT: 261 U/L — ABNORMAL HIGH (ref 0–44)
AST: 323 U/L — ABNORMAL HIGH (ref 15–41)
Albumin: 4.1 g/dL (ref 3.5–5.0)
Alkaline Phosphatase: 121 U/L (ref 38–126)
Anion gap: 5 (ref 5–15)
BUN: 21 mg/dL — ABNORMAL HIGH (ref 6–20)
CO2: 24 mmol/L (ref 22–32)
Calcium: 8.9 mg/dL (ref 8.9–10.3)
Chloride: 106 mmol/L (ref 98–111)
Creatinine, Ser: 0.58 mg/dL (ref 0.44–1.00)
GFR, Estimated: >60 mL/min (ref >60)
Glucose, Bld: 91 mg/dL (ref 70–99)
Potassium: 4 mmol/L (ref 3.5–5.1)
Sodium: 135 mmol/L (ref 135–145)
Total Bilirubin: 0.4 mg/dL (ref 0.3–1.2)
Total Protein: 7.7 g/dL (ref 6.5–8.1)

## 2021-01-11 LAB — CBC WITH DIFFERENTIAL/PLATELET
Abs Immature Granulocytes: 0.06 10*3/uL (ref 0.00–0.07)
Basophils Absolute: 0 10*3/uL (ref 0.0–0.1)
Basophils Relative: 0 %
Eosinophils Absolute: 0 10*3/uL (ref 0.0–0.5)
Eosinophils Relative: 1 %
HCT: 37 % (ref 36.0–46.0)
Hemoglobin: 12.1 g/dL (ref 12.0–15.0)
Immature Granulocytes: 1 %
Lymphocytes Relative: 35 %
Lymphs Abs: 1.9 10*3/uL (ref 0.7–4.0)
MCH: 32.4 pg (ref 26.0–34.0)
MCHC: 32.7 g/dL (ref 30.0–36.0)
MCV: 98.9 fL (ref 80.0–100.0)
Monocytes Absolute: 0.5 10*3/uL (ref 0.1–1.0)
Monocytes Relative: 10 %
Neutro Abs: 2.8 10*3/uL (ref 1.7–7.7)
Neutrophils Relative %: 53 %
Platelets: 294 10*3/uL (ref 150–400)
RBC: 3.74 MIL/uL — ABNORMAL LOW (ref 3.87–5.11)
RDW: 13.7 % (ref 11.5–15.5)
WBC: 5.2 10*3/uL (ref 4.0–10.5)
nRBC: 0 % (ref 0.0–0.2)

## 2021-01-11 NOTE — ED Triage Notes (Signed)
Pt here from home with c/o neck stiffness and seizure like activity , pt just started on keppra 3 days ago from [facility] , pt alert and oriented on arrival

## 2021-01-11 NOTE — Discharge Instructions (Signed)
Follow-up with Neurology as scheduled
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
## 2021-09-30 DEATH — deceased
